# Patient Record
Sex: Female | Born: 1965 | Race: Asian | Hispanic: No | Marital: Married | Smoking: Never smoker
Health system: Southern US, Community
[De-identification: ages and names within clinical notes are randomized; demographics above are authoritative.]

## PROBLEM LIST (undated history)

## (undated) DIAGNOSIS — E079 Disorder of thyroid, unspecified: Secondary | ICD-10-CM

## (undated) DIAGNOSIS — E119 Type 2 diabetes mellitus without complications: Secondary | ICD-10-CM

## (undated) DIAGNOSIS — K760 Fatty (change of) liver, not elsewhere classified: Secondary | ICD-10-CM

## (undated) DIAGNOSIS — I10 Essential (primary) hypertension: Secondary | ICD-10-CM

## (undated) HISTORY — PX: UTERINE FIBROID SURGERY: SHX826

## (undated) HISTORY — DX: Fatty (change of) liver, not elsewhere classified: K76.0

## (undated) HISTORY — PX: CERVICAL SPINE SURGERY: SHX589

## (undated) HISTORY — DX: Essential (primary) hypertension: I10

## (undated) HISTORY — DX: Type 2 diabetes mellitus without complications: E11.9

## (undated) HISTORY — DX: Disorder of thyroid, unspecified: E07.9

---

## 1999-01-16 ENCOUNTER — Other Ambulatory Visit: Admission: RE | Admit: 1999-01-16 | Discharge: 1999-01-16 | Payer: Self-pay | Admitting: Obstetrics & Gynecology

## 1999-05-24 ENCOUNTER — Inpatient Hospital Stay (HOSPITAL_COMMUNITY): Admission: AD | Admit: 1999-05-24 | Discharge: 1999-05-24 | Payer: Self-pay | Admitting: Obstetrics & Gynecology

## 1999-05-27 ENCOUNTER — Inpatient Hospital Stay (HOSPITAL_COMMUNITY): Admission: AD | Admit: 1999-05-27 | Discharge: 1999-06-03 | Payer: Self-pay | Admitting: Obstetrics and Gynecology

## 1999-05-28 ENCOUNTER — Encounter: Payer: Self-pay | Admitting: Obstetrics and Gynecology

## 1999-06-19 ENCOUNTER — Encounter: Payer: Self-pay | Admitting: Emergency Medicine

## 1999-06-19 ENCOUNTER — Emergency Department (HOSPITAL_COMMUNITY): Admission: EM | Admit: 1999-06-19 | Discharge: 1999-06-19 | Payer: Self-pay | Admitting: Emergency Medicine

## 1999-12-27 ENCOUNTER — Ambulatory Visit (HOSPITAL_COMMUNITY): Admission: RE | Admit: 1999-12-27 | Discharge: 1999-12-27 | Payer: Self-pay | Admitting: Obstetrics and Gynecology

## 2003-03-13 ENCOUNTER — Other Ambulatory Visit: Admission: RE | Admit: 2003-03-13 | Discharge: 2003-03-13 | Payer: Self-pay | Admitting: Obstetrics & Gynecology

## 2005-01-20 ENCOUNTER — Other Ambulatory Visit: Admission: RE | Admit: 2005-01-20 | Discharge: 2005-01-20 | Payer: Self-pay | Admitting: Obstetrics & Gynecology

## 2005-02-10 ENCOUNTER — Encounter: Admission: RE | Admit: 2005-02-10 | Discharge: 2005-05-11 | Payer: Self-pay | Admitting: Internal Medicine

## 2014-03-20 LAB — HM COLONOSCOPY

## 2014-05-20 LAB — HM PAP SMEAR

## 2014-05-20 LAB — HM MAMMOGRAPHY

## 2014-10-03 LAB — HM COLONOSCOPY

## 2015-03-13 ENCOUNTER — Telehealth: Payer: Self-pay

## 2015-03-13 NOTE — Telephone Encounter (Signed)
Pre visit call completed 

## 2015-03-14 ENCOUNTER — Encounter: Payer: Self-pay | Admitting: Physician Assistant

## 2015-03-14 ENCOUNTER — Ambulatory Visit (INDEPENDENT_AMBULATORY_CARE_PROVIDER_SITE_OTHER): Payer: BLUE CROSS/BLUE SHIELD | Admitting: Physician Assistant

## 2015-03-14 VITALS — BP 150/97 | HR 65 | Temp 97.9°F | Ht 62.5 in | Wt 194.6 lb

## 2015-03-14 DIAGNOSIS — E119 Type 2 diabetes mellitus without complications: Secondary | ICD-10-CM | POA: Diagnosis not present

## 2015-03-14 DIAGNOSIS — R5383 Other fatigue: Secondary | ICD-10-CM | POA: Diagnosis not present

## 2015-03-14 DIAGNOSIS — I10 Essential (primary) hypertension: Secondary | ICD-10-CM

## 2015-03-14 DIAGNOSIS — M2560 Stiffness of unspecified joint, not elsewhere classified: Secondary | ICD-10-CM | POA: Diagnosis not present

## 2015-03-14 DIAGNOSIS — E038 Other specified hypothyroidism: Secondary | ICD-10-CM

## 2015-03-14 LAB — CBC
HCT: 43.6 % (ref 36.0–46.0)
Hemoglobin: 14.8 g/dL (ref 12.0–15.0)
MCHC: 33.9 g/dL (ref 30.0–36.0)
MCV: 84.7 fl (ref 78.0–100.0)
Platelets: 211 10*3/uL (ref 150.0–400.0)
RBC: 5.15 Mil/uL — ABNORMAL HIGH (ref 3.87–5.11)
RDW: 14.9 % (ref 11.5–15.5)
WBC: 9.3 10*3/uL (ref 4.0–10.5)

## 2015-03-14 LAB — LIPID PANEL
Cholesterol: 160 mg/dL (ref 0–200)
HDL: 58.6 mg/dL (ref >39.00)
LDL Cholesterol: 84 mg/dL (ref 0–99)
NonHDL: 101.4
Total CHOL/HDL Ratio: 3
Triglycerides: 86 mg/dL (ref 0.0–149.0)
VLDL: 17.2 mg/dL (ref 0.0–40.0)

## 2015-03-14 LAB — COMPREHENSIVE METABOLIC PANEL
ALT: 30 U/L (ref 0–35)
AST: 25 U/L (ref 0–37)
Albumin: 3.9 g/dL (ref 3.5–5.2)
Alkaline Phosphatase: 56 U/L (ref 39–117)
BUN: 9 mg/dL (ref 6–23)
CO2: 30 mEq/L (ref 19–32)
Calcium: 8.9 mg/dL (ref 8.4–10.5)
Chloride: 102 mEq/L (ref 96–112)
Creatinine, Ser: 0.75 mg/dL (ref 0.40–1.20)
GFR: 86.54 mL/min (ref >60.00)
Glucose, Bld: 102 mg/dL — ABNORMAL HIGH (ref 70–99)
Potassium: 4.4 mEq/L (ref 3.5–5.1)
Sodium: 136 mEq/L (ref 135–145)
Total Bilirubin: 0.7 mg/dL (ref 0.2–1.2)
Total Protein: 7.6 g/dL (ref 6.0–8.3)

## 2015-03-14 LAB — URINALYSIS, ROUTINE W REFLEX MICROSCOPIC
Bilirubin Urine: NEGATIVE
Hgb urine dipstick: NEGATIVE
Ketones, ur: NEGATIVE
Leukocytes, UA: NEGATIVE
Nitrite: NEGATIVE
Specific Gravity, Urine: >=1.03 — AB (ref 1.000–1.030)
Total Protein, Urine: NEGATIVE
Urine Glucose: NEGATIVE
Urobilinogen, UA: 0.2 (ref 0.0–1.0)
pH: 5.5 (ref 5.0–8.0)

## 2015-03-14 LAB — VITAMIN B12: Vitamin B-12: 548 pg/mL (ref 211–911)

## 2015-03-14 LAB — RHEUMATOID FACTOR: Rhuematoid fact SerPl-aCnc: <10 IU/mL (ref <=14)

## 2015-03-14 LAB — VITAMIN D 25 HYDROXY (VIT D DEFICIENCY, FRACTURES): VITD: 20.09 ng/mL — ABNORMAL LOW (ref 30.00–100.00)

## 2015-03-14 LAB — HEMOGLOBIN A1C: Hgb A1c MFr Bld: 5.9 % (ref 4.6–6.5)

## 2015-03-14 LAB — TSH: TSH: 4.92 u[IU]/mL — ABNORMAL HIGH (ref 0.35–4.50)

## 2015-03-14 MED ORDER — METFORMIN HCL 500 MG PO TABS: 500.0000 mg | ORAL_TABLET | Freq: Two times a day (BID) | ORAL | Status: DC

## 2015-03-14 MED ORDER — LISINOPRIL-HYDROCHLOROTHIAZIDE 10-12.5 MG PO TABS: 1.0000 | ORAL_TABLET | Freq: Every day | ORAL | Status: DC

## 2015-03-14 NOTE — Progress Notes (Signed)
Pre visit review using our clinic review tool, if applicable. No additional management support is needed unless otherwise documented below in the visit note. 

## 2015-03-14 NOTE — Patient Instructions (Addendum)
Please go to the lab for blood work.  I will call you with your results. Please increase nighttime Gabapentin dose to 600 mg.  Continue other dosing the same.   We will alter your regimen based on lab results.  Please eat a healthy meal or snack every 4 hours. Stay well hydrated. Exercise when you can.  Diabetes and Exercise Exercising regularly is important. It is not just about losing weight. It has many health benefits, such as:  Improving your overall fitness, flexibility, and endurance.  Increasing your bone density.  Helping with weight control.  Decreasing your body fat.  Increasing your muscle strength.  Reducing stress and tension.  Improving your overall health. People with diabetes who exercise gain additional benefits because exercise:  Reduces appetite.  Improves the body's use of blood sugar (glucose).  Helps lower or control blood glucose.  Decreases blood pressure.  Helps control blood lipids (such as cholesterol and triglycerides).  Improves the body's use of the hormone insulin by:  Increasing the body's insulin sensitivity.  Reducing the body's insulin needs.  Decreases the risk for heart disease because exercising:  Lowers cholesterol and triglycerides levels.  Increases the levels of good cholesterol (such as high-density lipoproteins [HDL]) in the body.  Lowers blood glucose levels. YOUR ACTIVITY PLAN  Choose an activity that you enjoy and set realistic goals. Your health care provider or diabetes educator can help you make an activity plan that works for you. Exercise regularly as directed by your health care provider. This includes:  Performing resistance training twice a week such as push-ups, sit-ups, lifting weights, or using resistance bands.  Performing 150 minutes of cardio exercises each week such as walking, running, or playing sports.  Staying active and spending no more than 90 minutes at one time being inactive. Even short  bursts of exercise are good for you. Three 10-minute sessions spread throughout the day are just as beneficial as a single 30-minute session. Some exercise ideas include:  Taking the dog for a walk.  Taking the stairs instead of the elevator.  Dancing to your favorite song.  Doing an exercise video.  Doing your favorite exercise with a friend. RECOMMENDATIONS FOR EXERCISING WITH TYPE 1 OR TYPE 2 DIABETES   Check your blood glucose before exercising. If blood glucose levels are greater than 240 mg/dL, check for urine ketones. Do not exercise if ketones are present.  Avoid injecting insulin into areas of the body that are going to be exercised. For example, avoid injecting insulin into:  The arms when playing tennis.  The legs when jogging.  Keep a record of:  Food intake before and after you exercise.  Expected peak times of insulin action.  Blood glucose levels before and after you exercise.  The type and amount of exercise you have done.  Review your records with your health care provider. Your health care provider will help you to develop guidelines for adjusting food intake and insulin amounts before and after exercising.  If you take insulin or oral hypoglycemic agents, watch for signs and symptoms of hypoglycemia. They include:  Dizziness.  Shaking.  Sweating.  Chills.  Confusion.  Drink plenty of water while you exercise to prevent dehydration or heat stroke. Body water is lost during exercise and must be replaced.  Talk to your health care provider before starting an exercise program to make sure it is safe for you. Remember, almost any type of activity is better than none. Document Released: 12/27/2003 Document Revised:  02/20/2014 Document Reviewed: 03/15/2013 ExitCare Patient Information 2015 [place] [place]. This information is not intended to replace advice given to you by your health care provider. Make sure you discuss any questions you have with your  health care provider.

## 2015-03-15 MED ORDER — METFORMIN HCL 500 MG PO TABS: 500.0000 mg | ORAL_TABLET | Freq: Every day | ORAL | Status: DC

## 2015-03-15 MED ORDER — LEVOTHYROXINE SODIUM 125 MCG PO TABS: 125.0000 ug | ORAL_TABLET | Freq: Every day | ORAL | Status: DC

## 2015-03-16 ENCOUNTER — Telehealth: Payer: Self-pay | Admitting: *Deleted

## 2015-03-16 DIAGNOSIS — E039 Hypothyroidism, unspecified: Secondary | ICD-10-CM

## 2015-03-16 MED ORDER — GLUCOSE BLOOD VI STRP: ORAL_STRIP | Status: DC

## 2015-03-16 NOTE — Telephone Encounter (Signed)
Called and spoke with the pt and informed her of note below.  Pt verbalized understanding.  Scheduled pt for lab appt on (Friday 05-04-15 @ 8:15am) for repeat TSH, and follow-up appt on (Mon 06-18-15 @ 7:00am).//AB/CMA

## 2015-03-16 NOTE — Telephone Encounter (Signed)
Per Cody-Would recommend we give the new thyroid medication dose a month or so to see if weight starts coming off since underactive thyroid was contributing to weight issues. Will reassess at follow-up.

## 2015-03-19 DIAGNOSIS — M2560 Stiffness of unspecified joint, not elsewhere classified: Secondary | ICD-10-CM | POA: Insufficient documentation

## 2015-03-19 DIAGNOSIS — E038 Other specified hypothyroidism: Secondary | ICD-10-CM | POA: Insufficient documentation

## 2015-03-19 DIAGNOSIS — E119 Type 2 diabetes mellitus without complications: Secondary | ICD-10-CM | POA: Insufficient documentation

## 2015-03-19 DIAGNOSIS — R5383 Other fatigue: Secondary | ICD-10-CM | POA: Insufficient documentation

## 2015-03-19 DIAGNOSIS — I10 Essential (primary) hypertension: Secondary | ICD-10-CM | POA: Insufficient documentation

## 2015-03-19 DIAGNOSIS — E039 Hypothyroidism, unspecified: Secondary | ICD-10-CM | POA: Insufficient documentation

## 2015-03-19 NOTE — Assessment & Plan Note (Signed)
Will obtain CBC and Vitamin D in addition to assessing thyroid status.

## 2015-03-19 NOTE — Assessment & Plan Note (Signed)
With mild neuropathy.  Will check BMP, A1C, UA, B12 level today. Will continue current Metformin regimen until results are obtained. Will increase QHS dosing of Gabapentin to 600 mg. Follow-up with Ophthalmology as scheduled.  Foot exam deferred until next visit.

## 2015-03-19 NOTE — Assessment & Plan Note (Signed)
Will obtain RF, CBC and CMP today.

## 2015-03-19 NOTE — Progress Notes (Signed)
Patient presents to clinic today to establish care.  Patient c/o difficulty with weight loss despite diet and exercise.  Notes this has been a problem over the past 6+ months.  Also endorses mild constipation and dry skin.  Has a history of hypothyroidism, taking levothyroxine 125 mcg daily.  Endorses it has been 1+ years since TSH was evaluated. Would also like to discuss medication options for weight loss.  Chronic Issues: Hypertension -- Endorses well-controlled with lisinopril-HCTZ daily. Patient denies chest pain, palpitations, lightheadedness, dizziness, vision changes or frequent headaches.  Diabetes Mellitus II with neuropathy -- Endorses sugars well controlled with Metformin 500 mg BID. Has been 1+ years since last A1C check.  Is not currently checking fasting CBGs.  Has appointment scheduled with Ophthalmology.  Denies history of diabetic retinopathy or nephropathy.  Endorses neuropathic pain that has worsened over the past few months.  Is taking Gabapentin 300 mg TID.  Past Medical History  Diagnosis Date  . Thyroid disease   . Hypertension   . Diabetes mellitus without complication     Current Outpatient Prescriptions on File Prior to Visit  Medication Sig Dispense Refill  . Calcium Carbonate-Vit D-Min (CALCIUM 1200 PO) Take by mouth.    . gabapentin (NEURONTIN) 300 MG capsule Take 300 mg by mouth 3 (three) times daily.    . Omega-3 Fatty Acids (FISH OIL) 1000 MG CAPS Take by mouth.     No current facility-administered medications on file prior to visit.    No Known Allergies  Family History  Problem Relation Age of Onset  . Kidney disease Mother     Kidney Failure  . Heart disease Father     MI    History   Social History  . Marital Status: Married    Spouse Name: N/A  . Number of Children: N/A  . Years of Education: N/A   Social History Main Topics  . Smoking status: Never Smoker   . Smokeless tobacco: Not on file  . Alcohol Use: Not on file  . Drug  Use: No  . Sexual Activity: Not on file   Other Topics Concern  . None   Social History Narrative   Review of Systems  Constitutional: Negative for fever, weight loss and malaise/fatigue.  HENT: Negative for hearing loss and tinnitus.   Eyes: Negative for blurred vision, double vision, photophobia and pain.  Respiratory: Negative for cough.   Cardiovascular: Negative for chest pain and palpitations.  Musculoskeletal: Positive for joint pain.  Neurological: Positive for tingling. Negative for dizziness, loss of consciousness and headaches.  Psychiatric/Behavioral: Negative for depression, suicidal ideas, hallucinations and substance abuse. The patient is not nervous/anxious and does not have insomnia.    BP 150/97 mmHg  Pulse 65  Temp(Src) 97.9 F (36.6 C) (Oral)  Ht 5' 2.5" (1.588 m)  Wt 194 lb 9.6 oz (88.27 kg)  BMI 35.00 kg/m2  SpO2 100%  Physical Exam  Constitutional: She is oriented to person, place, and time and well-developed, well-nourished, and in no distress.  HENT:  Head: Normocephalic and atraumatic.  Right Ear: External ear normal.  Left Ear: External ear normal.  Nose: Nose normal.  Mouth/Throat: Oropharynx is clear and moist. No oropharyngeal exudate.  TM within normal limits bilaterally.  Eyes: Conjunctivae are normal. Pupils are equal, round, and reactive to light.  Neck: Neck supple. No thyromegaly present.  Cardiovascular: Normal rate, regular rhythm, normal heart sounds and intact distal pulses.   Pulmonary/Chest: Effort normal and breath sounds normal.  No respiratory distress. She has no wheezes. She has no rales. She exhibits no tenderness.  Lymphadenopathy:    She has no cervical adenopathy.  Neurological: She is alert and oriented to person, place, and time. No cranial nerve deficit.  Skin: Skin is warm and dry. No rash noted.  Psychiatric: Affect normal.  Vitals reviewed.   Recent Results (from the past 2160 hour(s))  CBC     Status: Abnormal     Collection Time: 03/14/15  9:19 AM  Result Value Ref Range   WBC 9.3 4.0 - 10.5 K/uL   RBC 5.15 (H) 3.87 - 5.11 Mil/uL   Platelets 211.0 150.0 - 400.0 K/uL   Hemoglobin 14.8 12.0 - 15.0 g/dL   HCT 43.6 36.0 - 46.0 %   MCV 84.7 78.0 - 100.0 fl   MCHC 33.9 30.0 - 36.0 g/dL   RDW 14.9 11.5 - 15.5 %  Hemoglobin A1c     Status: None   Collection Time: 03/14/15  9:19 AM  Result Value Ref Range   Hgb A1c MFr Bld 5.9 4.6 - 6.5 %    Comment: Glycemic Control Guidelines for People with Diabetes:Non Diabetic:  <6%Goal of Therapy: <7%Additional Action Suggested:  >8%   Urinalysis, Routine w reflex microscopic     Status: Abnormal   Collection Time: 03/14/15  9:19 AM  Result Value Ref Range   Color, Urine YELLOW Yellow;Lt. Yellow   APPearance CLEAR Clear   Specific Gravity, Urine >=1.030 (A) 1.000 - 1.030   pH 5.5 5.0 - 8.0   Total Protein, Urine NEGATIVE Negative   Urine Glucose NEGATIVE Negative   Ketones, ur NEGATIVE Negative   Bilirubin Urine NEGATIVE Negative   Hgb urine dipstick NEGATIVE Negative   Urobilinogen, UA 0.2 0.0 - 1.0   Leukocytes, UA NEGATIVE Negative   Nitrite NEGATIVE Negative   RBC / HPF 0-2/hpf 0-2/hpf   Squamous Epithelial / LPF Rare(0-4/hpf) Rare(0-4/hpf)  Rheumatoid Factor     Status: None   Collection Time: 03/14/15  9:19 AM  Result Value Ref Range   Rhuematoid fact SerPl-aCnc <10 <=14 IU/mL    Comment:                            Interpretive Table                     Low Positive: 15 - 41 IU/mL                     High Positive:  >= 42 IU/mL    In addition to the RF result, and clinical symptoms including joint  involvement, the 2010 ACR Classification Criteria for  scoring/diagnosing Rheumatoid Arthritis include the results of the  following tests:  CRP (91478), ESR (15010), and CCP (APCA) (29562).  www.rheumatology.org/practice/clinical/classification/ra/ra_2010.asp   Comprehensive metabolic panel     Status: Abnormal   Collection Time: 03/14/15   9:19 AM  Result Value Ref Range   Sodium 136 135 - 145 mEq/L   Potassium 4.4 3.5 - 5.1 mEq/L   Chloride 102 96 - 112 mEq/L   CO2 30 19 - 32 mEq/L   Glucose, Bld 102 (H) 70 - 99 mg/dL   BUN 9 6 - 23 mg/dL   Creatinine, Ser 0.75 0.40 - 1.20 mg/dL   Total Bilirubin 0.7 0.2 - 1.2 mg/dL   Alkaline Phosphatase 56 39 - 117 U/L   AST 25 0 - 37 U/L  ALT 30 0 - 35 U/L   Total Protein 7.6 6.0 - 8.3 g/dL   Albumin 3.9 3.5 - 5.2 g/dL   Calcium 8.9 8.4 - 10.5 mg/dL   GFR 86.54 >60.00 mL/min  TSH     Status: Abnormal   Collection Time: 03/14/15  9:19 AM  Result Value Ref Range   TSH 4.92 (H) 0.35 - 4.50 uIU/mL  Lipid panel     Status: None   Collection Time: 03/14/15  9:19 AM  Result Value Ref Range   Cholesterol 160 0 - 200 mg/dL    Comment: ATP III Classification       Desirable:  < 200 mg/dL               Borderline High:  200 - 239 mg/dL          High:  > = 240 mg/dL   Triglycerides 86.0 0.0 - 149.0 mg/dL    Comment: Normal:  <150 mg/dLBorderline High:  150 - 199 mg/dL   HDL 58.60 >39.00 mg/dL   VLDL 17.2 0.0 - 40.0 mg/dL   LDL Cholesterol 84 0 - 99 mg/dL   Total CHOL/HDL Ratio 3     Comment:                Men          Women1/2 Average Risk     3.4          3.3Average Risk          5.0          4.42X Average Risk          9.6          7.13X Average Risk          15.0          11.0                       NonHDL 101.40     Comment: NOTE:  Non-HDL goal should be 30 mg/dL higher than patient's LDL goal (i.e. LDL goal of < 70 mg/dL, would have non-HDL goal of < 100 mg/dL)  Vit D  25 hydroxy (rtn osteoporosis monitoring)     Status: Abnormal   Collection Time: 03/14/15  9:19 AM  Result Value Ref Range   VITD 20.09 (L) 30.00 - 100.00 ng/mL  Vitamin B12     Status: None   Collection Time: 03/14/15  9:19 AM  Result Value Ref Range   Vitamin B-12 548 211 - 911 pg/mL    Assessment/Plan: Diabetes mellitus type II, controlled With mild neuropathy.  Will check BMP, A1C, UA, B12 level today.  Will continue current Metformin regimen until results are obtained. Will increase QHS dosing of Gabapentin to 600 mg. Follow-up with Ophthalmology as scheduled.  Foot exam deferred until next visit.   Joint stiffness Will obtain RF, CBC and CMP today.   Essential hypertension Out of medication.  Medications refilled.  DASH diet discussed and handout given.  Will check CMP and Lipid panel today.   Other specified hypothyroidism Patient with current symptoms to suggest her thyroid hormone level is low at current dose of levothyroxine.  Will obtain TSH to assess.  If dose increase needed, will do so and repeat TSH in 6 weeks.  If normal will discuss options to help metabolism and promote weight loss.  Supportive measures discussed with patient.   Other fatigue Will obtain CBC and Vitamin D in  addition to assessing thyroid status.

## 2015-03-19 NOTE — Assessment & Plan Note (Signed)
Out of medication.  Medications refilled.  DASH diet discussed and handout given.  Will check CMP and Lipid panel today.

## 2015-03-19 NOTE — Assessment & Plan Note (Signed)
Patient with current symptoms to suggest her thyroid hormone level is low at current dose of levothyroxine.  Will obtain TSH to assess.  If dose increase needed, will do so and repeat TSH in 6 weeks.  If normal will discuss options to help metabolism and promote weight loss.  Supportive measures discussed with patient.

## 2015-05-04 ENCOUNTER — Other Ambulatory Visit: Payer: BLUE CROSS/BLUE SHIELD

## 2015-05-07 ENCOUNTER — Other Ambulatory Visit (INDEPENDENT_AMBULATORY_CARE_PROVIDER_SITE_OTHER): Payer: BLUE CROSS/BLUE SHIELD

## 2015-05-07 DIAGNOSIS — E039 Hypothyroidism, unspecified: Secondary | ICD-10-CM | POA: Diagnosis not present

## 2015-05-07 LAB — TSH: TSH: 0.09 u[IU]/mL — ABNORMAL LOW (ref 0.35–4.50)

## 2015-05-09 ENCOUNTER — Telehealth: Payer: Self-pay

## 2015-05-09 MED ORDER — LEVOTHYROXINE SODIUM 112 MCG PO TABS: 112.0000 ug | ORAL_TABLET | Freq: Every day | ORAL | Status: DC

## 2015-05-09 NOTE — Telephone Encounter (Signed)
Can discuss phentermine for weight loss but hypertension needs to be well controlled. She needs repeat assessment and BP check before we can start a medication like this.

## 2015-05-09 NOTE — Telephone Encounter (Signed)
Pt notified and verbalized concerns with weight and would like to know if there is something that she can take to help. Please advise.

## 2015-05-09 NOTE — Telephone Encounter (Signed)
-----   Message from Waldon MerlWilliam C Martin, PA-C sent at 05/07/2015  1:00 PM EDT ----- TSH is too low indicating new dose of thyroid medication is likely too much. This is not typical with such a small increase. Want to change dose to 112 mcg tablet daily as her original dose (100 mcg) was not strong enough. Ok to send in Rx with 30 tablet 2 refills. Want to see her in office in 6 weeks.

## 2015-05-14 NOTE — Telephone Encounter (Signed)
Pt informed of instructions appt booked for Fri to recheck BP.

## 2015-05-18 ENCOUNTER — Ambulatory Visit: Payer: BLUE CROSS/BLUE SHIELD | Admitting: Physician Assistant

## 2015-05-21 ENCOUNTER — Ambulatory Visit (INDEPENDENT_AMBULATORY_CARE_PROVIDER_SITE_OTHER): Payer: BLUE CROSS/BLUE SHIELD | Admitting: Medical

## 2015-05-21 ENCOUNTER — Encounter: Payer: Self-pay | Admitting: Medical

## 2015-05-21 VITALS — BP 148/88 | HR 72 | Temp 98.6°F | Ht 62.5 in | Wt 201.6 lb

## 2015-05-21 DIAGNOSIS — E038 Other specified hypothyroidism: Secondary | ICD-10-CM

## 2015-05-21 DIAGNOSIS — I10 Essential (primary) hypertension: Secondary | ICD-10-CM | POA: Diagnosis not present

## 2015-05-21 DIAGNOSIS — E669 Obesity, unspecified: Secondary | ICD-10-CM

## 2015-05-21 MED ORDER — LORCASERIN HCL 10 MG PO TABS: ORAL_TABLET | ORAL | Status: DC

## 2015-05-21 MED ORDER — LEVOTHYROXINE SODIUM 112 MCG PO TABS: 112.0000 ug | ORAL_TABLET | Freq: Every day | ORAL | Status: DC

## 2015-05-21 NOTE — Assessment & Plan Note (Signed)
Will rx her thyroid med at the 112 mcg dose. Give her the printed rx.

## 2015-05-21 NOTE — Assessment & Plan Note (Addendum)
Continue her bp med at same dose. Want her to keep bp log at home. Write numbers down and bring in bp reading from home in  2 wks.

## 2015-05-21 NOTE — Assessment & Plan Note (Signed)
Diet and exercise. Rx belvique. Should see some weight loss by 2 wks. Follow up in 2 wks.

## 2015-05-21 NOTE — Progress Notes (Signed)
Subjective:    Patient ID: Lindsey Owen, female    DOB: August 14, 1966, 49 y.o.   MRN: [id number]  HPI  Pt in for follow up. Pt wants to loose weight. Pt is diabetic. On metformin. Last a1-c was good.  Pt has hx of low thyroid should be on the 112 mcg dose. But that was not called to her pharmacy. So will give print out rx.   Pt has been trying to loose weight and exercise. She did not have success with any diets in past. Pt bmi is 36.4. Pt tried 1500 calorie diet in the past. Pt does walk.   Pt bp is usually 130-80 range at home when she checks. No cardiac or neurologic signs or symptoms.     Review of Systems  Constitutional: Negative for fever, chills, diaphoresis, activity change and fatigue.  Respiratory: Negative for cough, chest tightness and shortness of breath.   Cardiovascular: Negative for chest pain, palpitations and leg swelling.  Gastrointestinal: Negative for nausea, vomiting and abdominal pain.  Musculoskeletal: Negative for neck pain and neck stiffness.  Neurological: Negative for dizziness, tremors, seizures, syncope, facial asymmetry, speech difficulty, weakness, light-headedness, numbness and headaches.  Psychiatric/Behavioral: Negative for behavioral problems, confusion and agitation. The patient is not nervous/anxious.    Past Medical History  Diagnosis Date  . Thyroid disease   . Hypertension   . Diabetes mellitus without complication     History   Social History  . Marital Status: Married    Spouse Name: N/A  . Number of Children: N/A  . Years of Education: N/A   Occupational History  . Not on file.   Social History Main Topics  . Smoking status: Never Smoker   . Smokeless tobacco: Not on file  . Alcohol Use: Not on file  . Drug Use: No  . Sexual Activity: Not on file   Other Topics Concern  . Not on file   Social History Narrative    Past Surgical History  Procedure Laterality Date  . Cesarean section  2000  . Uterine fibroid  surgery      Family History  Problem Relation Age of Onset  . Kidney disease Mother     Kidney Failure  . Heart disease Father     MI    No Known Allergies  Current Outpatient Prescriptions on File Prior to Visit  Medication Sig Dispense Refill  . Calcium Carbonate-Vit D-Min (CALCIUM 1200 PO) Take by mouth.    . gabapentin (NEURONTIN) 300 MG capsule Take 300 mg by mouth 3 (three) times daily.    Marland Kitchen glucose blood test strip CONTOUR BLOOD GLUCOSE TEST STRIP.  USE ONE STRIP TO CHECK GLUCOSE TWICE DAILY. 100 each 12  . levothyroxine (SYNTHROID, LEVOTHROID) 112 MCG tablet Take 1 tablet (112 mcg total) by mouth daily. 30 tablet 2  . lisinopril-hydrochlorothiazide (PRINZIDE,ZESTORETIC) 10-12.5 MG per tablet Take 1 tablet by mouth daily. 90 tablet 1  . metFORMIN (GLUCOPHAGE) 500 MG tablet Take 1 tablet (500 mg total) by mouth daily with breakfast. 90 tablet 1  . Omega-3 Fatty Acids (FISH OIL) 1000 MG CAPS Take by mouth.     No current facility-administered medications on file prior to visit.    BP 135/89 mmHg  Pulse 72  Temp(Src) 98.6 F (37 C) (Oral)  Ht 5' 2.5" (1.588 m)  Wt 201 lb 9.6 oz (91.445 kg)  BMI 36.26 kg/m2  SpO2 99%       Objective:   Physical Exam  General Mental  Status- Alert. General Appearance- Not in acute distress.   Skin General: Color- Normal Color. Moisture- Normal Moisture.  Neck Carotid Arteries- Normal color. Moisture- Normal Moisture. No carotid bruits. No JVD.  Chest and Lung Exam Auscultation: Breath Sounds:-Normal. CTA.  Cardiovascular Auscultation:Rythm- Regular, Rate and Rhythm. Murmurs & Other Heart Sounds:Auscultation of the heart reveals- No Murmurs.  Abdomen Inspection:-Inspeection Normal. Palpation/Percussion:Note:No mass. Palpation and Percussion of the abdomen reveal- Non Tender, Non Distended + BS, no rebound or guarding.    Neurologic Cranial Nerve exam:- CN III-XII intact(No nystagmus), symmetric smile. Strength:- 5/5  equal and symmetric strength both upper and lower extremities.      Assessment & Plan:

## 2015-05-21 NOTE — Progress Notes (Signed)
Pre visit review using our clinic review tool, if applicable. No additional management support is needed unless otherwise documented below in the visit note. 

## 2015-05-21 NOTE — Patient Instructions (Addendum)
Other specified hypothyroidism Will rx her thyroid med at the 112 mcg dose. Give her the printed rx.   Essential hypertension Continue her bp med at same dose. Want her to keep bp log at home. Write numbers down and bring in 2 wks.  Obesity Diet and exercise. Rx belvique. Should see some weight loss by 2 wks. Follow up in 2 wks.    Follow up in 2 wks or as needed

## 2015-05-23 ENCOUNTER — Telehealth: Payer: Self-pay | Admitting: Physician Assistant

## 2015-05-23 NOTE — Telephone Encounter (Signed)
Pt was no show 05/18/15 2:15pm, pt rescheduled and came in 05/21/15 with Ramon Dredge, charge for no show?

## 2015-05-23 NOTE — Telephone Encounter (Signed)
No charge. 

## 2015-05-29 ENCOUNTER — Telehealth: Payer: Self-pay | Admitting: Physician Assistant

## 2015-05-29 NOTE — Telephone Encounter (Signed)
Relation to pt: self  Call back number: [phone] Pharmacy: [facility] [address] [place] - [address] [phone] (Phone) [phone] (Fax)        v  Reason for call:  As per pharmacy medication in need of prior-auth Lorcaserin HCl 10 MG TABS

## 2015-06-18 ENCOUNTER — Encounter: Payer: Self-pay | Admitting: Physician Assistant

## 2015-06-18 ENCOUNTER — Ambulatory Visit: Payer: BLUE CROSS/BLUE SHIELD | Admitting: Physician Assistant

## 2015-06-18 ENCOUNTER — Ambulatory Visit (INDEPENDENT_AMBULATORY_CARE_PROVIDER_SITE_OTHER): Payer: BLUE CROSS/BLUE SHIELD | Admitting: Physician Assistant

## 2015-06-18 VITALS — BP 150/96 | HR 72 | Temp 98.4°F | Resp 16 | Ht 62.5 in | Wt 198.2 lb

## 2015-06-18 DIAGNOSIS — J209 Acute bronchitis, unspecified: Secondary | ICD-10-CM

## 2015-06-18 DIAGNOSIS — H66002 Acute suppurative otitis media without spontaneous rupture of ear drum, left ear: Secondary | ICD-10-CM

## 2015-06-18 DIAGNOSIS — H66009 Acute suppurative otitis media without spontaneous rupture of ear drum, unspecified ear: Secondary | ICD-10-CM | POA: Insufficient documentation

## 2015-06-18 MED ORDER — FLUTICASONE PROPIONATE 50 MCG/ACT NA SUSP: 2.0000 | Freq: Every day | NASAL | Status: DC

## 2015-06-18 MED ORDER — AMOXICILLIN-POT CLAVULANATE 875-125 MG PO TABS: 1.0000 | ORAL_TABLET | Freq: Two times a day (BID) | ORAL | Status: DC

## 2015-06-18 NOTE — Progress Notes (Signed)
Patient presents to clinic today c/o 4 days fo rapidly worsening chest congestion with dry cough, head congestion without sinus pain and significant L ear pain and pressure radiating into jaw. Denies fever, chills, SOB or chest pain. Denies recent travel.   Past Medical History  Diagnosis Date  . Thyroid disease   . Hypertension   . Diabetes mellitus without complication     Current Outpatient Prescriptions on File Prior to Visit  Medication Sig Dispense Refill  . Calcium Carbonate-Vit D-Min (CALCIUM 1200 PO) Take by mouth.    . gabapentin (NEURONTIN) 300 MG capsule Take 300 mg by mouth 3 (three) times daily.    Marland Kitchen glucose blood test strip CONTOUR BLOOD GLUCOSE TEST STRIP.  USE ONE STRIP TO CHECK GLUCOSE TWICE DAILY. 100 each 12  . levothyroxine (SYNTHROID, LEVOTHROID) 112 MCG tablet Take 1 tablet (112 mcg total) by mouth daily. 30 tablet 2  . lisinopril-hydrochlorothiazide (PRINZIDE,ZESTORETIC) 10-12.5 MG per tablet Take 1 tablet by mouth daily. 90 tablet 1  . Lorcaserin HCl 10 MG TABS 1 tab po q day 30 tablet 0  . metFORMIN (GLUCOPHAGE) 500 MG tablet Take 1 tablet (500 mg total) by mouth daily with breakfast. 90 tablet 1  . Omega-3 Fatty Acids (FISH OIL) 1000 MG CAPS Take by mouth.     No current facility-administered medications on file prior to visit.    No Known Allergies  Family History  Problem Relation Age of Onset  . Kidney disease Mother     Kidney Failure  . Heart disease Father     MI    Social History   Social History  . Marital Status: Married    Spouse Name: N/A  . Number of Children: N/A  . Years of Education: N/A   Social History Main Topics  . Smoking status: Never Smoker   . Smokeless tobacco: None  . Alcohol Use: None  . Drug Use: No  . Sexual Activity: Not Asked   Other Topics Concern  . None   Social History Narrative   Review of Systems - See HPI.  All other ROS are negative.  BP 150/96 mmHg  Pulse 72  Temp(Src) 98.4 F (36.9 C)  (Oral)  Resp 16  Ht 5' 2.5" (1.588 m)  Wt 198 lb 4 oz (89.926 kg)  BMI 35.66 kg/m2  SpO2 95%  Physical Exam  Constitutional: She is oriented to person, place, and time and well-developed, well-nourished, and in no distress.  HENT:  Head: Normocephalic and atraumatic.  Right Ear: External ear normal.  Left Ear: External ear normal. Tympanic membrane is erythematous and bulging. A middle ear effusion is present.  Nose: Nose normal.  Mouth/Throat: Oropharynx is clear and moist. No oropharyngeal exudate.  Eyes: Conjunctivae are normal.  Neck: Neck supple.  Cardiovascular: Normal rate, regular rhythm, normal heart sounds and intact distal pulses.   Pulmonary/Chest: Effort normal and breath sounds normal. No respiratory distress. She has no wheezes. She has no rales. She exhibits no tenderness.  Neurological: She is alert and oriented to person, place, and time.  Skin: Skin is warm and dry. No rash noted.  Vitals reviewed.   Recent Results (from the past 2160 hour(s))  TSH     Status: Abnormal   Collection Time: 05/07/15  8:23 AM  Result Value Ref Range   TSH 0.09 (L) 0.35 - 4.50 uIU/mL    Assessment/Plan: Acute bronchitis Likely viral but with bacterial AOM. Rx Augmentin for AOM. Increase fluids. Mucinex-DM. Humidifier in  bedroom and rest.  Acute purulent otitis media Rx Augmentin. Rx Flonase to take daily as directed. Supportive measures reviewed. Follow-up PRN if symptoms are not improving.

## 2015-06-18 NOTE — Assessment & Plan Note (Signed)
Rx Augmentin. Rx Flonase to take daily as directed. Supportive measures reviewed. Follow-up PRN if symptoms are not improving.

## 2015-06-18 NOTE — Patient Instructions (Signed)
Please take the antibiotic as directed. Stay well hydrated and get plenty of rest. Use Mucinex - DM twice daily for congestion and cough. Use Flonase daily as directed. Place a humidifier in the bedroom. Call or return to clinic if symptoms are not resolving.

## 2015-06-18 NOTE — Progress Notes (Signed)
Pre visit review using our clinic review tool, if applicable. No additional management support is needed unless otherwise documented below in the visit note/SLS  

## 2015-06-18 NOTE — Assessment & Plan Note (Signed)
Likely viral but with bacterial AOM. Rx Augmentin for AOM. Increase fluids. Mucinex-DM. Humidifier in bedroom and rest.

## 2015-07-27 ENCOUNTER — Telehealth: Payer: Self-pay | Admitting: Physician Assistant

## 2015-07-27 DIAGNOSIS — E119 Type 2 diabetes mellitus without complications: Secondary | ICD-10-CM

## 2015-07-27 DIAGNOSIS — E038 Other specified hypothyroidism: Secondary | ICD-10-CM

## 2015-07-27 DIAGNOSIS — I10 Essential (primary) hypertension: Secondary | ICD-10-CM

## 2015-07-27 MED ORDER — LISINOPRIL-HYDROCHLOROTHIAZIDE 10-12.5 MG PO TABS: 1.0000 | ORAL_TABLET | Freq: Every day | ORAL | Status: DC

## 2015-07-27 MED ORDER — METFORMIN HCL 500 MG PO TABS: 500.0000 mg | ORAL_TABLET | Freq: Every day | ORAL | Status: DC

## 2015-07-27 MED ORDER — LEVOTHYROXINE SODIUM 112 MCG PO TABS: 112.0000 ug | ORAL_TABLET | Freq: Every day | ORAL | Status: DC

## 2015-07-27 NOTE — Telephone Encounter (Signed)
Please send to [facility] on [address]

## 2015-07-27 NOTE — Telephone Encounter (Signed)
Refills sent in. As far as thyroid medication goes -- she is overdue for repeat TSH to make sure she doesn't need a different dose of medication. Needs to return to lab for TSH to assess.

## 2015-07-27 NOTE — Telephone Encounter (Signed)
Pt called for meds to be refilled early. She said she just had them filled recently and she went out of town and left them at hotel. She called hotel and they told her they didn't find them. She states she needs lisinopril, metformin, and synthroid (she said the one she is taking doesn't work good and asked if there is a different thyroid med she can take).   Best # [phone] She is at hospital with her husband

## 2015-07-31 ENCOUNTER — Other Ambulatory Visit: Payer: BLUE CROSS/BLUE SHIELD

## 2015-07-31 NOTE — Telephone Encounter (Signed)
Patient informed, understood & agreed; lab appt scheduled 10.12.16 at 8:15a; new lab order placed/SLS

## 2015-08-01 ENCOUNTER — Other Ambulatory Visit (INDEPENDENT_AMBULATORY_CARE_PROVIDER_SITE_OTHER): Payer: BLUE CROSS/BLUE SHIELD

## 2015-08-01 DIAGNOSIS — E038 Other specified hypothyroidism: Secondary | ICD-10-CM | POA: Diagnosis not present

## 2015-08-01 LAB — TSH: TSH: 3.48 u[IU]/mL (ref 0.35–4.50)

## 2015-08-24 ENCOUNTER — Ambulatory Visit (INDEPENDENT_AMBULATORY_CARE_PROVIDER_SITE_OTHER)
Admission: RE | Admit: 2015-08-24 | Discharge: 2015-08-24 | Disposition: A | Discharge status: Home / Self Care | Payer: BLUE CROSS/BLUE SHIELD | Source: Ambulatory Visit | Attending: Internal Medicine | Admitting: Internal Medicine

## 2015-08-24 ENCOUNTER — Ambulatory Visit (INDEPENDENT_AMBULATORY_CARE_PROVIDER_SITE_OTHER): Payer: BLUE CROSS/BLUE SHIELD | Admitting: Internal Medicine

## 2015-08-24 ENCOUNTER — Encounter: Payer: Self-pay | Admitting: Internal Medicine

## 2015-08-24 VITALS — BP 150/100 | HR 77 | Wt 203.0 lb

## 2015-08-24 DIAGNOSIS — M25562 Pain in left knee: Secondary | ICD-10-CM | POA: Diagnosis not present

## 2015-08-24 DIAGNOSIS — R0789 Other chest pain: Secondary | ICD-10-CM | POA: Diagnosis not present

## 2015-08-24 DIAGNOSIS — W1809XA Striking against other object with subsequent fall, initial encounter: Secondary | ICD-10-CM

## 2015-08-24 IMAGING — DX DG KNEE COMPLETE 4+V*R*
4 series · 4 of 4 positions shown · non-contrast
Comparison: None.

CLINICAL DATA: Pain and swelling in the right knee after recent
fall.

EXAM:
RIGHT KNEE - COMPLETE 4+ VIEW

[knee ap]
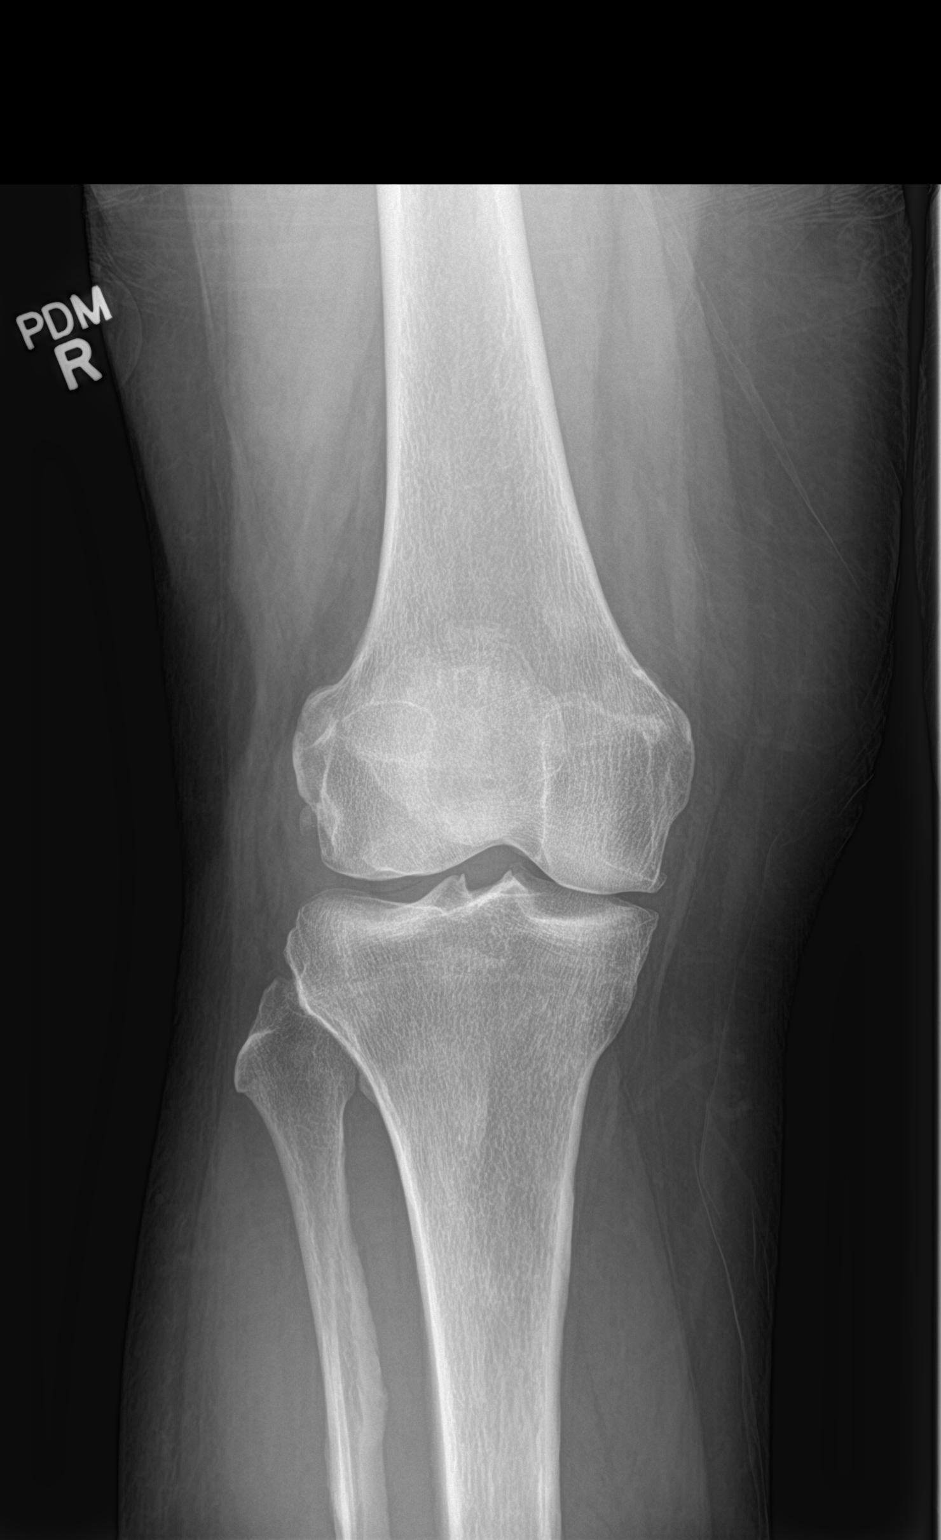

[knee tunnel]
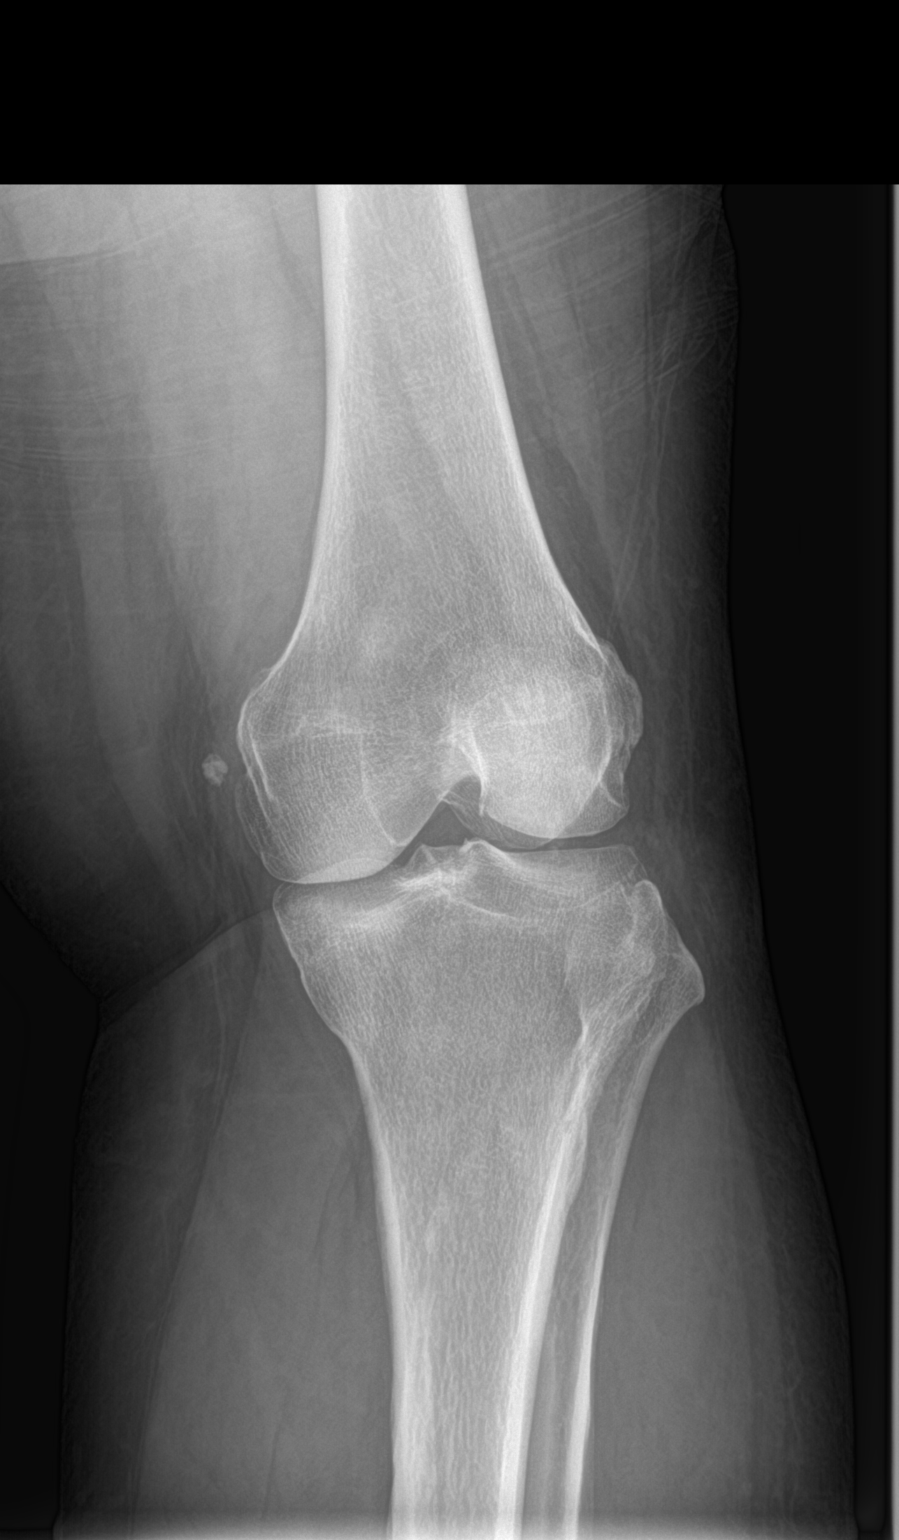

[knee lat]
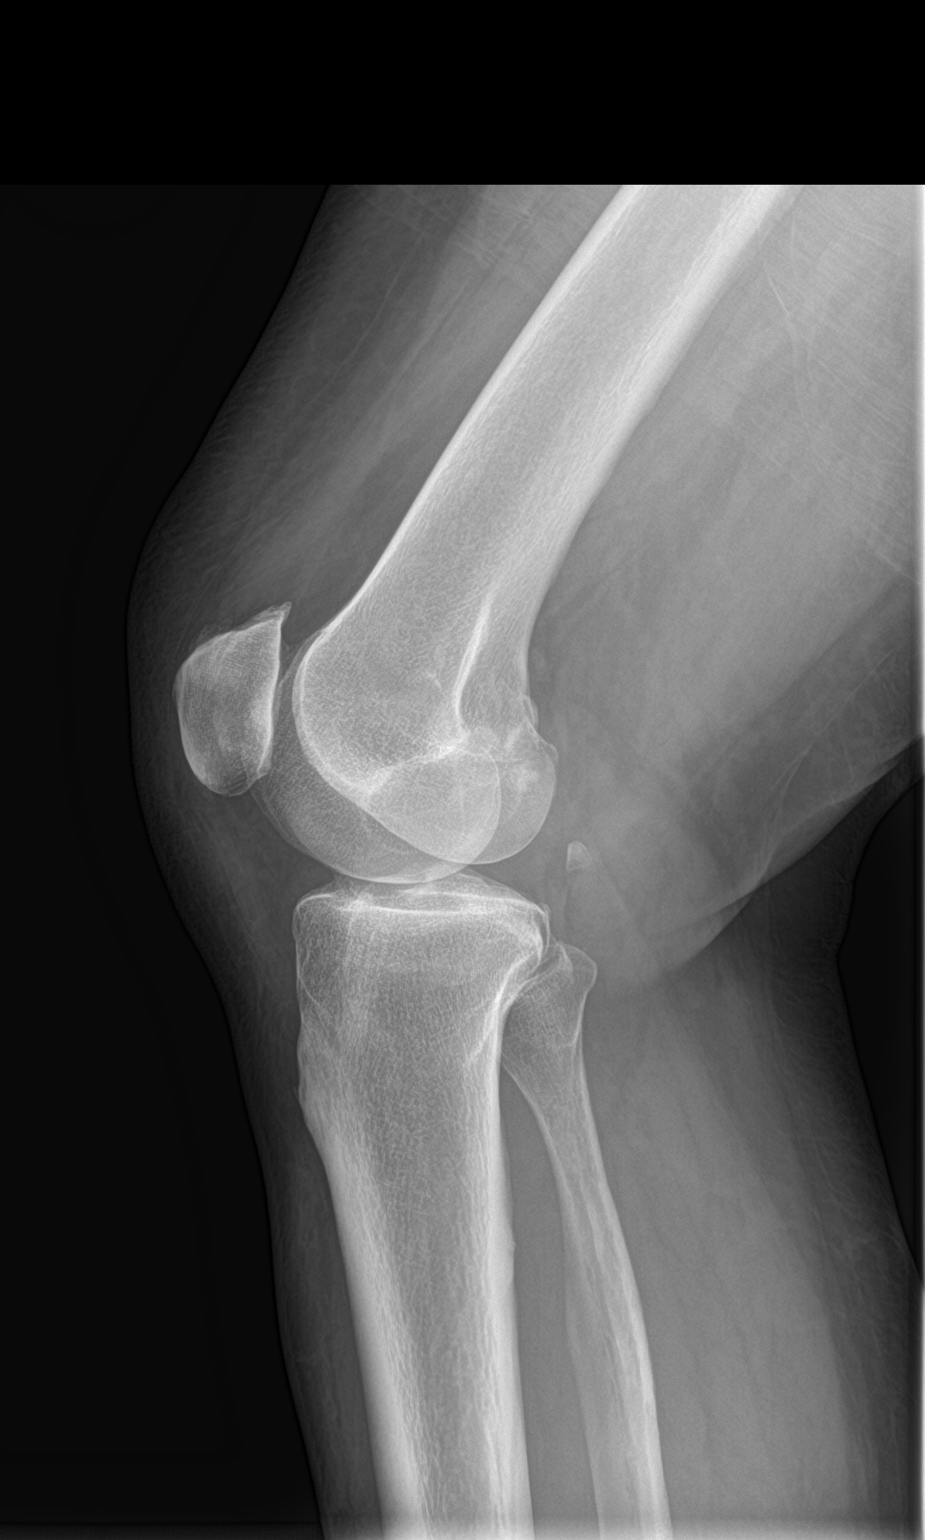

[sunrise]
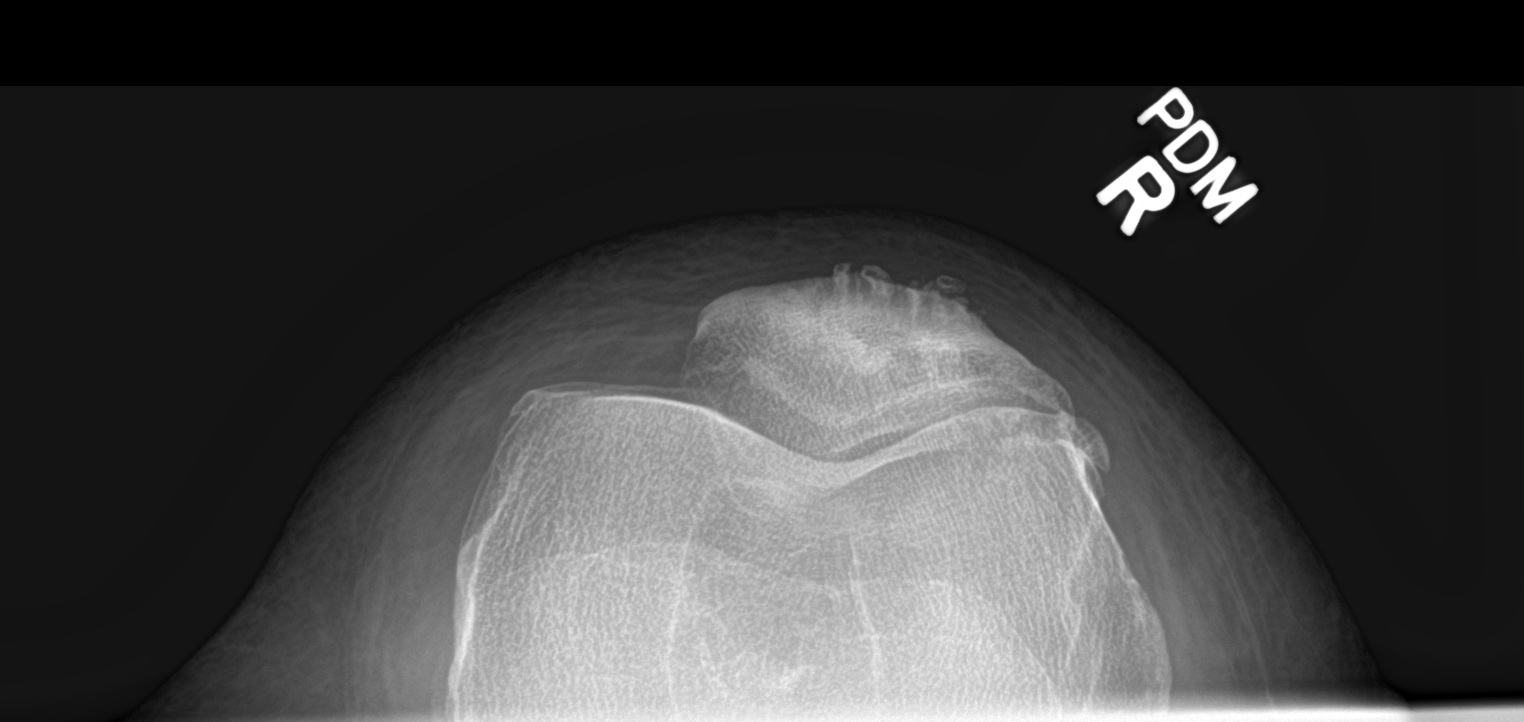

[4 of 4 positions shown; findings below may reference images not displayed]

FINDINGS: There is prepatellar right knee soft tissue swelling. No fracture,
joint effusion, dislocation or suspicious focal osseous lesion. Mild
medial compartment and moderate medial patellofemoral compartment
osteoarthritis. Preserved lateral compartment.
IMPRESSION: 1. No fracture, joint effusion or malalignment in the right knee.
2. Mild medial compartment and mild to moderate medial
patellofemoral compartment osteoarthritis in the right knee joint.

## 2015-08-24 IMAGING — DX DG CHEST 2V
2 series · 2 of 2 positions shown · non-contrast
Comparison: None.

CLINICAL DATA: Right chest wall pain after recent fall.

EXAM:
CHEST  2 VIEW

[chest pa]
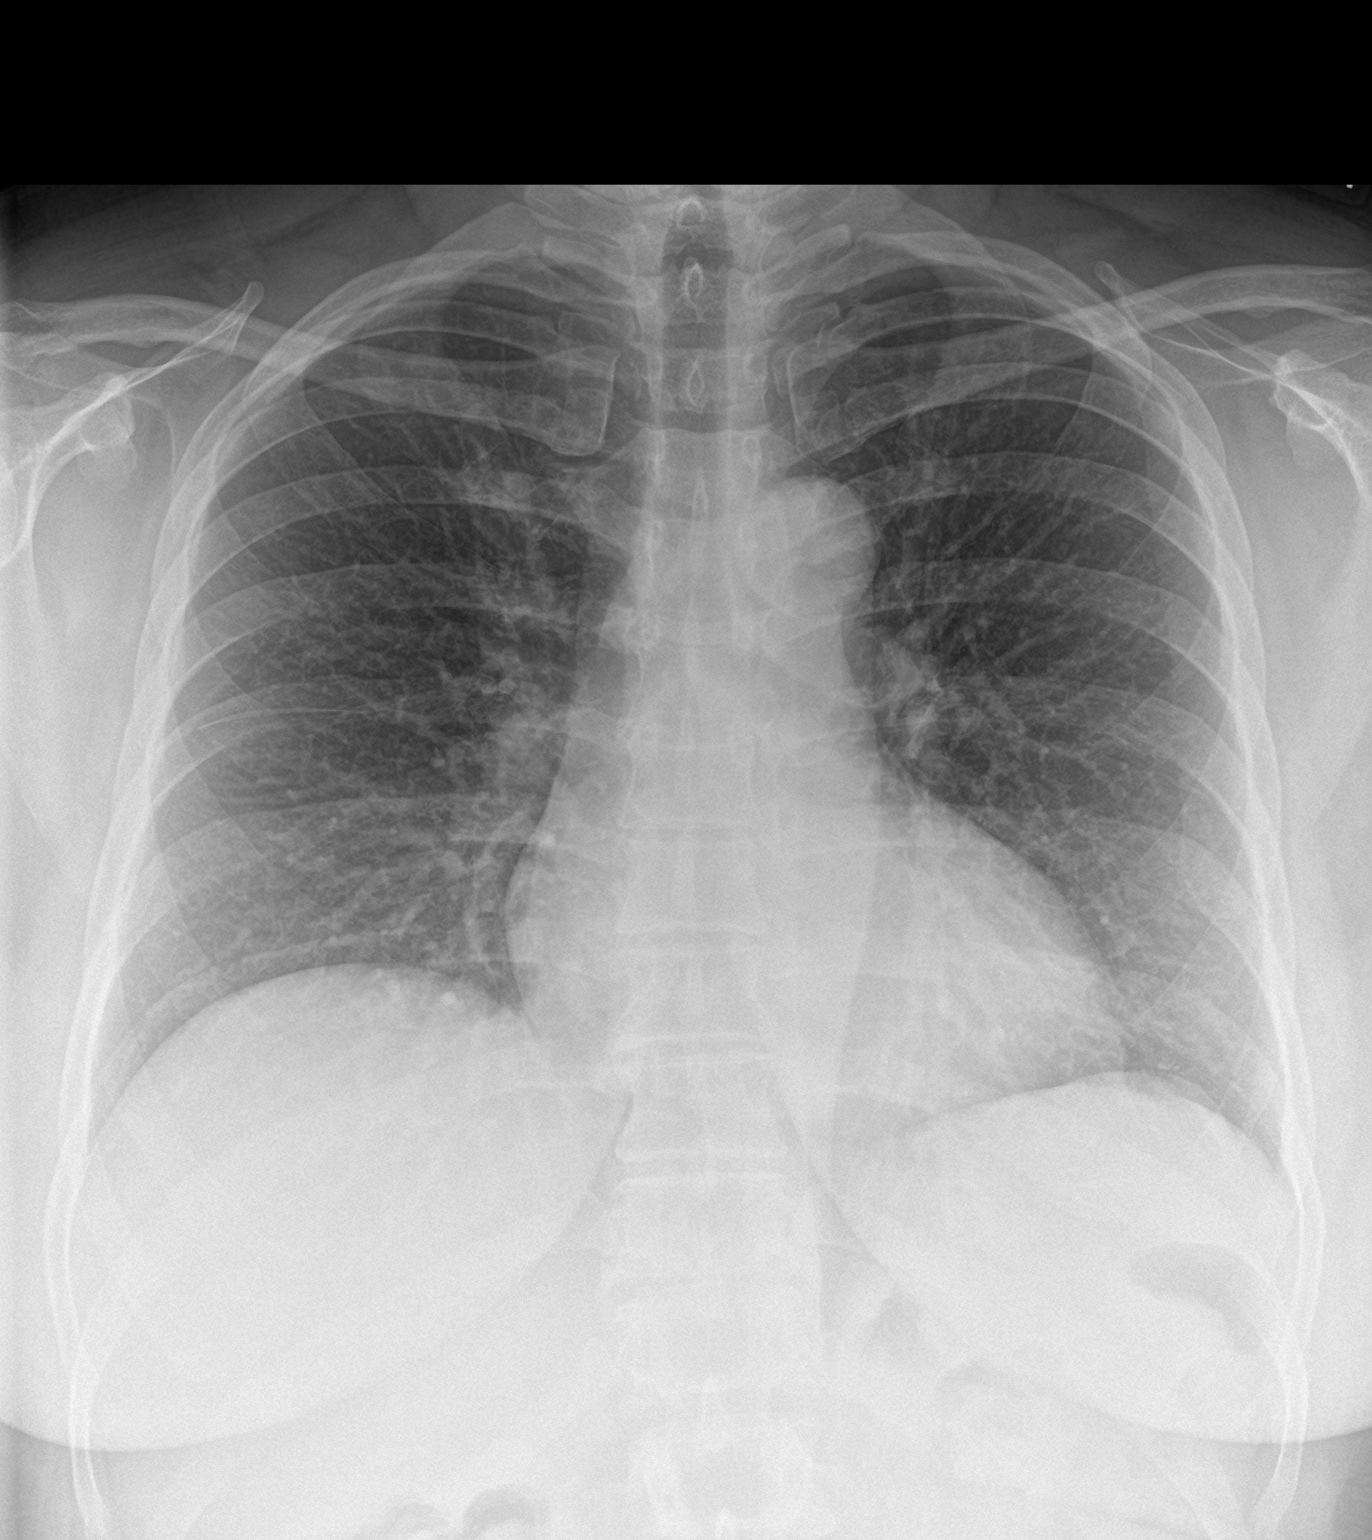

[chest lat]
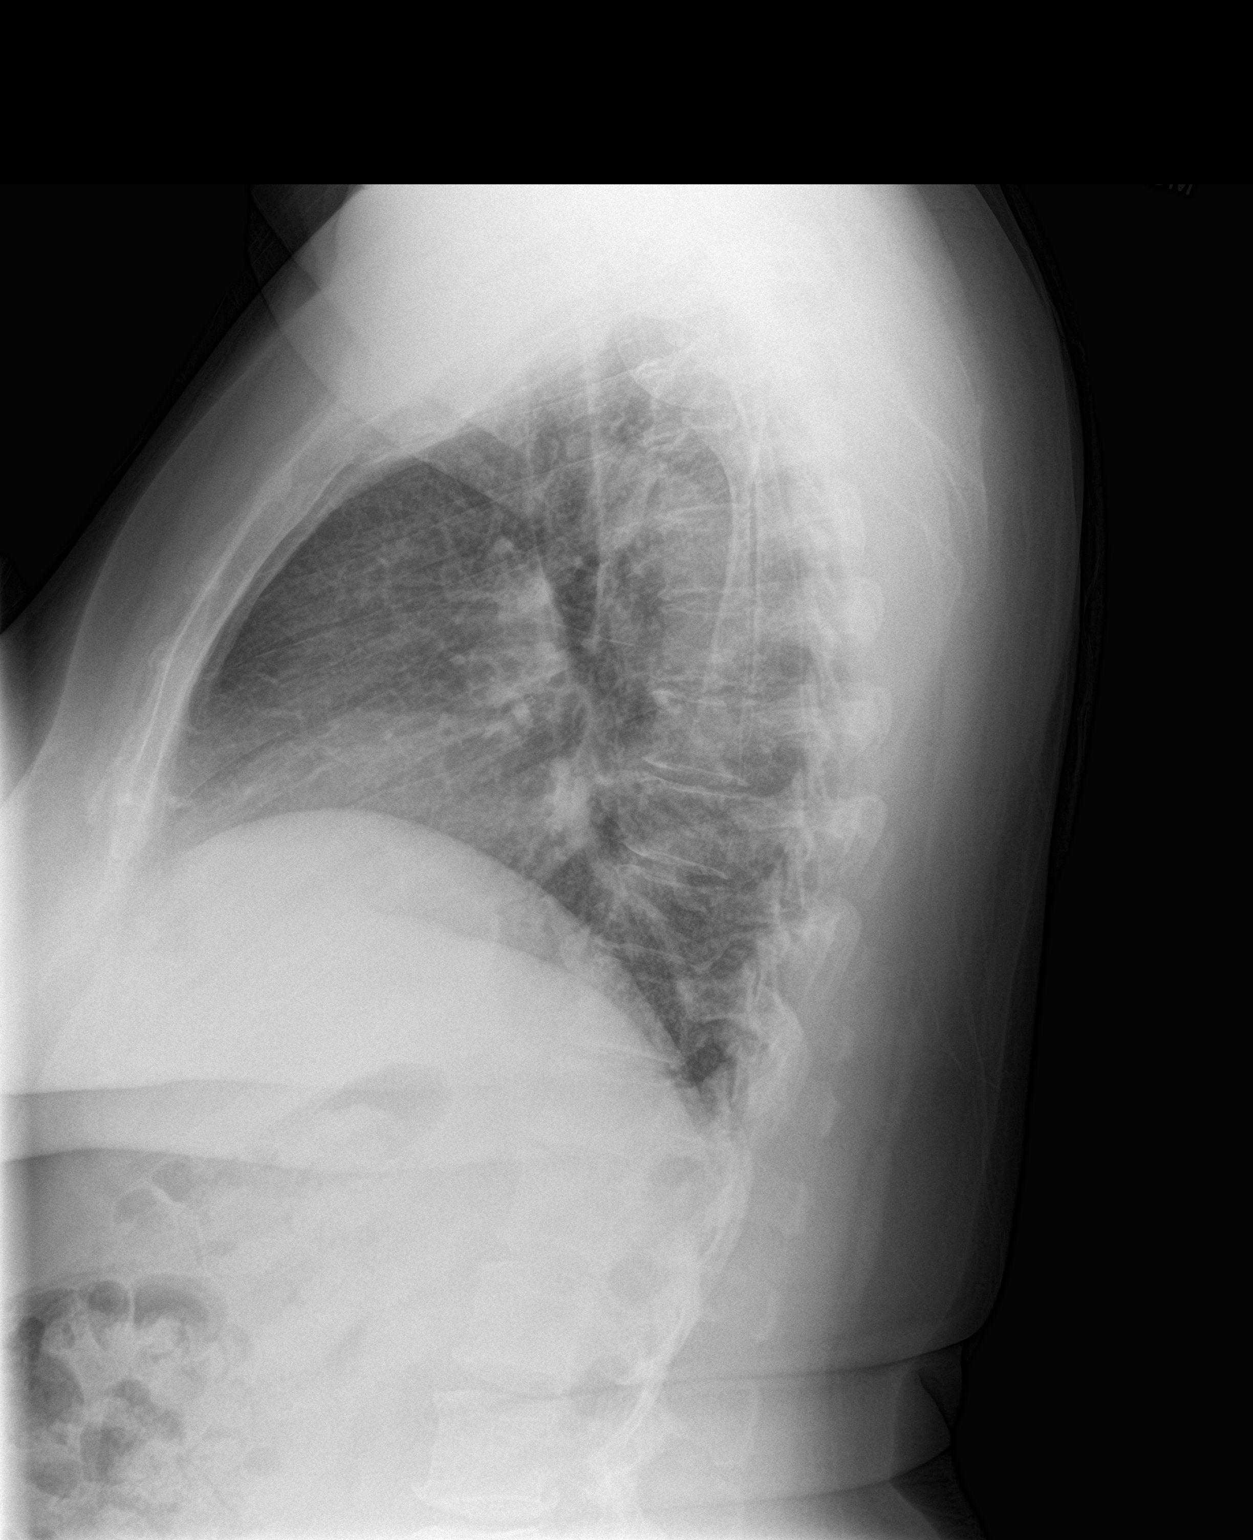

[2 of 2 positions shown; findings below may reference images not displayed]

FINDINGS: Top-normal heart size. Normal mediastinal contour. No pneumothorax.
No pleural effusion. Clear lungs, with no focal lung consolidation
and no pulmonary edema. Minimal degenerative changes in the thoracic
spine. No displaced fracture in the visualized chest.
IMPRESSION: No active cardiopulmonary disease.

## 2015-08-24 MED ORDER — TRAMADOL HCL 50 MG PO TABS: 50.0000 mg | ORAL_TABLET | Freq: Three times a day (TID) | ORAL | Status: DC | PRN

## 2015-08-24 NOTE — Patient Instructions (Signed)
  Your next office appointment will be determined based upon review of your pending  xrays  Those written interpretation of the lab results and instructions will be transmitted to you by mail for your records.  Critical results will be called.   Followup as needed for any active or acute issue. Please report any significant change in your symptoms.  Use an anti-inflammatory cream such as Aspercreme or Zostrix cream twice a day to the knee as needed. In lieu of this warm moist compresses or  hot water bottle can be used. Do not apply ice .

## 2015-08-24 NOTE — Progress Notes (Signed)
Pre visit review using our clinic review tool, if applicable. No additional management support is needed unless otherwise documented below in the visit note. 
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
## 2015-08-27 ENCOUNTER — Ambulatory Visit: Payer: BLUE CROSS/BLUE SHIELD | Admitting: Physician Assistant

## 2015-09-25 ENCOUNTER — Telehealth: Payer: Self-pay | Admitting: Physician Assistant

## 2015-09-25 NOTE — Telephone Encounter (Signed)
Call to schedule patient for Flu Shot. Patient declined because she has never taken one because of fear.

## 2015-11-23 ENCOUNTER — Encounter: Payer: Self-pay | Admitting: Physician Assistant

## 2015-11-23 ENCOUNTER — Ambulatory Visit (INDEPENDENT_AMBULATORY_CARE_PROVIDER_SITE_OTHER): Payer: BLUE CROSS/BLUE SHIELD | Admitting: Physician Assistant

## 2015-11-23 VITALS — BP 150/100 | HR 82 | Temp 98.2°F | Ht 62.5 in | Wt 203.2 lb

## 2015-11-23 DIAGNOSIS — E669 Obesity, unspecified: Secondary | ICD-10-CM

## 2015-11-23 DIAGNOSIS — E038 Other specified hypothyroidism: Secondary | ICD-10-CM | POA: Diagnosis not present

## 2015-11-23 DIAGNOSIS — M2550 Pain in unspecified joint: Secondary | ICD-10-CM | POA: Diagnosis not present

## 2015-11-23 LAB — CBC
HCT: 43.4 % (ref 36.0–46.0)
Hemoglobin: 14.2 g/dL (ref 12.0–15.0)
MCH: 28.1 pg (ref 26.0–34.0)
MCHC: 32.7 g/dL (ref 30.0–36.0)
MCV: 85.9 fL (ref 78.0–100.0)
MPV: 10 fL (ref 8.6–12.4)
Platelets: 238 10*3/uL (ref 150–400)
RBC: 5.05 MIL/uL (ref 3.87–5.11)
RDW: 14.2 % (ref 11.5–15.5)
WBC: 6.4 10*3/uL (ref 4.0–10.5)

## 2015-11-23 LAB — COMPREHENSIVE METABOLIC PANEL
ALT: 51 U/L — ABNORMAL HIGH (ref 6–29)
AST: 54 U/L — ABNORMAL HIGH (ref 10–35)
Albumin: 4.3 g/dL (ref 3.6–5.1)
Alkaline Phosphatase: 53 U/L (ref 33–130)
BUN: 11 mg/dL (ref 7–25)
CO2: 31 mmol/L (ref 20–31)
Calcium: 9.9 mg/dL (ref 8.6–10.4)
Chloride: 100 mmol/L (ref 98–110)
Creat: 0.78 mg/dL (ref 0.50–1.05)
Glucose, Bld: 101 mg/dL — ABNORMAL HIGH (ref 65–99)
Potassium: 4.3 mmol/L (ref 3.5–5.3)
Sodium: 138 mmol/L (ref 135–146)
Total Bilirubin: 0.8 mg/dL (ref 0.2–1.2)
Total Protein: 7.9 g/dL (ref 6.1–8.1)

## 2015-11-23 LAB — TSH: TSH: 4.569 u[IU]/mL — ABNORMAL HIGH (ref 0.350–4.500)

## 2015-11-23 MED ORDER — NAPROXEN SODIUM 550 MG PO TABS: 550.0000 mg | ORAL_TABLET | Freq: Two times a day (BID) | ORAL | Status: DC

## 2015-11-23 NOTE — Patient Instructions (Signed)
Please go to the lab for blood work. I will call with your results. Please continue chronic medications as directed. Start the Anaprox DS as directed with food.   I will call once I have found a weight medication covered by your insurance. We will schedule follow-up based on results.  DASH Eating Plan DASH stands for "Dietary Approaches to Stop Hypertension." The DASH eating plan is a healthy eating plan that has been shown to reduce high blood pressure (hypertension). Additional health benefits may include reducing the risk of type 2 diabetes mellitus, heart disease, and stroke. The DASH eating plan may also help with weight loss. WHAT DO I NEED TO KNOW ABOUT THE DASH EATING PLAN? For the DASH eating plan, you will follow these general guidelines:  Choose foods with a percent daily value for sodium of less than 5% (as listed on the food label).  Use salt-free seasonings or herbs instead of table salt or sea salt.  Check with your health care provider or pharmacist before using salt substitutes.  Eat lower-sodium products, often labeled as "lower sodium" or "no salt added."  Eat fresh foods.  Eat more vegetables, fruits, and low-fat dairy products.  Choose whole grains. Look for the word "whole" as the first word in the ingredient list.  Choose fish and skinless chicken or [place] more often than red meat. Limit fish, poultry, and meat to 6 oz (170 g) each day.  Limit sweets, desserts, sugars, and sugary drinks.  Choose heart-healthy fats.  Limit cheese to 1 oz (28 g) per day.  Eat more home-cooked food and less restaurant, buffet, and fast food.  Limit fried foods.  Cook foods using methods other than frying.  Limit canned vegetables. If you do use them, rinse them well to decrease the sodium.  When eating at a restaurant, ask that your food be prepared with less salt, or no salt if possible. WHAT FOODS CAN I EAT? Seek help from a dietitian for individual calorie  needs. Grains Whole grain or whole wheat bread. Brown rice. Whole grain or whole wheat pasta. Quinoa, bulgur, and whole grain cereals. Low-sodium cereals. Corn or whole wheat flour tortillas. Whole grain cornbread. Whole grain crackers. Low-sodium crackers. Vegetables Fresh or frozen vegetables (raw, steamed, roasted, or grilled). Low-sodium or reduced-sodium tomato and vegetable juices. Low-sodium or reduced-sodium tomato sauce and paste. Low-sodium or reduced-sodium canned vegetables.  Fruits All fresh, canned (in natural juice), or frozen fruits. Meat and Other Protein Products Ground beef (85% or leaner), grass-fed beef, or beef trimmed of fat. Skinless chicken or [place]. Ground chicken or [place]. Pork trimmed of fat. All fish and seafood. Eggs. Dried beans, peas, or lentils. Unsalted nuts and seeds. Unsalted canned beans. Dairy Low-fat dairy products, such as skim or 1% milk, 2% or reduced-fat cheeses, low-fat ricotta or cottage cheese, or plain low-fat yogurt. Low-sodium or reduced-sodium cheeses. Fats and Oils Tub margarines without trans fats. Light or reduced-fat mayonnaise and salad dressings (reduced sodium). Avocado. Safflower, olive, or canola oils. Natural peanut or almond butter. Other Unsalted popcorn and pretzels. The items listed above may not be a complete list of recommended foods or beverages. Contact your dietitian for more options. WHAT FOODS ARE NOT RECOMMENDED? Grains White bread. White pasta. White rice. Refined cornbread. Bagels and croissants. Crackers that contain trans fat. Vegetables Creamed or fried vegetables. Vegetables in a cheese sauce. Regular canned vegetables. Regular canned tomato sauce and paste. Regular tomato and vegetable juices. Fruits Dried fruits. Canned fruit in light or  heavy syrup. Fruit juice. Meat and Other Protein Products Fatty cuts of meat. Ribs, chicken wings, bacon, sausage, bologna, salami, chitterlings, fatback, hot dogs, bratwurst,  and packaged luncheon meats. Salted nuts and seeds. Canned beans with salt. Dairy Whole or 2% milk, cream, half-and-half, and cream cheese. Whole-fat or sweetened yogurt. Full-fat cheeses or blue cheese. Nondairy creamers and whipped toppings. Processed cheese, cheese spreads, or cheese curds. Condiments Onion and garlic salt, seasoned salt, table salt, and sea salt. Canned and packaged gravies. Worcestershire sauce. Tartar sauce. Barbecue sauce. Teriyaki sauce. Soy sauce, including reduced sodium. Steak sauce. Fish sauce. Oyster sauce. Cocktail sauce. Horseradish. Ketchup and mustard. Meat flavorings and tenderizers. Bouillon cubes. Hot sauce. Tabasco sauce. Marinades. Taco seasonings. Relishes. Fats and Oils Butter, stick margarine, lard, shortening, ghee, and bacon fat. Coconut, palm kernel, or palm oils. Regular salad dressings. Other Pickles and olives. Salted popcorn and pretzels. The items listed above may not be a complete list of foods and beverages to avoid. Contact your dietitian for more information. WHERE CAN I FIND MORE INFORMATION? National Heart, Lung, and Blood Institute: travelstabloid.com   This information is not intended to replace advice given to you by your health care provider. Make sure you discuss any questions you have with your health care provider.   Document Released: 09/25/2011 Document Revised: 10/27/2014 Document Reviewed: 08/10/2013 Elsevier Interactive Patient Education [employer].

## 2015-11-23 NOTE — Progress Notes (Signed)
Patient presents to clinic today c/o continued pain in knees and hands bilaterally. Denies swelling or redness overlying the joints. Endorses family history of rheumatoid arthritis. Previous workup for rheumatoid arthritis negative. Patient states she is   standing for long periods of time each day.   Patient also endorses consistent weight gain despite staying active throughout the day and watching her appetite. Has a history of hypothyroidism. Endorses taking her levothyroxine as directed. Body mass index is 36.55 kg/(m^2).   Past Medical History  Diagnosis Date  . Thyroid disease   . Hypertension   . Diabetes mellitus without complication [facility])     Current Outpatient Prescriptions on File Prior to Visit  Medication Sig Dispense Refill  . Calcium Carbonate-Vit D-Min (CALCIUM 1200 PO) Take by mouth.    . fluticasone (FLONASE) 50 MCG/ACT nasal spray Place 2 sprays into both nostrils daily. 16 g 6  . gabapentin (NEURONTIN) 300 MG capsule Take 300 mg by mouth 3 (three) times daily.    Marland Kitchen glucose blood test strip CONTOUR BLOOD GLUCOSE TEST STRIP.  USE ONE STRIP TO CHECK GLUCOSE TWICE DAILY. 100 each 12  . lisinopril-hydrochlorothiazide (PRINZIDE,ZESTORETIC) 10-12.5 MG tablet Take 1 tablet by mouth daily. 90 tablet 1  . metFORMIN (GLUCOPHAGE) 500 MG tablet Take 1 tablet (500 mg total) by mouth daily with breakfast. 90 tablet 1  . Omega-3 Fatty Acids (FISH OIL) 1000 MG CAPS Take by mouth.     No current facility-administered medications on file prior to visit.    No Known Allergies  Family History  Problem Relation Age of Onset  . Kidney disease Mother     Kidney Failure  . Heart disease Father     MI    Social History   Social History  . Marital Status: Married    Spouse Name: N/A  . Number of Children: N/A  . Years of Education: N/A   Social History Main Topics  . Smoking status: Never Smoker   . Smokeless tobacco: None  . Alcohol Use: None  . Drug Use: No  . Sexual  Activity: Not Asked   Other Topics Concern  . None   Social History Narrative   Review of Systems - See HPI.  All other ROS are negative.  BP 150/100 mmHg  Pulse 82  Temp(Src) 98.2 F (36.8 C) (Oral)  Ht 5' 2.5" (1.588 m)  Wt 203 lb 3.2 oz (92.171 kg)  BMI 36.55 kg/m2  SpO2 96%  Physical Exam  Constitutional: She is oriented to person, place, and time and well-developed, well-nourished, and in no distress.  HENT:  Head: Normocephalic and atraumatic.  Neck: Neck supple. No thyromegaly present.  Cardiovascular: Normal rate, regular rhythm, normal heart sounds and intact distal pulses.   Pulmonary/Chest: Effort normal and breath sounds normal. No respiratory distress. She has no wheezes. She has no rales. She exhibits no tenderness.  Musculoskeletal:       Right knee: No tenderness found.       Left knee: No tenderness found.  Pain in bilateral hands and wrists without abnormal exam findings.  Neurological: She is alert and oriented to person, place, and time.  Skin: Skin is warm and dry. No rash noted.  Psychiatric: Affect normal.  Vitals reviewed.   Recent Results (from the past 2160 hour(s))  CBC     Status: None   Collection Time: 11/23/15  4:59 PM  Result Value Ref Range   WBC 6.4 4.0 - 10.5 K/uL   RBC  5.05 3.87 - 5.11 MIL/uL   Hemoglobin 14.2 12.0 - 15.0 g/dL   HCT 43.4 36.0 - 46.0 %   MCV 85.9 78.0 - 100.0 fL   MCH 28.1 26.0 - 34.0 pg   MCHC 32.7 30.0 - 36.0 g/dL   RDW 14.2 11.5 - 15.5 %   Platelets 238 150 - 400 K/uL   MPV 10.0 8.6 - 12.4 fL  Comp Met (CMET)     Status: Abnormal   Collection Time: 11/23/15  4:59 PM  Result Value Ref Range   Sodium 138 135 - 146 mmol/L   Potassium 4.3 3.5 - 5.3 mmol/L   Chloride 100 98 - 110 mmol/L   CO2 31 20 - 31 mmol/L   Glucose, Bld 101 (H) 65 - 99 mg/dL   BUN 11 7 - 25 mg/dL   Creat 0.78 0.50 - 1.05 mg/dL   Total Bilirubin 0.8 0.2 - 1.2 mg/dL   Alkaline Phosphatase 53 33 - 130 U/L   AST 54 (H) 10 - 35 U/L   ALT  51 (H) 6 - 29 U/L   Total Protein 7.9 6.1 - 8.1 g/dL   Albumin 4.3 3.6 - 5.1 g/dL   Calcium 9.9 8.6 - 10.4 mg/dL  Sed Rate (ESR)     Status: None   Collection Time: 11/23/15  4:59 PM  Result Value Ref Range   Sed Rate 18 0 - 30 mm/hr  TSH     Status: Abnormal   Collection Time: 11/23/15  4:59 PM  Result Value Ref Range   TSH 4.569 (H) 0.350 - 9.597 uIU/mL  Cyclic citrul peptide antibody, IgG     Status: None   Collection Time: 11/23/15  4:59 PM  Result Value Ref Range   Cyclic Citrullin Peptide Ab <16 Units    Comment:   Reference Range Negative               < 20 Weak Positive            20 - 39 Moderate Positive        40 - 59 Strong Positive        > 59     Assessment/Plan: Joint pain Will obtain esr and anti CCP antibodies today. Will begin Celebrex as directed. Follow-up 3-4 weeks.  Obesity Body mass index is 36.55 kg/(m^2). Diet and exercise regimen reviewed. Will obtain TSH level today. Will consider addition of Belviq.  Other specified hypothyroidism Will repeat TSH levels today. Discussed appropriate administration of levothyroxine with respect to use of other medications and with meals.

## 2015-11-23 NOTE — Progress Notes (Signed)
Pre visit review using our clinic review tool, if applicable. No additional management support is needed unless otherwise documented below in the visit note. 

## 2015-11-24 LAB — SEDIMENTATION RATE: Sed Rate: 18 mm/hr (ref 0–30)

## 2015-11-26 ENCOUNTER — Other Ambulatory Visit: Payer: Self-pay | Admitting: Physician Assistant

## 2015-11-26 ENCOUNTER — Telehealth: Payer: Self-pay | Admitting: Physician Assistant

## 2015-11-26 DIAGNOSIS — M2550 Pain in unspecified joint: Secondary | ICD-10-CM | POA: Insufficient documentation

## 2015-11-26 LAB — CYCLIC CITRUL PEPTIDE ANTIBODY, IGG: Cyclic Citrullin Peptide Ab: <16 Units

## 2015-11-26 MED ORDER — LORCASERIN HCL 10 MG PO TABS: 1.0000 | ORAL_TABLET | Freq: Two times a day (BID) | ORAL | Status: DC

## 2015-11-26 MED ORDER — LEVOTHYROXINE SODIUM 125 MCG PO TABS: 125.0000 ug | ORAL_TABLET | Freq: Every day | ORAL | Status: DC

## 2015-11-26 NOTE — Assessment & Plan Note (Signed)
Will obtain esr and anti CCP antibodies today. Will begin Celebrex as directed. Follow-up 3-4 weeks.

## 2015-11-26 NOTE — Assessment & Plan Note (Signed)
Will repeat TSH levels today. Discussed appropriate administration of levothyroxine with respect to use of other medications and with meals.

## 2015-11-26 NOTE — Telephone Encounter (Signed)
Medication is at the [facility] in [place]. Per our conversation earlier. That is where medication was faxed to. We will refax just to confirm receipt.

## 2015-11-26 NOTE — Telephone Encounter (Signed)
Relation to ZO:XWRU Call back number:[phone] Pharmacy: [facility] - [place], [place] - [address] [phone] (Phone) [phone] (Fax)         Reason for call:  Patient states pharmacy never received Lorcaserin HCl (BELVIQ) 10 MG TABS patient checked with pharmacy a few minutes ago.

## 2015-11-26 NOTE — Assessment & Plan Note (Signed)
Body mass index is 36.55 kg/(m^2). Diet and exercise regimen reviewed. Will obtain TSH level today. Will consider addition of Belviq.

## 2016-02-04 ENCOUNTER — Ambulatory Visit (INDEPENDENT_AMBULATORY_CARE_PROVIDER_SITE_OTHER): Payer: BLUE CROSS/BLUE SHIELD | Admitting: Physician Assistant

## 2016-02-04 ENCOUNTER — Encounter: Payer: Self-pay | Admitting: Physician Assistant

## 2016-02-04 ENCOUNTER — Ambulatory Visit (HOSPITAL_BASED_OUTPATIENT_CLINIC_OR_DEPARTMENT_OTHER)
Admission: RE | Admit: 2016-02-04 | Discharge: 2016-02-04 | Disposition: A | Discharge status: Home / Self Care | Payer: BLUE CROSS/BLUE SHIELD | Source: Ambulatory Visit | Attending: Physician Assistant | Admitting: Physician Assistant

## 2016-02-04 VITALS — BP 122/88 | HR 66 | Temp 98.0°F | Ht 62.0 in | Wt 201.2 lb

## 2016-02-04 DIAGNOSIS — G8929 Other chronic pain: Secondary | ICD-10-CM

## 2016-02-04 DIAGNOSIS — M47896 Other spondylosis, lumbar region: Secondary | ICD-10-CM | POA: Diagnosis not present

## 2016-02-04 DIAGNOSIS — M542 Cervicalgia: Secondary | ICD-10-CM | POA: Insufficient documentation

## 2016-02-04 DIAGNOSIS — M545 Low back pain, unspecified: Secondary | ICD-10-CM

## 2016-02-04 DIAGNOSIS — M47812 Spondylosis without myelopathy or radiculopathy, cervical region: Secondary | ICD-10-CM | POA: Diagnosis not present

## 2016-02-04 DIAGNOSIS — G6289 Other specified polyneuropathies: Secondary | ICD-10-CM | POA: Diagnosis not present

## 2016-02-04 DIAGNOSIS — M47892 Other spondylosis, cervical region: Secondary | ICD-10-CM | POA: Diagnosis not present

## 2016-02-04 DIAGNOSIS — M4802 Spinal stenosis, cervical region: Secondary | ICD-10-CM | POA: Diagnosis not present

## 2016-02-04 DIAGNOSIS — M47816 Spondylosis without myelopathy or radiculopathy, lumbar region: Secondary | ICD-10-CM | POA: Diagnosis not present

## 2016-02-04 DIAGNOSIS — M4186 Other forms of scoliosis, lumbar region: Secondary | ICD-10-CM | POA: Insufficient documentation

## 2016-02-04 DIAGNOSIS — E1142 Type 2 diabetes mellitus with diabetic polyneuropathy: Secondary | ICD-10-CM | POA: Diagnosis not present

## 2016-02-04 DIAGNOSIS — E039 Hypothyroidism, unspecified: Secondary | ICD-10-CM | POA: Diagnosis not present

## 2016-02-04 LAB — CBC
HCT: 42.6 % (ref 36.0–46.0)
Hemoglobin: 14.3 g/dL (ref 12.0–15.0)
MCHC: 33.5 g/dL (ref 30.0–36.0)
MCV: 84.9 fl (ref 78.0–100.0)
Platelets: 232 10*3/uL (ref 150.0–400.0)
RBC: 5.02 Mil/uL (ref 3.87–5.11)
RDW: 13.9 % (ref 11.5–15.5)
WBC: 7.7 10*3/uL (ref 4.0–10.5)

## 2016-02-04 LAB — FERRITIN: Ferritin: 27.1 ng/mL (ref 10.0–291.0)

## 2016-02-04 LAB — VITAMIN B12: Vitamin B-12: 410 pg/mL (ref 211–911)

## 2016-02-04 LAB — TSH: TSH: 0.64 u[IU]/mL (ref 0.35–4.50)

## 2016-02-04 LAB — HEMOGLOBIN A1C: Hgb A1c MFr Bld: 6.7 % — ABNORMAL HIGH (ref 4.6–6.5)

## 2016-02-04 IMAGING — CR DG CERVICAL SPINE COMPLETE 4+V
5 series · 5 of 5 positions shown · non-contrast
Comparison: None.

CLINICAL DATA: C/o fell in [RR] and has had chronic bilat lower
back and also bilat neck pain since then. Hx herniated disc in lower
back. Hx c-section, myomectomy

EXAM:
CERVICAL SPINE - COMPLETE 4+ VIEW

[w c-spine lat]
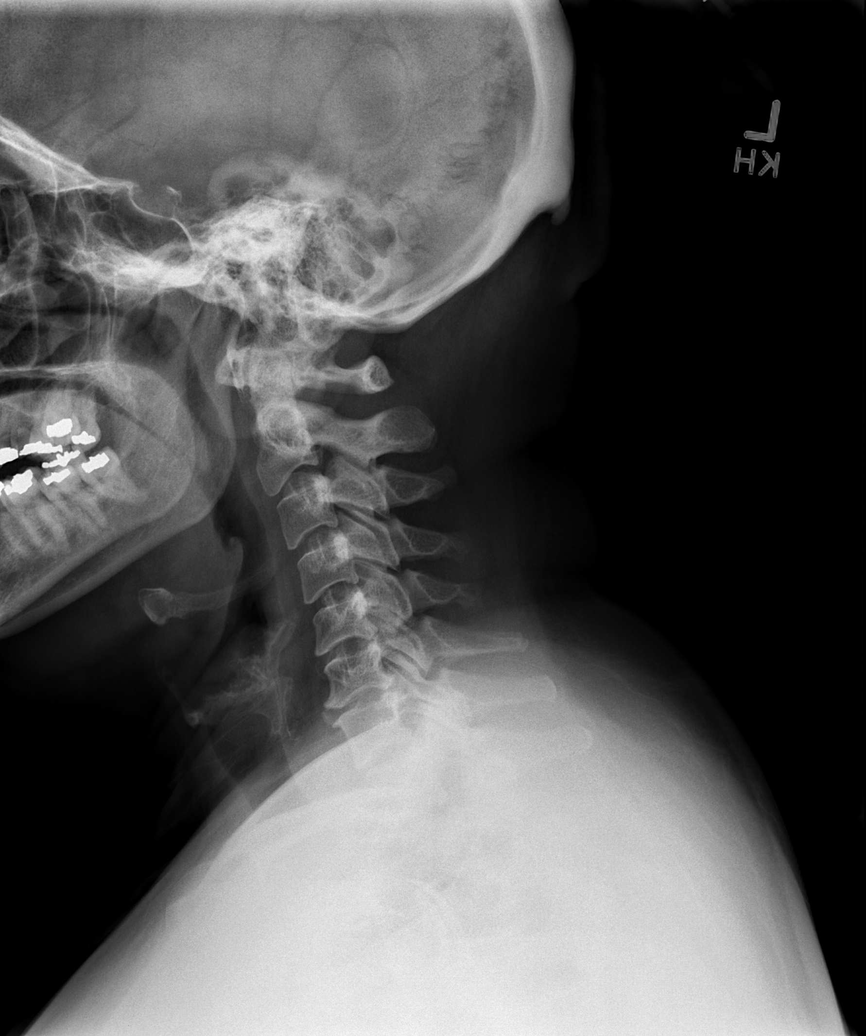

[w c-spine oblique (1 of 2)]
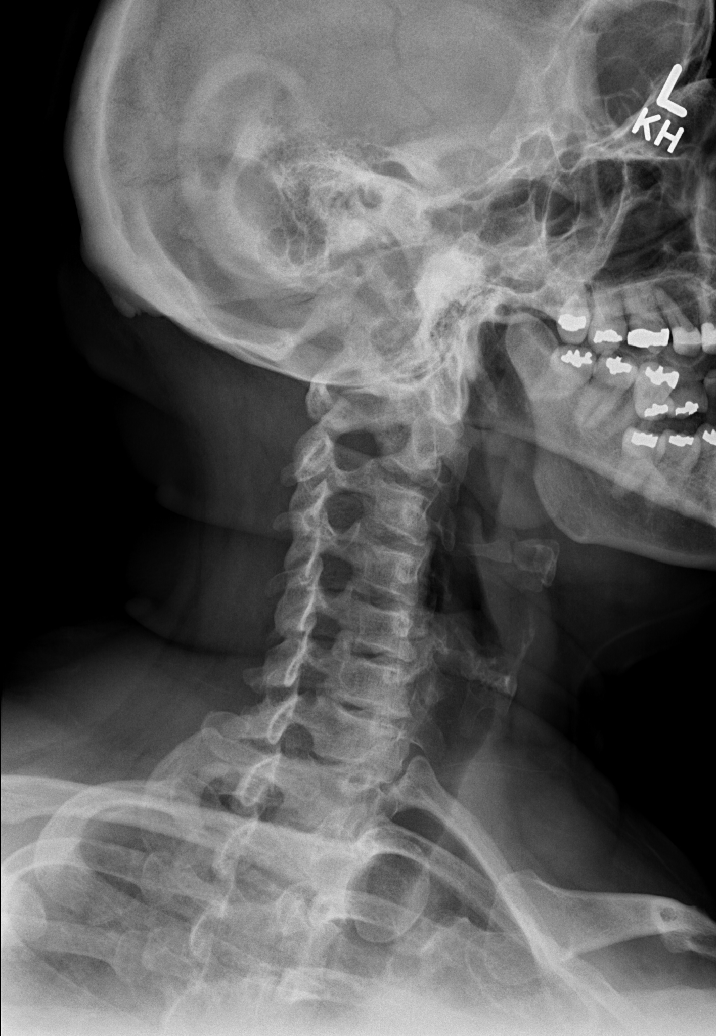

[w c-spine oblique (2 of 2)]
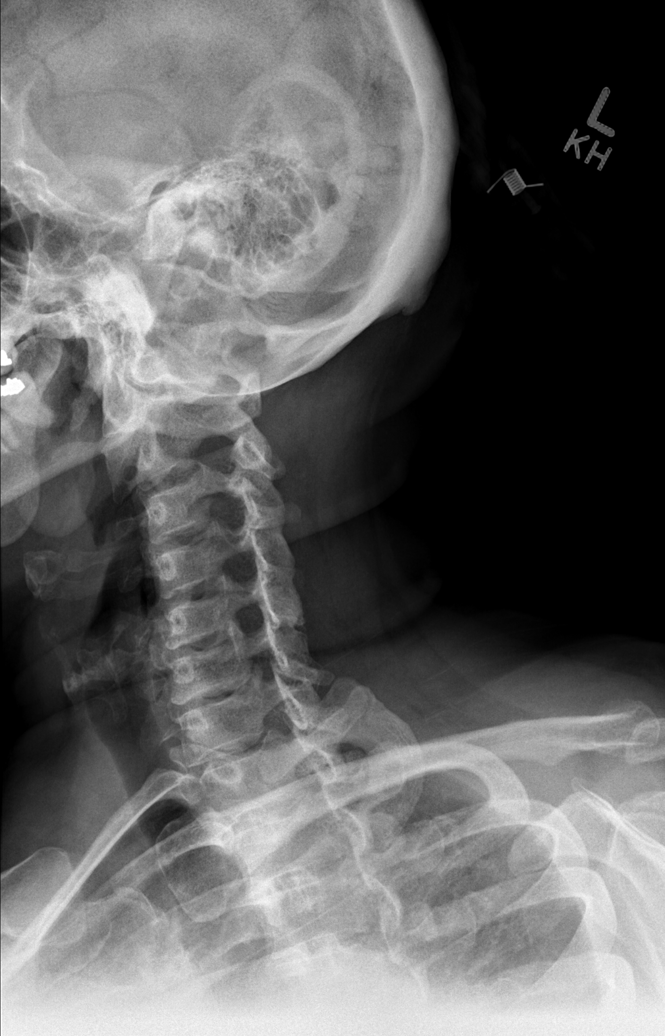

[w c-spine a.p.]
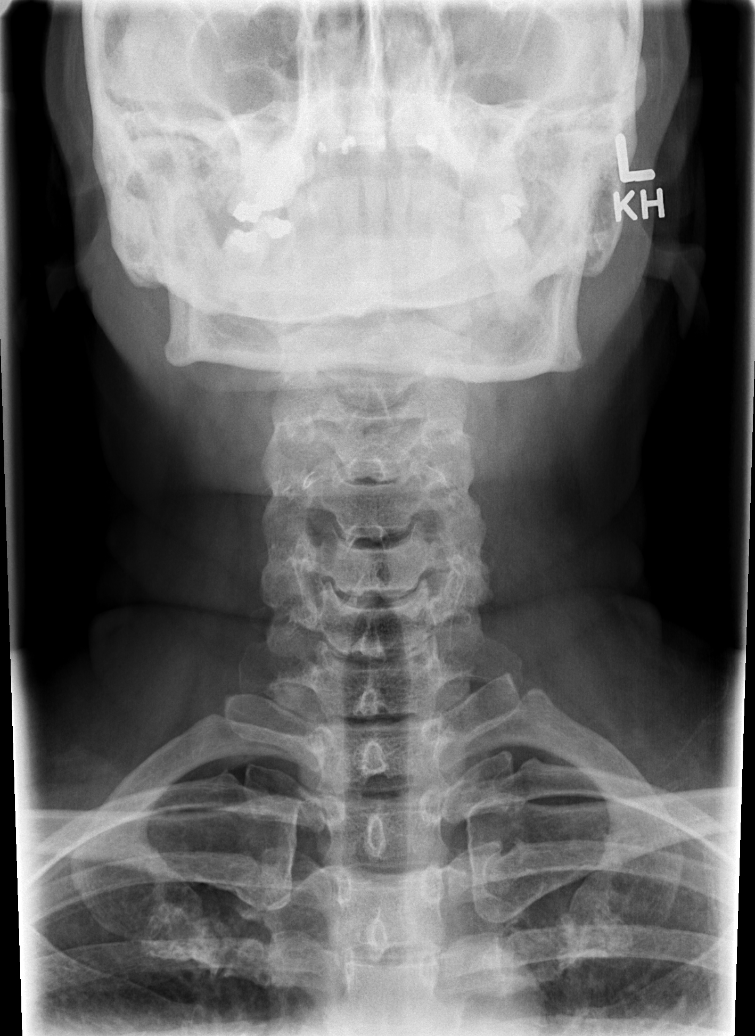

[w c-spine odontoid]
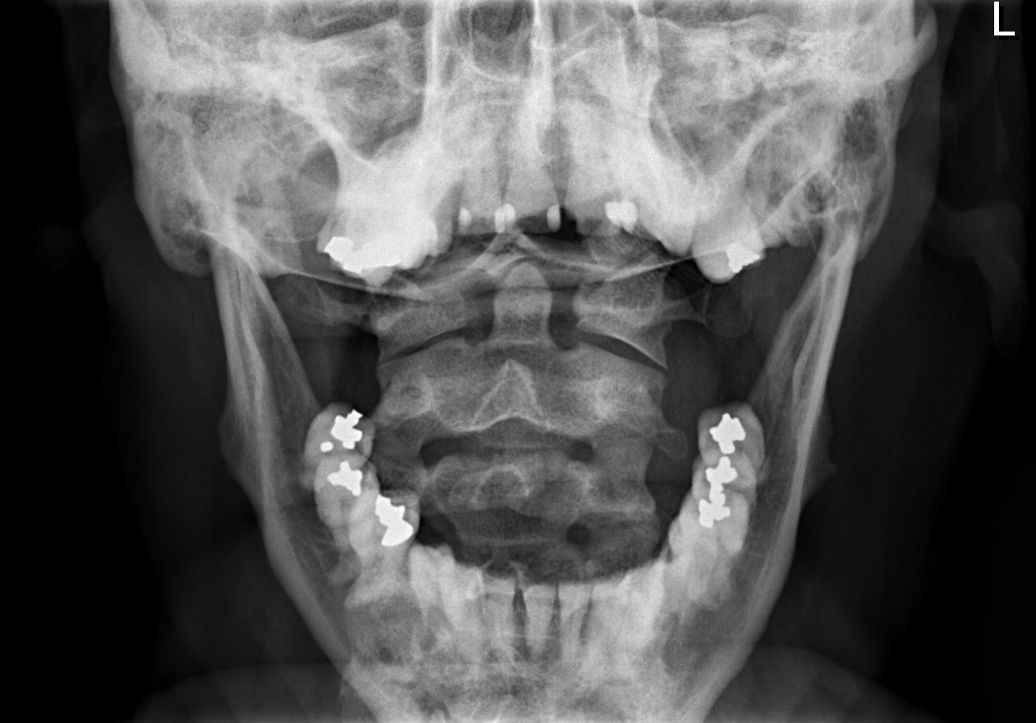

[5 of 5 positions shown; findings below may reference images not displayed]

FINDINGS: No evidence of a recent or remote fracture.  No spondylolisthesis.

Mild loss of disc height and small endplate osteophytes at C5-C6 and
C6-C7, most evident at C6-C7. Remaining cervical disc spaces are
well preserved.

Moderate neural foraminal narrowing from uncovertebral spurring
noted on the left at C6-C7. Remaining neural foramina are well
preserved.

Normal soft tissues.
IMPRESSION: 1. No evidence of a recent or remote fracture.
2. No spondylolisthesis.
3. Disc degenerative changes in the lower cervical spine most
evident at C6-C7 with moderate left neural foraminal narrowing.

## 2016-02-04 IMAGING — CR DG LUMBAR SPINE COMPLETE 4+V
5 series · 5 of 5 positions shown · non-contrast
Comparison: [DATE]

CLINICAL DATA: Chronic low back pain. Fall in [G3] with pain since.

EXAM:
LUMBAR SPINE - COMPLETE 4+ VIEW

[t l-spine a.p.]
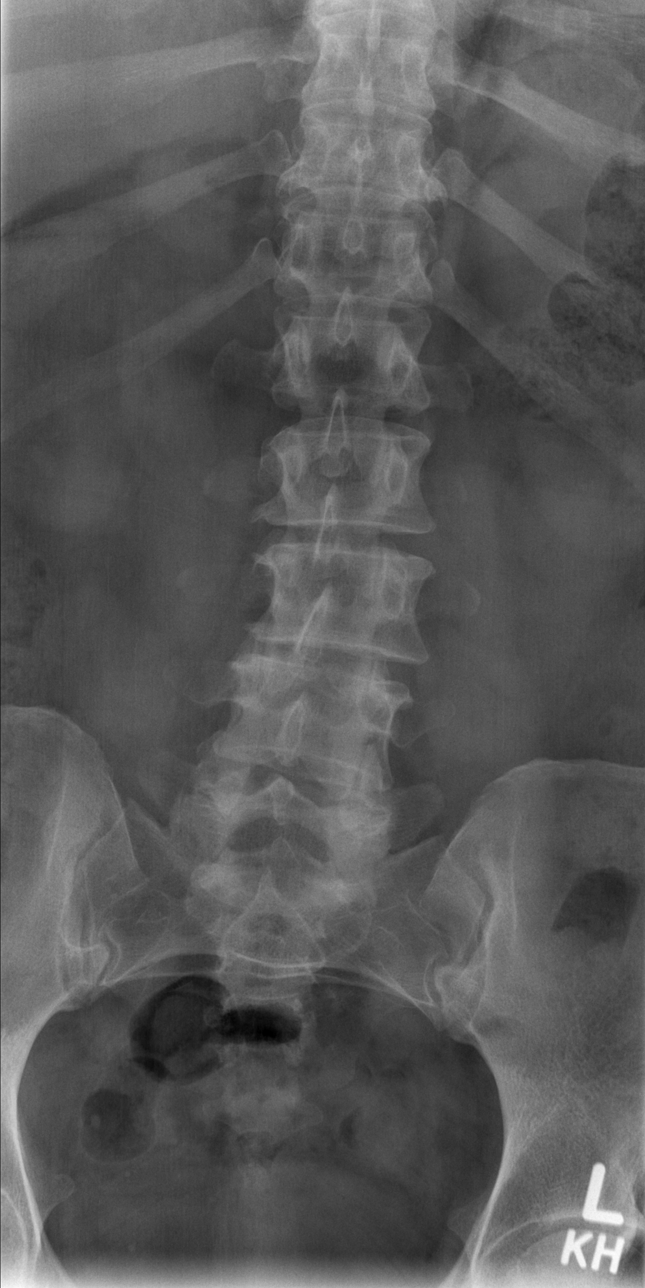

[t l-spine oblique exposure (1 of 2)]
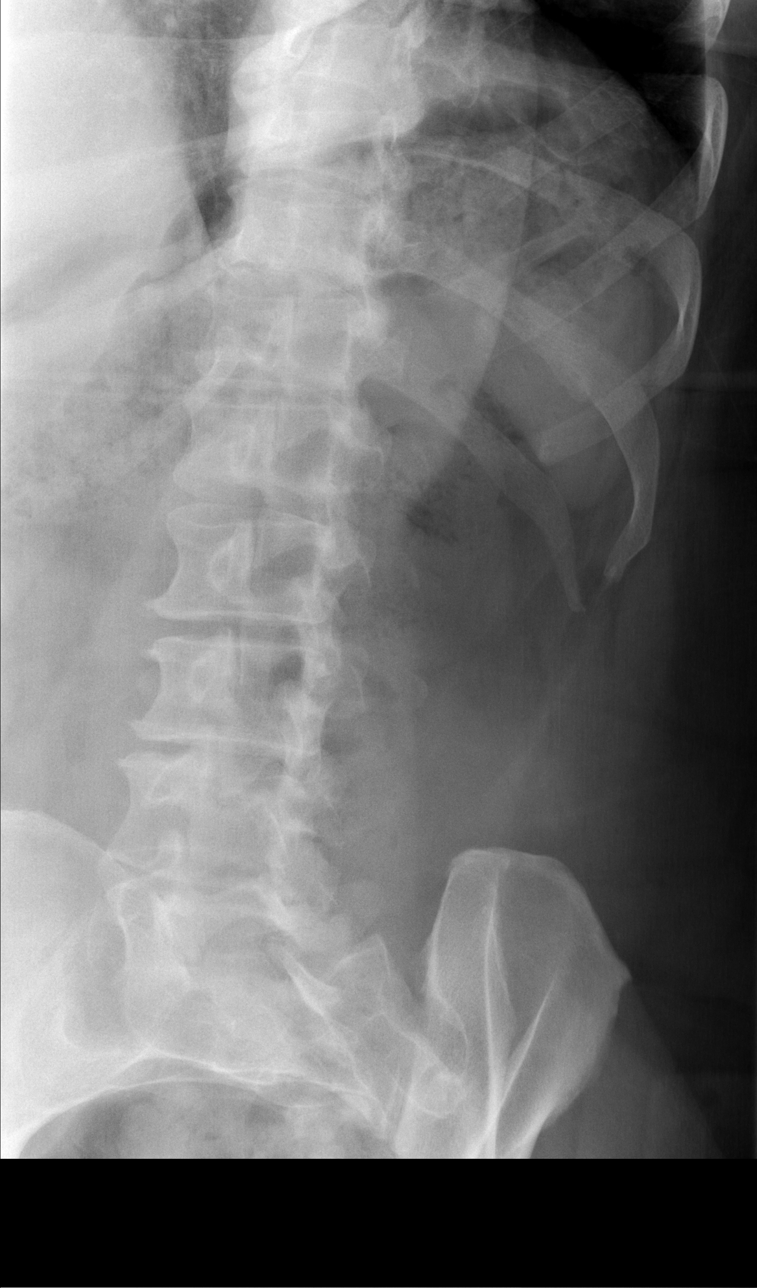

[t l-spine oblique exposure (2 of 2)]
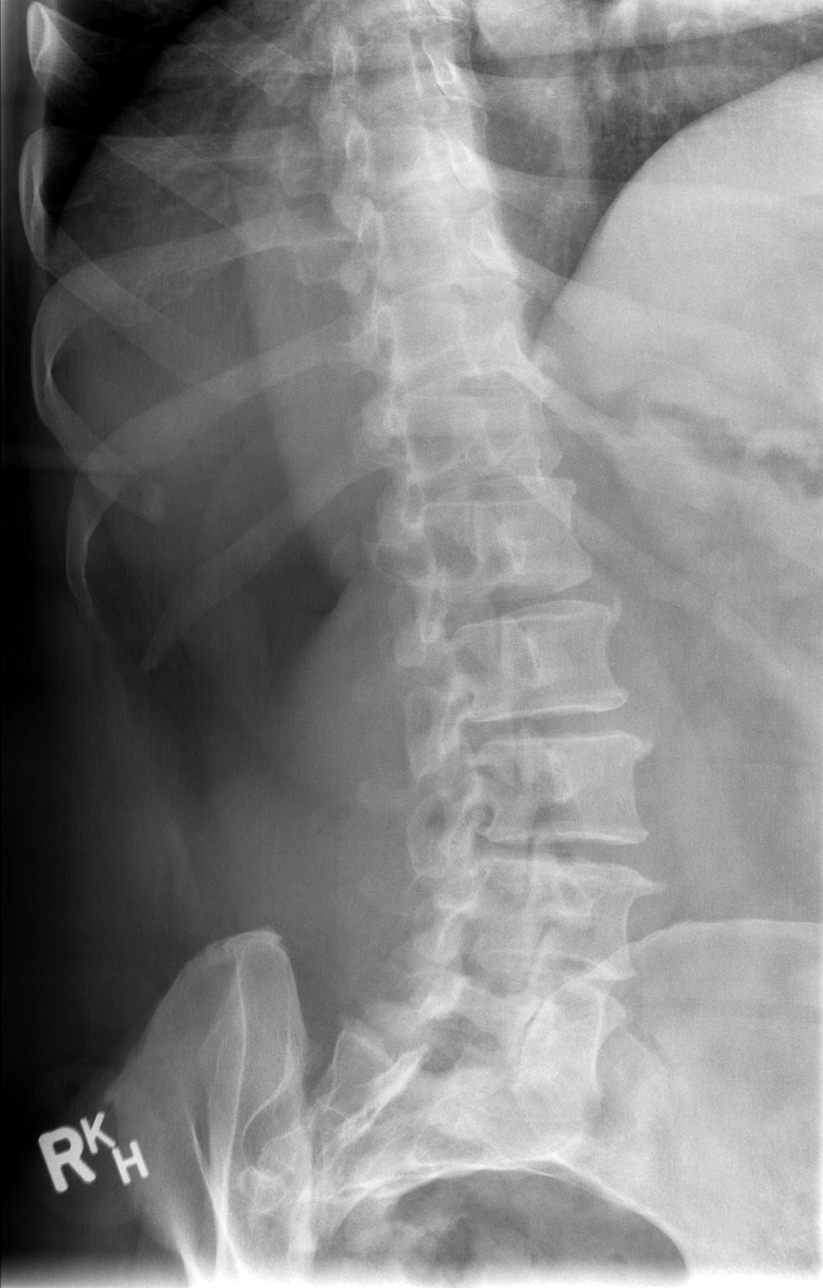

[t l-spine lat]
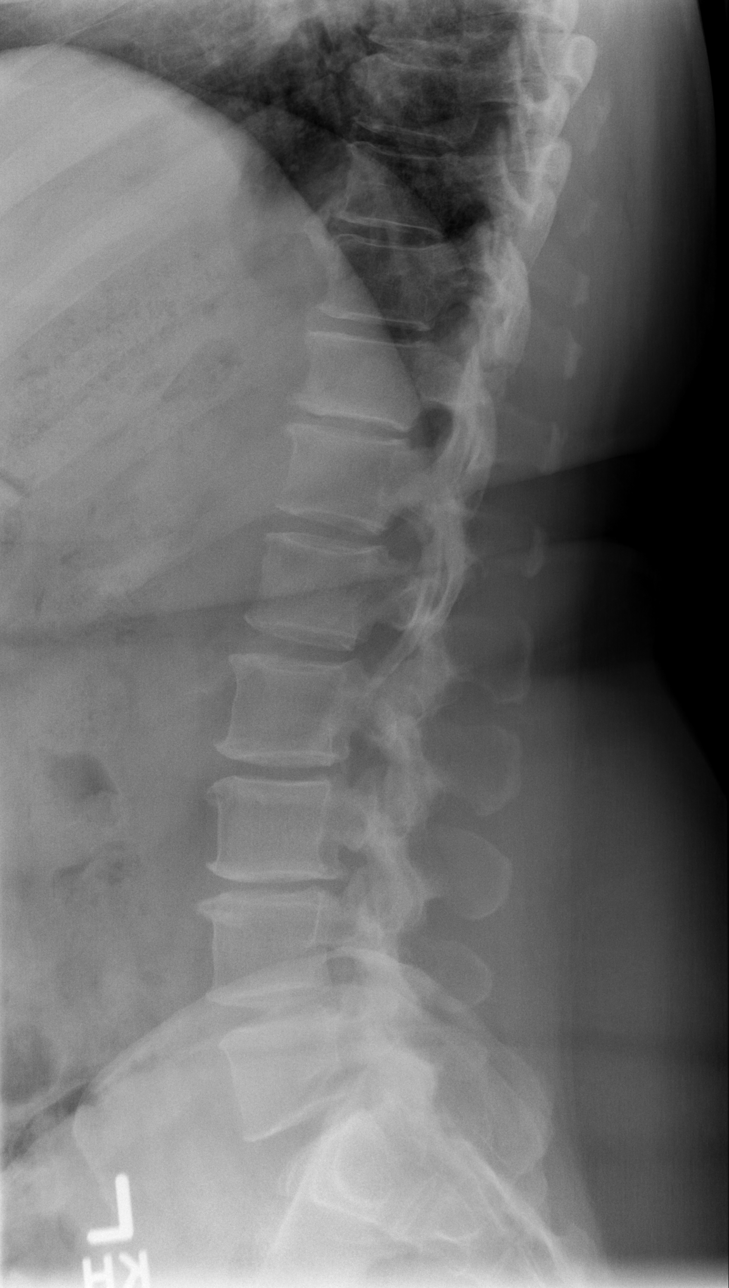

[t l-spine l5-s1 spot]
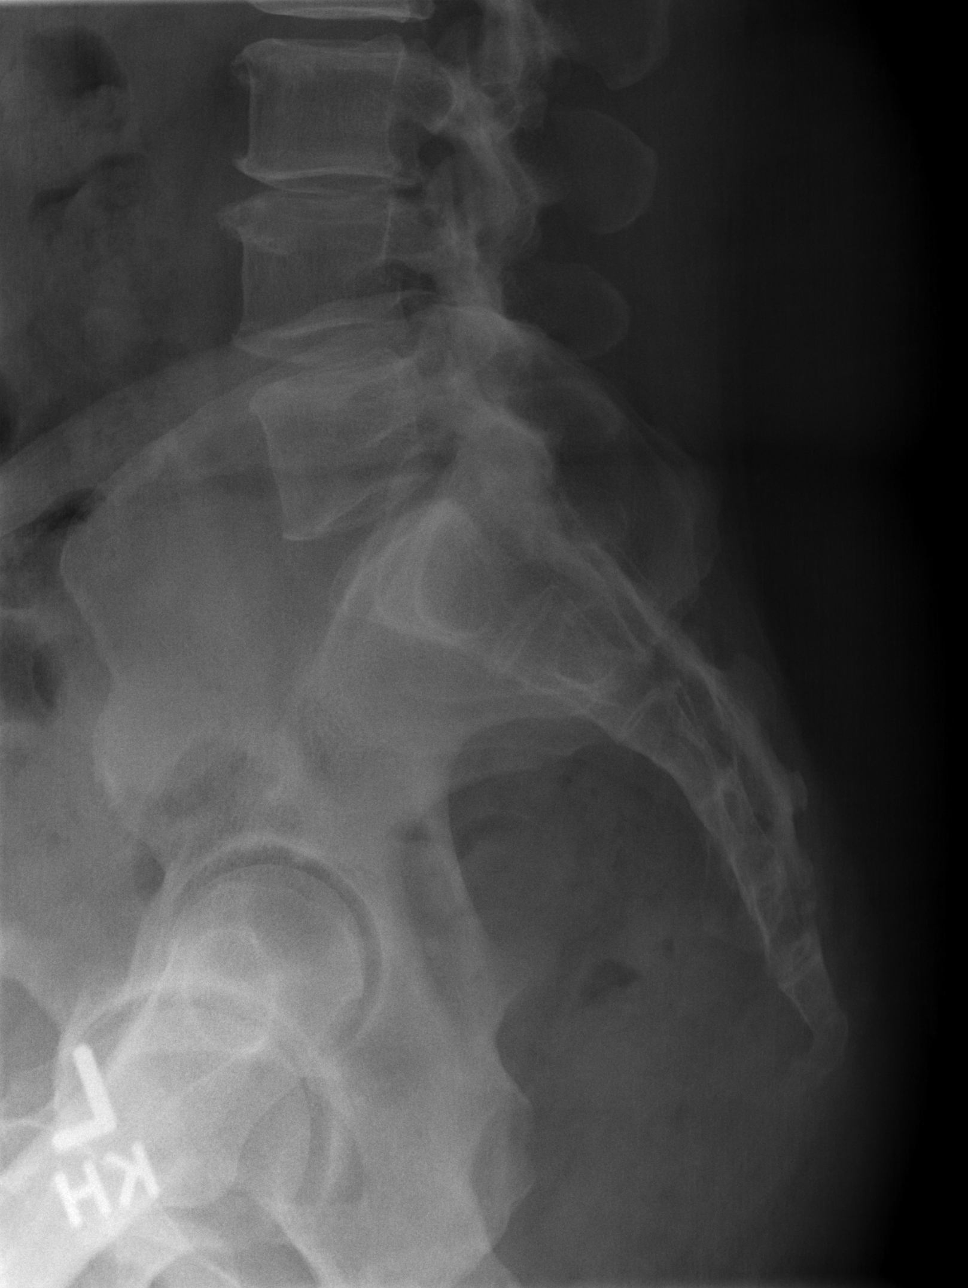

[5 of 5 positions shown; findings below may reference images not displayed]

FINDINGS: Mild levoscoliosis in the mid lumbar spine. Mild degenerative disc
disease at L2-3 and L3-4 with disc space narrowing and spurring. No
fracture. No subluxation. SI joints are symmetric and unremarkable.
IMPRESSION: Mild levoscoliosis and mild degenerative changes. No acute findings.

## 2016-02-04 MED ORDER — HYDROCODONE-ACETAMINOPHEN 10-325 MG PO TABS: 1.0000 | ORAL_TABLET | Freq: Three times a day (TID) | ORAL | Status: DC | PRN

## 2016-02-04 MED ORDER — PREGABALIN 50 MG PO CAPS: 50.0000 mg | ORAL_CAPSULE | Freq: Two times a day (BID) | ORAL | Status: DC

## 2016-02-04 NOTE — Patient Instructions (Signed)
Please go to the lab for blood work. Then go downstairs for imaging. I will call with results and we will alter your regimen accordingly.  Please start the Lyrica as directed. Use the Hydrocodone as directed for severe pain.   Follow-up with me in 2 weeks. Return sooner if needed.

## 2016-02-04 NOTE — Progress Notes (Signed)
Patient presents to clinic today c/o continued weight gain despite taking thyroid medication as directed. Has been hydrating and working on exercise without results. Body mass index is 36.8 kg/(m^2). Was previously on Belviq without any change in weight. Would like to discuss other options.   Patient also endorses deterioration in her chronic back pain. Patient with history of lumbar disc disease (MRI 2015). Has not had imaging since that time. Denies radiation of pain. Endorses some mild bilateral weakness of lower extremities. Denies saddle anesthesia or change to bowel/bladder habits. Is taking Gabapentin as directed.  Patient also endorsing continued chronic fatigue. Previous workups have been unremarkable except for her hypothyroidism.   Past Medical History  Diagnosis Date  . Thyroid disease   . Hypertension   . Diabetes mellitus without complication [facility])     Current Outpatient Prescriptions on File Prior to Visit  Medication Sig Dispense Refill  . Calcium Carbonate-Vit D-Min (CALCIUM 1200 PO) Take by mouth.    . ferrous sulfate 325 (65 FE) MG tablet Take 1 tablet by mouth daily.    . fluticasone (FLONASE) 50 MCG/ACT nasal spray Place 2 sprays into both nostrils daily. 16 g 6  . gabapentin (NEURONTIN) 300 MG capsule Take 300 mg by mouth 3 (three) times daily.    Marland Kitchen glucose blood test strip CONTOUR BLOOD GLUCOSE TEST STRIP.  USE ONE STRIP TO CHECK GLUCOSE TWICE DAILY. 100 each 12  . levothyroxine (SYNTHROID, LEVOTHROID) 125 MCG tablet Take 1 tablet (125 mcg total) by mouth daily before breakfast. 30 tablet 3  . lisinopril-hydrochlorothiazide (PRINZIDE,ZESTORETIC) 10-12.5 MG tablet Take 1 tablet by mouth daily. 90 tablet 1  . Lorcaserin HCl (BELVIQ) 10 MG TABS Take 1 tablet by mouth 2 (two) times daily. 60 tablet 1  . metFORMIN (GLUCOPHAGE) 500 MG tablet Take 1 tablet (500 mg total) by mouth daily with breakfast. 90 tablet 1  . naproxen sodium (ANAPROX) 550 MG tablet Take 1 tablet  (550 mg total) by mouth 2 (two) times daily with a meal. 60 tablet 1  . Omega-3 Fatty Acids (FISH OIL) 1000 MG CAPS Take by mouth.     No current facility-administered medications on file prior to visit.    No Known Allergies  Family History  Problem Relation Age of Onset  . Kidney disease Mother     Kidney Failure  . Heart disease Father     MI    Social History   Social History  . Marital Status: Married    Spouse Name: N/A  . Number of Children: N/A  . Years of Education: N/A   Social History Main Topics  . Smoking status: Never Smoker   . Smokeless tobacco: None  . Alcohol Use: None  . Drug Use: No  . Sexual Activity: Not Asked   Other Topics Concern  . None   Social History Narrative    Review of Systems - See HPI.  All other ROS are negative.  BP 122/88 mmHg  Pulse 66  Temp(Src) 98 F (36.7 C) (Oral)  Ht '5\' 2"'  (1.575 m)  Wt 201 lb 4 oz (91.286 kg)  BMI 36.80 kg/m2  SpO2 98%  Physical Exam  Constitutional: She is oriented to person, place, and time and well-developed, well-nourished, and in no distress.  HENT:  Head: Normocephalic and atraumatic.  Eyes: Conjunctivae are normal.  Neck: Neck supple. No thyromegaly present.  Cardiovascular: Normal rate, regular rhythm, normal heart sounds and intact distal pulses.   Pulmonary/Chest: Effort normal and breath  sounds normal. No respiratory distress. She has no wheezes. She has no rales. She exhibits no tenderness.  Neurological: She is alert and oriented to person, place, and time.  Skin: Skin is warm and dry. No rash noted.  Psychiatric: Affect normal.  Vitals reviewed.   Recent Results (from the past 2160 hour(s))  CBC     Status: None   Collection Time: 11/23/15  4:59 PM  Result Value Ref Range   WBC 6.4 4.0 - 10.5 K/uL   RBC 5.05 3.87 - 5.11 MIL/uL   Hemoglobin 14.2 12.0 - 15.0 g/dL   HCT 43.4 36.0 - 46.0 %   MCV 85.9 78.0 - 100.0 fL   MCH 28.1 26.0 - 34.0 pg   MCHC 32.7 30.0 - 36.0 g/dL    RDW 14.2 11.5 - 15.5 %   Platelets 238 150 - 400 K/uL   MPV 10.0 8.6 - 12.4 fL  Comp Met (CMET)     Status: Abnormal   Collection Time: 11/23/15  4:59 PM  Result Value Ref Range   Sodium 138 135 - 146 mmol/L   Potassium 4.3 3.5 - 5.3 mmol/L   Chloride 100 98 - 110 mmol/L   CO2 31 20 - 31 mmol/L   Glucose, Bld 101 (H) 65 - 99 mg/dL   BUN 11 7 - 25 mg/dL   Creat 0.78 0.50 - 1.05 mg/dL   Total Bilirubin 0.8 0.2 - 1.2 mg/dL   Alkaline Phosphatase 53 33 - 130 U/L   AST 54 (H) 10 - 35 U/L   ALT 51 (H) 6 - 29 U/L   Total Protein 7.9 6.1 - 8.1 g/dL   Albumin 4.3 3.6 - 5.1 g/dL   Calcium 9.9 8.6 - 10.4 mg/dL  Sed Rate (ESR)     Status: None   Collection Time: 11/23/15  4:59 PM  Result Value Ref Range   Sed Rate 18 0 - 30 mm/hr  TSH     Status: Abnormal   Collection Time: 11/23/15  4:59 PM  Result Value Ref Range   TSH 4.569 (H) 0.350 - 7.619 uIU/mL  Cyclic citrul peptide antibody, IgG     Status: None   Collection Time: 11/23/15  4:59 PM  Result Value Ref Range   Cyclic Citrullin Peptide Ab <16 Units    Comment:   Reference Range Negative               < 20 Weak Positive            20 - 39 Moderate Positive        40 - 59 Strong Positive        > 59     Assessment/Plan: 1. Cervical pain (neck) Will change Gabapentin for Lyrica. Rx Norco given to help with pain. Use sparingly. Will obtain repeat imaging today. Will refer to Ortho/Neurosurgery pending results. - pregabalin (LYRICA) 50 MG capsule; Take 1 capsule (50 mg total) by mouth 2 (two) times daily.  Dispense: 60 capsule; Refill: 0 - DG Cervical Spine Complete; Future  2. Chronic low back pain Deteriorated. Repeat imaging today. Will change Gabapentin to Lyrica. Norco. Supportive measures reviewed. - pregabalin (LYRICA) 50 MG capsule; Take 1 capsule (50 mg total) by mouth 2 (two) times daily.  Dispense: 60 capsule; Refill: 0 - DG Lumbar Spine Complete; Future  3. Other polyneuropathy ([place]) Diabetes is a contributing  factor. Will also check iron level and B12. Lyrica should help with symptoms. - CBC - Ferritin - B12 -  pregabalin (LYRICA) 50 MG capsule; Take 1 capsule (50 mg total) by mouth 2 (two) times daily.  Dispense: 60 capsule; Refill: 0  4. Hypothyroidism, unspecified hypothyroidism type Will repeat labs today.  - TSH  5. Controlled type 2 diabetes mellitus with diabetic polyneuropathy, without long-term current use of insulin (HCC) Will repeat A1C today due to current symptoms. - Hemoglobin A1c

## 2016-02-04 NOTE — Progress Notes (Signed)
Pre visit review using our clinic review tool, if applicable. No additional management support is needed unless otherwise documented below in the visit note. 

## 2016-02-05 ENCOUNTER — Other Ambulatory Visit: Payer: Self-pay | Admitting: Physician Assistant

## 2016-02-05 DIAGNOSIS — E119 Type 2 diabetes mellitus without complications: Secondary | ICD-10-CM

## 2016-02-05 MED ORDER — METFORMIN HCL 500 MG PO TABS: 500.0000 mg | ORAL_TABLET | Freq: Two times a day (BID) | ORAL | Status: DC

## 2016-02-08 ENCOUNTER — Ambulatory Visit: Payer: BLUE CROSS/BLUE SHIELD | Admitting: Physician Assistant

## 2016-02-08 ENCOUNTER — Other Ambulatory Visit: Payer: Self-pay | Admitting: Physician Assistant

## 2016-02-08 ENCOUNTER — Telehealth: Payer: Self-pay | Admitting: Physician Assistant

## 2016-02-08 DIAGNOSIS — M5090 Cervical disc disorder, unspecified, unspecified cervical region: Secondary | ICD-10-CM

## 2016-02-08 MED ORDER — NALTREXONE-BUPROPION HCL ER 8-90 MG PO TB12: 1.0000 | ORAL_TABLET | Freq: Two times a day (BID) | ORAL | Status: DC

## 2016-02-08 NOTE — Telephone Encounter (Signed)
I have placed prescription for Contrave.  Please initiate PA for this -- she has tried OTC Alli, Rx Belviq. Not a good candidate for phentermine.

## 2016-02-08 NOTE — Telephone Encounter (Signed)
Done. Please get this initiated. Once she is approved we need to discuss medications -- She cannot take the Contrave the the Hydrocodone given at last visit. However she should be finished with the script by the time we start Contrave.

## 2016-02-08 NOTE — Telephone Encounter (Signed)
Can you send the Contrave Rx so the PA will initiate and we can get the Insurance information from the pharmacy; last insurance scanned in Epic was 03/15/2015//SLS 04/21 Thanks.

## 2016-02-11 NOTE — Telephone Encounter (Addendum)
Awaiting PA with updated Insurance information from pharmacy to begin process; last insurance scanned in Epic was 03/15/2015 [?]//SLS 04/24 Spoke with patient on Fri, 02/15/16; verified that [employer] Insurance is still the same for 2017, will attempt new PA, as it would not accept online in Cover My Meds/SLS 04/28

## 2016-02-11 NOTE — Telephone Encounter (Signed)
Patient checking on the status of message below °

## 2016-02-15 ENCOUNTER — Telehealth: Payer: Self-pay | Admitting: Physician Assistant

## 2016-02-15 NOTE — Telephone Encounter (Signed)
Caller name: Self  Can be reached: [phone]   Reason for call: Patient request call back about referral to NeuroSurgeon. States she has not received a call and referral was sent 4/21. Plse adv

## 2016-02-15 NOTE — Telephone Encounter (Signed)
Pt aware it may take 2 weeks before she will hear [facility], requesting call back from nurse regarding diet Meds

## 2016-02-18 NOTE — Telephone Encounter (Signed)
Pt called and requested I pull insurance card from husbands account. Pulled new insurance card and scanning into pt acct. Please f/u on PA request.

## 2016-02-19 NOTE — Telephone Encounter (Signed)
Patient was listed in Insurance system [[employer]] only as Lindsey Owen, corrected on Cover My Meds and Initiated Pa request/SLS 05/02

## 2016-02-22 NOTE — Telephone Encounter (Signed)
Received fax with Approval for Contrave; forwarded to pharmacy/SLS 05/05

## 2016-04-29 DIAGNOSIS — M5030 Other cervical disc degeneration, unspecified cervical region: Secondary | ICD-10-CM | POA: Diagnosis not present

## 2016-04-29 DIAGNOSIS — M47816 Spondylosis without myelopathy or radiculopathy, lumbar region: Secondary | ICD-10-CM | POA: Diagnosis not present

## 2016-04-29 DIAGNOSIS — M4722 Other spondylosis with radiculopathy, cervical region: Secondary | ICD-10-CM | POA: Diagnosis not present

## 2016-04-29 DIAGNOSIS — M542 Cervicalgia: Secondary | ICD-10-CM | POA: Diagnosis not present

## 2016-05-05 DIAGNOSIS — M9902 Segmental and somatic dysfunction of thoracic region: Secondary | ICD-10-CM | POA: Diagnosis not present

## 2016-05-05 DIAGNOSIS — M5137 Other intervertebral disc degeneration, lumbosacral region: Secondary | ICD-10-CM | POA: Diagnosis not present

## 2016-05-05 DIAGNOSIS — M5432 Sciatica, left side: Secondary | ICD-10-CM | POA: Diagnosis not present

## 2016-05-05 DIAGNOSIS — M5431 Sciatica, right side: Secondary | ICD-10-CM | POA: Diagnosis not present

## 2016-05-08 DIAGNOSIS — M5137 Other intervertebral disc degeneration, lumbosacral region: Secondary | ICD-10-CM | POA: Diagnosis not present

## 2016-05-08 DIAGNOSIS — M9903 Segmental and somatic dysfunction of lumbar region: Secondary | ICD-10-CM | POA: Diagnosis not present

## 2016-05-08 DIAGNOSIS — M5432 Sciatica, left side: Secondary | ICD-10-CM | POA: Diagnosis not present

## 2016-05-08 DIAGNOSIS — M5431 Sciatica, right side: Secondary | ICD-10-CM | POA: Diagnosis not present

## 2016-05-08 DIAGNOSIS — M9902 Segmental and somatic dysfunction of thoracic region: Secondary | ICD-10-CM | POA: Diagnosis not present

## 2016-05-09 ENCOUNTER — Ambulatory Visit: Payer: BLUE CROSS/BLUE SHIELD

## 2016-05-09 DIAGNOSIS — M5431 Sciatica, right side: Secondary | ICD-10-CM | POA: Diagnosis not present

## 2016-05-09 DIAGNOSIS — M9903 Segmental and somatic dysfunction of lumbar region: Secondary | ICD-10-CM | POA: Diagnosis not present

## 2016-05-09 DIAGNOSIS — M9902 Segmental and somatic dysfunction of thoracic region: Secondary | ICD-10-CM | POA: Diagnosis not present

## 2016-05-12 DIAGNOSIS — M9902 Segmental and somatic dysfunction of thoracic region: Secondary | ICD-10-CM | POA: Diagnosis not present

## 2016-05-12 DIAGNOSIS — M5431 Sciatica, right side: Secondary | ICD-10-CM | POA: Diagnosis not present

## 2016-05-16 DIAGNOSIS — M9903 Segmental and somatic dysfunction of lumbar region: Secondary | ICD-10-CM | POA: Diagnosis not present

## 2016-05-16 DIAGNOSIS — M9902 Segmental and somatic dysfunction of thoracic region: Secondary | ICD-10-CM | POA: Diagnosis not present

## 2016-05-16 DIAGNOSIS — M5431 Sciatica, right side: Secondary | ICD-10-CM | POA: Diagnosis not present

## 2016-06-05 DIAGNOSIS — M5432 Sciatica, left side: Secondary | ICD-10-CM | POA: Diagnosis not present

## 2016-06-05 DIAGNOSIS — M5431 Sciatica, right side: Secondary | ICD-10-CM | POA: Diagnosis not present

## 2016-06-05 DIAGNOSIS — M9902 Segmental and somatic dysfunction of thoracic region: Secondary | ICD-10-CM | POA: Diagnosis not present

## 2016-06-06 DIAGNOSIS — M9903 Segmental and somatic dysfunction of lumbar region: Secondary | ICD-10-CM | POA: Diagnosis not present

## 2016-06-06 DIAGNOSIS — M9902 Segmental and somatic dysfunction of thoracic region: Secondary | ICD-10-CM | POA: Diagnosis not present

## 2016-06-06 DIAGNOSIS — M5432 Sciatica, left side: Secondary | ICD-10-CM | POA: Diagnosis not present

## 2016-06-06 DIAGNOSIS — M5431 Sciatica, right side: Secondary | ICD-10-CM | POA: Diagnosis not present

## 2016-06-09 DIAGNOSIS — M5431 Sciatica, right side: Secondary | ICD-10-CM | POA: Diagnosis not present

## 2016-06-09 DIAGNOSIS — M9902 Segmental and somatic dysfunction of thoracic region: Secondary | ICD-10-CM | POA: Diagnosis not present

## 2016-06-13 DIAGNOSIS — M9903 Segmental and somatic dysfunction of lumbar region: Secondary | ICD-10-CM | POA: Diagnosis not present

## 2016-06-13 DIAGNOSIS — M5137 Other intervertebral disc degeneration, lumbosacral region: Secondary | ICD-10-CM | POA: Diagnosis not present

## 2016-06-13 DIAGNOSIS — M9902 Segmental and somatic dysfunction of thoracic region: Secondary | ICD-10-CM | POA: Diagnosis not present

## 2016-06-13 DIAGNOSIS — M5431 Sciatica, right side: Secondary | ICD-10-CM | POA: Diagnosis not present

## 2016-06-13 DIAGNOSIS — M5432 Sciatica, left side: Secondary | ICD-10-CM | POA: Diagnosis not present

## 2016-06-19 DIAGNOSIS — M9902 Segmental and somatic dysfunction of thoracic region: Secondary | ICD-10-CM | POA: Diagnosis not present

## 2016-06-19 DIAGNOSIS — M5431 Sciatica, right side: Secondary | ICD-10-CM | POA: Diagnosis not present

## 2016-06-20 DIAGNOSIS — M5431 Sciatica, right side: Secondary | ICD-10-CM | POA: Diagnosis not present

## 2016-06-20 DIAGNOSIS — M9902 Segmental and somatic dysfunction of thoracic region: Secondary | ICD-10-CM | POA: Diagnosis not present

## 2016-06-20 DIAGNOSIS — M5137 Other intervertebral disc degeneration, lumbosacral region: Secondary | ICD-10-CM | POA: Diagnosis not present

## 2016-06-20 LAB — HM DIABETES EYE EXAM

## 2016-06-27 DIAGNOSIS — M5431 Sciatica, right side: Secondary | ICD-10-CM | POA: Diagnosis not present

## 2016-06-27 DIAGNOSIS — M9903 Segmental and somatic dysfunction of lumbar region: Secondary | ICD-10-CM | POA: Diagnosis not present

## 2016-06-27 DIAGNOSIS — M5432 Sciatica, left side: Secondary | ICD-10-CM | POA: Diagnosis not present

## 2016-06-27 DIAGNOSIS — M9902 Segmental and somatic dysfunction of thoracic region: Secondary | ICD-10-CM | POA: Diagnosis not present

## 2016-06-27 DIAGNOSIS — M5137 Other intervertebral disc degeneration, lumbosacral region: Secondary | ICD-10-CM | POA: Diagnosis not present

## 2016-06-30 DIAGNOSIS — M9903 Segmental and somatic dysfunction of lumbar region: Secondary | ICD-10-CM | POA: Diagnosis not present

## 2016-06-30 DIAGNOSIS — M5137 Other intervertebral disc degeneration, lumbosacral region: Secondary | ICD-10-CM | POA: Diagnosis not present

## 2016-06-30 DIAGNOSIS — M9902 Segmental and somatic dysfunction of thoracic region: Secondary | ICD-10-CM | POA: Diagnosis not present

## 2016-06-30 DIAGNOSIS — M5431 Sciatica, right side: Secondary | ICD-10-CM | POA: Diagnosis not present

## 2016-07-07 DIAGNOSIS — M9902 Segmental and somatic dysfunction of thoracic region: Secondary | ICD-10-CM | POA: Diagnosis not present

## 2016-07-07 DIAGNOSIS — M9903 Segmental and somatic dysfunction of lumbar region: Secondary | ICD-10-CM | POA: Diagnosis not present

## 2016-07-07 DIAGNOSIS — M5431 Sciatica, right side: Secondary | ICD-10-CM | POA: Diagnosis not present

## 2016-07-10 DIAGNOSIS — M9902 Segmental and somatic dysfunction of thoracic region: Secondary | ICD-10-CM | POA: Diagnosis not present

## 2016-07-10 DIAGNOSIS — M5431 Sciatica, right side: Secondary | ICD-10-CM | POA: Diagnosis not present

## 2016-07-11 DIAGNOSIS — M5137 Other intervertebral disc degeneration, lumbosacral region: Secondary | ICD-10-CM | POA: Diagnosis not present

## 2016-07-11 DIAGNOSIS — M5431 Sciatica, right side: Secondary | ICD-10-CM | POA: Diagnosis not present

## 2016-07-11 DIAGNOSIS — M9902 Segmental and somatic dysfunction of thoracic region: Secondary | ICD-10-CM | POA: Diagnosis not present

## 2016-07-14 ENCOUNTER — Ambulatory Visit (INDEPENDENT_AMBULATORY_CARE_PROVIDER_SITE_OTHER): Payer: BLUE CROSS/BLUE SHIELD | Admitting: Physician Assistant

## 2016-07-14 DIAGNOSIS — E119 Type 2 diabetes mellitus without complications: Secondary | ICD-10-CM

## 2016-07-14 DIAGNOSIS — I10 Essential (primary) hypertension: Secondary | ICD-10-CM | POA: Diagnosis not present

## 2016-07-14 DIAGNOSIS — M9902 Segmental and somatic dysfunction of thoracic region: Secondary | ICD-10-CM | POA: Diagnosis not present

## 2016-07-14 DIAGNOSIS — M5431 Sciatica, right side: Secondary | ICD-10-CM | POA: Diagnosis not present

## 2016-07-14 LAB — COMPREHENSIVE METABOLIC PANEL
ALT: 48 U/L — ABNORMAL HIGH (ref 0–35)
AST: 49 U/L — ABNORMAL HIGH (ref 0–37)
Albumin: 3.9 g/dL (ref 3.5–5.2)
Alkaline Phosphatase: 59 U/L (ref 39–117)
BUN: 7 mg/dL (ref 6–23)
CO2: 31 mEq/L (ref 19–32)
Calcium: 8.8 mg/dL (ref 8.4–10.5)
Chloride: 101 mEq/L (ref 96–112)
Creatinine, Ser: 0.71 mg/dL (ref 0.40–1.20)
GFR: 91.71 mL/min (ref >60.00)
Glucose, Bld: 97 mg/dL (ref 70–99)
Potassium: 4 mEq/L (ref 3.5–5.1)
Sodium: 137 mEq/L (ref 135–145)
Total Bilirubin: 0.7 mg/dL (ref 0.2–1.2)
Total Protein: 7.6 g/dL (ref 6.0–8.3)

## 2016-07-14 LAB — CBC
HCT: 42.6 % (ref 36.0–46.0)
Hemoglobin: 14.4 g/dL (ref 12.0–15.0)
MCHC: 33.8 g/dL (ref 30.0–36.0)
MCV: 86.3 fl (ref 78.0–100.0)
Platelets: 247 10*3/uL (ref 150.0–400.0)
RBC: 4.94 Mil/uL (ref 3.87–5.11)
RDW: 13.6 % (ref 11.5–15.5)
WBC: 7.2 10*3/uL (ref 4.0–10.5)

## 2016-07-14 LAB — VITAMIN B12: Vitamin B-12: 460 pg/mL (ref 211–911)

## 2016-07-14 LAB — TSH: TSH: 1.96 u[IU]/mL (ref 0.35–4.50)

## 2016-07-14 MED ORDER — LEVOTHYROXINE SODIUM 125 MCG PO TABS: 125.0000 ug | ORAL_TABLET | Freq: Every day | ORAL | 3 refills | Status: DC

## 2016-07-14 MED ORDER — METFORMIN HCL 500 MG PO TABS: 500.0000 mg | ORAL_TABLET | Freq: Two times a day (BID) | ORAL | 2 refills | Status: DC

## 2016-07-14 MED ORDER — LISINOPRIL-HYDROCHLOROTHIAZIDE 10-12.5 MG PO TABS: 1.0000 | ORAL_TABLET | Freq: Every day | ORAL | 1 refills | Status: DC

## 2016-07-14 MED ORDER — PREGABALIN 75 MG PO CAPS: 75.0000 mg | ORAL_CAPSULE | Freq: Two times a day (BID) | ORAL | 5 refills | Status: DC

## 2016-07-14 NOTE — Progress Notes (Signed)
Pre visit review using our clinic review tool, if applicable. No additional management support is needed unless otherwise documented below in the visit note. 

## 2016-07-14 NOTE — Patient Instructions (Signed)
Please go to the lab for blood work. I will call with your results.  Please continue medications as directed, starting the new dose of Lyrica. (I have provided an Rx).   Also start an over-the-counter Melatonin supplement to help with sleep. Let me know how this works.  We will try to get notes from the neurosurgeon so we can determine next steps.  You will be contacted for assessment by Ophthalmology.

## 2016-07-14 NOTE — Progress Notes (Signed)
Patient presents to clinic today for follow-up and medication management.  Hypertension -- Patient is currently on regimen of lisinopril-HCTZ. Is taking as directed. Patient denies chest pain, palpitations, lightheadedness, dizziness, vision changes or frequent headaches. Has previously been well controlled on this regimen. Noting some increased pain today.  BP Readings from Last 3 Encounters:  07/14/16 (!) 146/92  02/04/16 122/88  11/23/15 (!) 150/100   Hypothyroidism -- Is on levothyroxine 125 mcg daily. Is taking as directed. Is due for repeat TSH level. Denies constipation, dryness of skin. Does note some decline in mood secondary to multiple stressors. Denies depressed mood overall. Is noting insomnia 2/2 pain and fatigue 2/2 insomnia. Would like to discuss assessment and options for treatment.  Diabetes Mellitus II -- Patient currently on Metformin 500 mg twice daily. Endorses taking medications as directed. Is staying active at work and trying to work on diet to help with weight loss. Body mass index is 37.06 kg/m.   Past Medical History:  Diagnosis Date  . Diabetes mellitus without complication ([place])   . Hypertension   . Thyroid disease     Current Outpatient Prescriptions on File Prior to Visit  Medication Sig Dispense Refill  . Calcium Carbonate-Vit D-Min (CALCIUM 1200 PO) Take by mouth.    . ferrous sulfate 325 (65 FE) MG tablet Take 1 tablet by mouth daily.    Marland Kitchen gabapentin (NEURONTIN) 300 MG capsule Take 300 mg by mouth 3 (three) times daily.    Marland Kitchen glucose blood test strip CONTOUR BLOOD GLUCOSE TEST STRIP.  USE ONE STRIP TO CHECK GLUCOSE TWICE DAILY. 100 each 12  . levothyroxine (SYNTHROID, LEVOTHROID) 125 MCG tablet Take 1 tablet (125 mcg total) by mouth daily before breakfast. 30 tablet 3  . lisinopril-hydrochlorothiazide (PRINZIDE,ZESTORETIC) 10-12.5 MG tablet Take 1 tablet by mouth daily. 90 tablet 1  . Lorcaserin HCl (BELVIQ) 10 MG TABS Take 1 tablet by mouth 2 (two)  times daily. 60 tablet 1  . metFORMIN (GLUCOPHAGE) 500 MG tablet Take 1 tablet (500 mg total) by mouth 2 (two) times daily with a meal. 60 tablet 2  . Naltrexone-Bupropion HCl ER 8-90 MG TB12 Take 1 tablet by mouth 2 (two) times daily. 60 tablet 1  . naproxen sodium (ANAPROX) 550 MG tablet Take 1 tablet (550 mg total) by mouth 2 (two) times daily with a meal. 60 tablet 1  . Omega-3 Fatty Acids (FISH OIL) 1000 MG CAPS Take by mouth.    . pregabalin (LYRICA) 50 MG capsule Take 1 capsule (50 mg total) by mouth 2 (two) times daily. 60 capsule 0  . fluticasone (FLONASE) 50 MCG/ACT nasal spray Place 2 sprays into both nostrils daily. (Patient not taking: Reported on 07/14/2016) 16 g 6   No current facility-administered medications on file prior to visit.     No Known Allergies  Family History  Problem Relation Age of Onset  . Kidney disease Mother     Kidney Failure  . Heart disease Father     MI    Social History   Social History  . Marital status: Married    Spouse name: N/A  . Number of children: N/A  . Years of education: N/A   Social History Main Topics  . Smoking status: Never Smoker  . Smokeless tobacco: Not on file  . Alcohol use Not on file  . Drug use: No  . Sexual activity: Not on file   Other Topics Concern  . Not on file   Social History  Narrative  . No narrative on file    Review of Systems - See HPI.  All other ROS are negative.  BP (!) 152/100 (BP Location: Left Arm, Patient Position: Sitting, Cuff Size: Normal)   Pulse 65   Temp 98.3 F (36.8 C) (Oral)   Wt 202 lb 9.6 oz (91.9 kg)   SpO2 98%   BMI 37.06 kg/m   Physical Exam  Constitutional: She is oriented to person, place, and time and well-developed, well-nourished, and in no distress.  HENT:  Head: Normocephalic and atraumatic.  Eyes: Conjunctivae are normal. Pupils are equal, round, and reactive to light.  Neck: No thyromegaly present.  Cardiovascular: Normal rate, regular rhythm, normal heart  sounds and intact distal pulses.   Pulmonary/Chest: Effort normal and breath sounds normal. No respiratory distress. She has no wheezes. She has no rales. She exhibits no tenderness.  Neurological: She is alert and oriented to person, place, and time.  Skin: Skin is warm and dry. No rash noted.  Psychiatric: Affect normal.  Vitals reviewed.  Assessment/Plan: 1. Essential hypertension BP previously well controlled with current regimen. Is having increased pain in neck and with neuropathy. Lyrica has been increased. Will work on pain control. FU scheduled. - lisinopril-hydrochlorothiazide (PRINZIDE,ZESTORETIC) 10-12.5 MG tablet; Take 1 tablet by mouth daily.  Dispense: 90 tablet; Refill: 1 - CBC - B12  2. Controlled type 2 diabetes mellitus without complication, without long-term current use of insulin (HCC) Last A1C at 6.7. Will continue current medications regimen. Lyrica increased to help with nerve pain. Will obtain lab panel today. Referral placed for diabetic eye examination.  Lab Results  Component Value Date   HGBA1C 6.7 (H) 02/04/2016   - metFORMIN (GLUCOPHAGE) 500 MG tablet; Take 1 tablet (500 mg total) by mouth 2 (two) times daily with a meal.  Dispense: 60 tablet; Refill: 2 - TSH - Comp Met (CMET)   Leeanne Rio, PA-C

## 2016-07-18 ENCOUNTER — Other Ambulatory Visit: Payer: Self-pay | Admitting: Physician Assistant

## 2016-07-18 DIAGNOSIS — R7989 Other specified abnormal findings of blood chemistry: Secondary | ICD-10-CM

## 2016-07-18 DIAGNOSIS — R945 Abnormal results of liver function studies: Secondary | ICD-10-CM

## 2016-07-18 DIAGNOSIS — E119 Type 2 diabetes mellitus without complications: Secondary | ICD-10-CM

## 2016-07-19 ENCOUNTER — Encounter: Payer: Self-pay | Admitting: Physician Assistant

## 2016-07-21 ENCOUNTER — Ambulatory Visit (HOSPITAL_BASED_OUTPATIENT_CLINIC_OR_DEPARTMENT_OTHER)
Admission: RE | Admit: 2016-07-21 | Discharge: 2016-07-21 | Disposition: A | Discharge status: Home / Self Care | Payer: BLUE CROSS/BLUE SHIELD | Source: Ambulatory Visit | Attending: Physician Assistant | Admitting: Physician Assistant

## 2016-07-21 DIAGNOSIS — R7989 Other specified abnormal findings of blood chemistry: Secondary | ICD-10-CM

## 2016-07-21 DIAGNOSIS — M9902 Segmental and somatic dysfunction of thoracic region: Secondary | ICD-10-CM | POA: Diagnosis not present

## 2016-07-21 DIAGNOSIS — M5431 Sciatica, right side: Secondary | ICD-10-CM | POA: Diagnosis not present

## 2016-07-21 DIAGNOSIS — M5137 Other intervertebral disc degeneration, lumbosacral region: Secondary | ICD-10-CM | POA: Diagnosis not present

## 2016-07-21 DIAGNOSIS — M9903 Segmental and somatic dysfunction of lumbar region: Secondary | ICD-10-CM | POA: Diagnosis not present

## 2016-07-21 DIAGNOSIS — R945 Abnormal results of liver function studies: Secondary | ICD-10-CM

## 2016-07-21 DIAGNOSIS — M5432 Sciatica, left side: Secondary | ICD-10-CM | POA: Diagnosis not present

## 2016-07-23 ENCOUNTER — Telehealth: Payer: Self-pay | Admitting: *Deleted

## 2016-07-23 ENCOUNTER — Other Ambulatory Visit: Payer: Self-pay | Admitting: Physician Assistant

## 2016-07-23 DIAGNOSIS — R748 Abnormal levels of other serum enzymes: Secondary | ICD-10-CM

## 2016-07-23 DIAGNOSIS — R7989 Other specified abnormal findings of blood chemistry: Secondary | ICD-10-CM

## 2016-07-23 DIAGNOSIS — R945 Abnormal results of liver function studies: Secondary | ICD-10-CM

## 2016-07-23 NOTE — Telephone Encounter (Signed)
-----   Message from Waldon MerlWilliam C Martin, PA-C sent at 07/23/2016  3:09 PM EDT ----- Liver US negative for lesion or atypical liver findings.  Would like her to increase exercise and continue diet low in saturated fats and cholesterol. Would like to repeat LFT in 2-3 weeks.

## 2016-07-23 NOTE — Telephone Encounter (Signed)
Patient informed, understood & agreed; future lab order placed/SLS 10/04

## 2016-07-28 DIAGNOSIS — M5432 Sciatica, left side: Secondary | ICD-10-CM | POA: Diagnosis not present

## 2016-07-28 DIAGNOSIS — M5137 Other intervertebral disc degeneration, lumbosacral region: Secondary | ICD-10-CM | POA: Diagnosis not present

## 2016-07-28 DIAGNOSIS — M9903 Segmental and somatic dysfunction of lumbar region: Secondary | ICD-10-CM | POA: Diagnosis not present

## 2016-07-28 DIAGNOSIS — M9902 Segmental and somatic dysfunction of thoracic region: Secondary | ICD-10-CM | POA: Diagnosis not present

## 2016-07-28 DIAGNOSIS — M5431 Sciatica, right side: Secondary | ICD-10-CM | POA: Diagnosis not present

## 2016-08-04 DIAGNOSIS — H2513 Age-related nuclear cataract, bilateral: Secondary | ICD-10-CM | POA: Diagnosis not present

## 2016-08-04 DIAGNOSIS — E119 Type 2 diabetes mellitus without complications: Secondary | ICD-10-CM | POA: Diagnosis not present

## 2016-08-04 DIAGNOSIS — H04123 Dry eye syndrome of bilateral lacrimal glands: Secondary | ICD-10-CM | POA: Diagnosis not present

## 2016-08-25 DIAGNOSIS — M9903 Segmental and somatic dysfunction of lumbar region: Secondary | ICD-10-CM | POA: Diagnosis not present

## 2016-08-25 DIAGNOSIS — M5432 Sciatica, left side: Secondary | ICD-10-CM | POA: Diagnosis not present

## 2016-08-25 DIAGNOSIS — M5137 Other intervertebral disc degeneration, lumbosacral region: Secondary | ICD-10-CM | POA: Diagnosis not present

## 2016-08-25 DIAGNOSIS — M9902 Segmental and somatic dysfunction of thoracic region: Secondary | ICD-10-CM | POA: Diagnosis not present

## 2016-08-25 DIAGNOSIS — M5431 Sciatica, right side: Secondary | ICD-10-CM | POA: Diagnosis not present

## 2016-10-23 ENCOUNTER — Telehealth: Payer: Self-pay | Admitting: Medical

## 2016-10-23 NOTE — Telephone Encounter (Signed)
Opened to review communication section.

## 2016-10-23 NOTE — Telephone Encounter (Signed)
Work excuse written for mom of pt today.

## 2017-02-06 ENCOUNTER — Telehealth: Payer: Self-pay | Admitting: Physician Assistant

## 2017-02-06 IMAGING — US US ABDOMEN LIMITED
1 series · 14 of 25 positions shown · non-contrast
Comparison: None.

CLINICAL DATA: Elevated LFTs.  Diabetes, hypertension

EXAM:
US ABDOMEN LIMITED - RIGHT UPPER QUADRANT

[Series 1: us abdomen limited · 0.21mm/px · 14 of 36 slices shown]
[im 1/36]
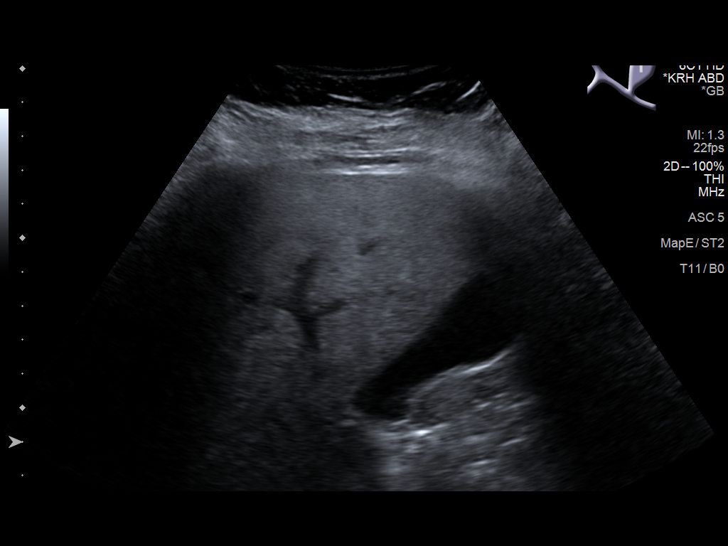
[im 3/36]
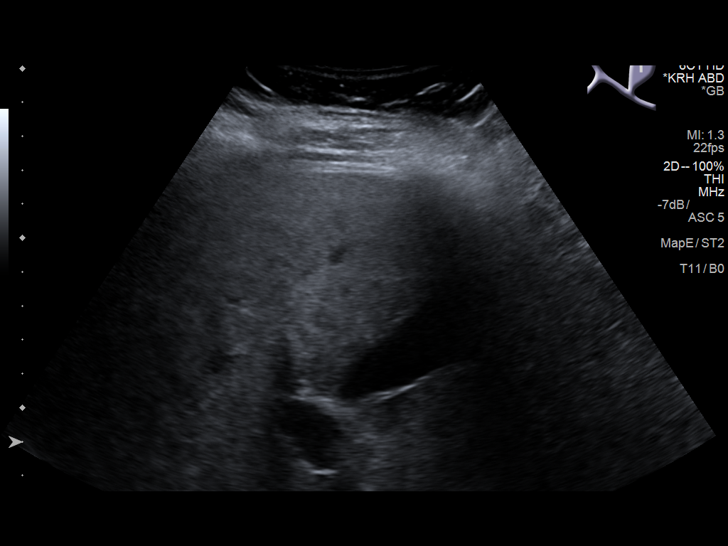
[im 6/36]
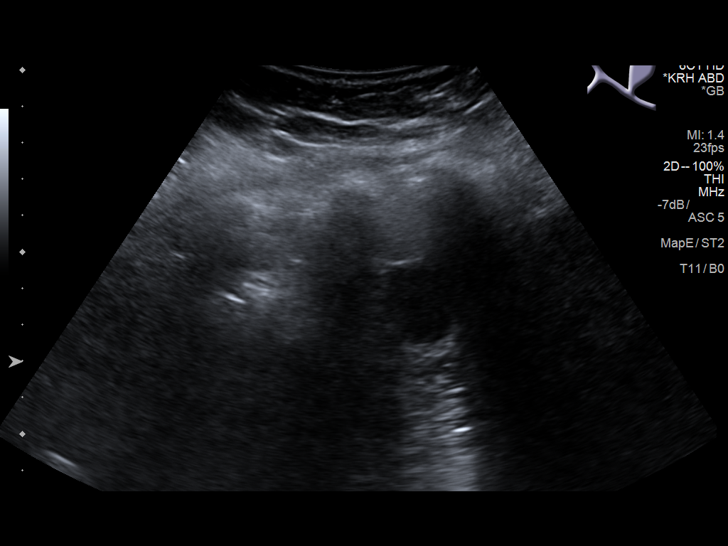
[im 9/36]
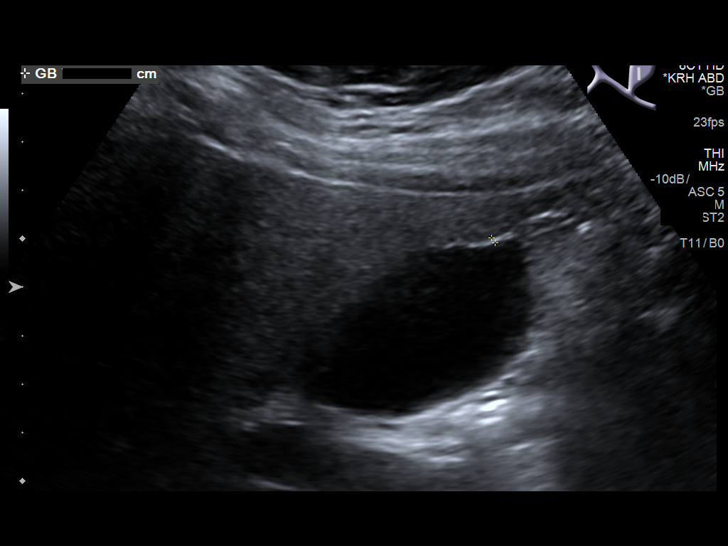
[im 12/36]
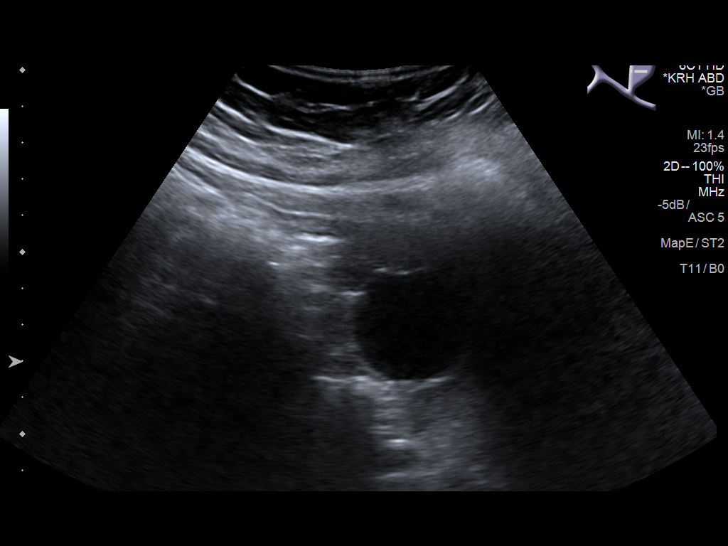
[im 14/36]
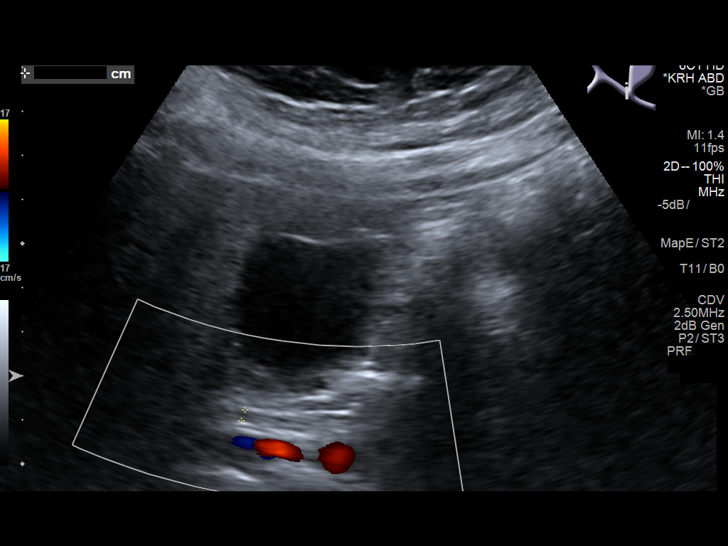
[im 17/36]
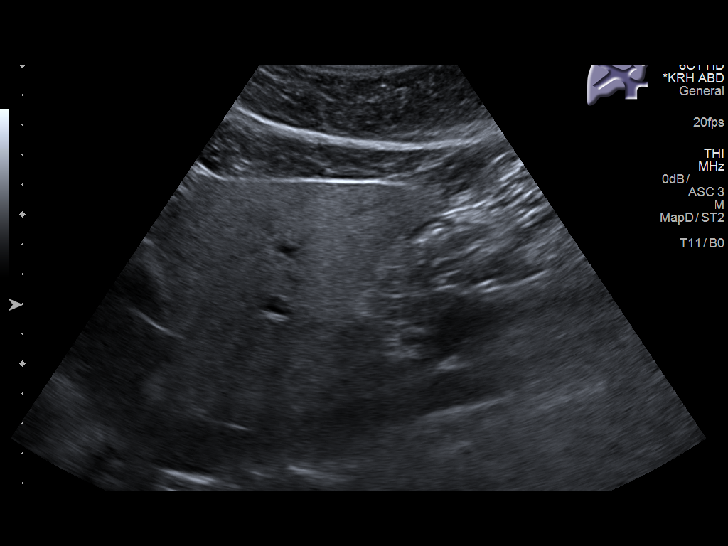
[im 19/36]
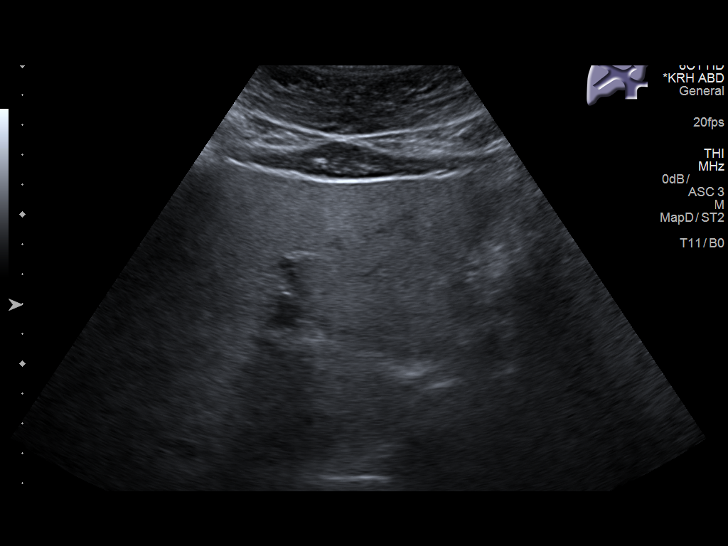
[im 22/36]
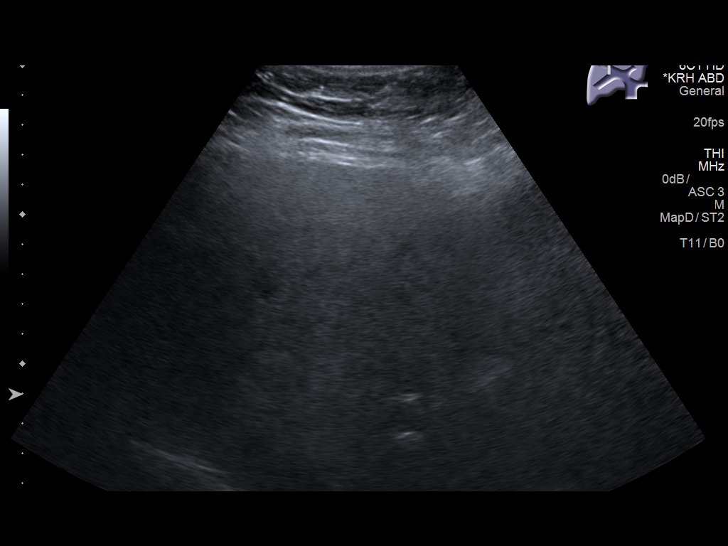
[im 24/36]
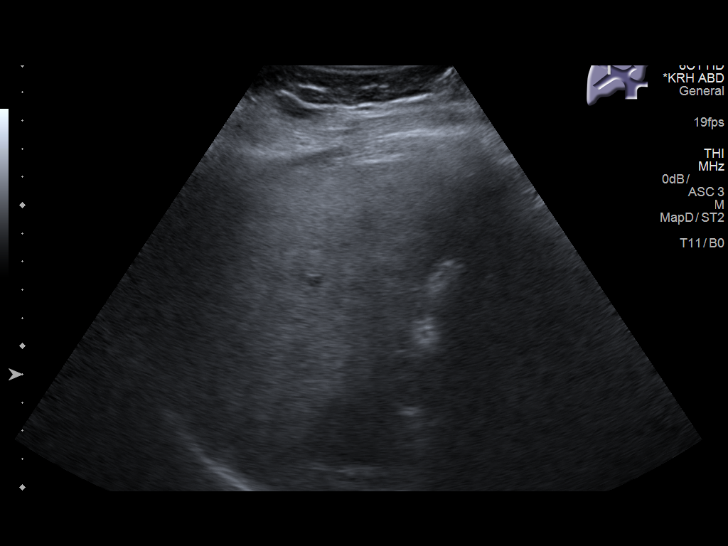
[im 27/36]
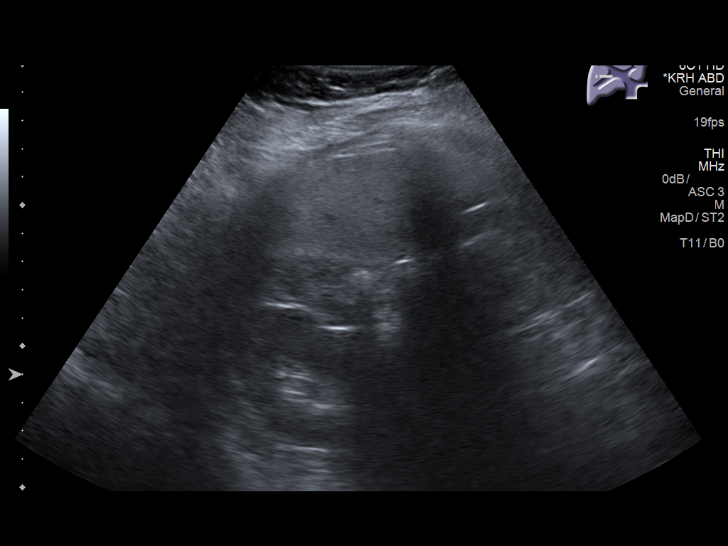
[im 30/36]
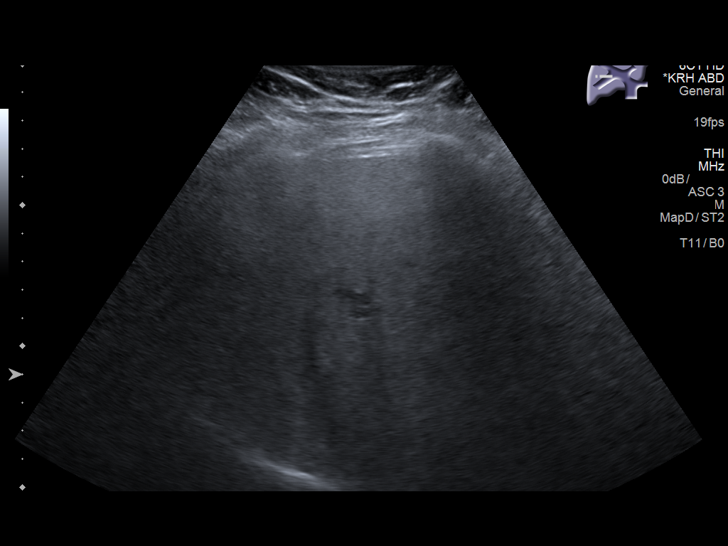
[im 33/36]
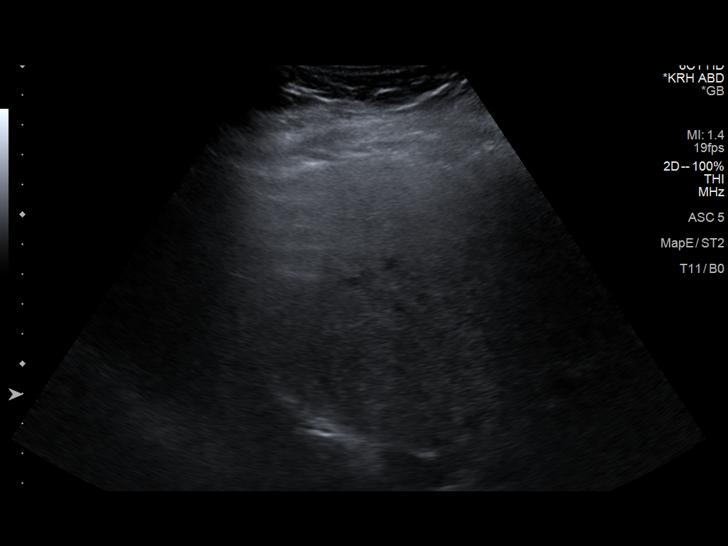
[im 36/36]
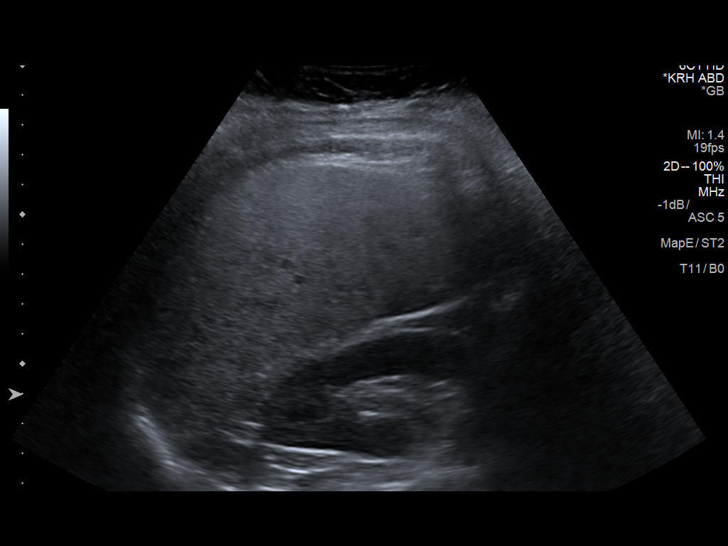

[14 of 25 positions shown; findings below may reference images not displayed]

FINDINGS: Gallbladder:

No gallstones or wall thickening visualized. No sonographic Murphy
sign noted by sonographer.

Common bile duct:

Diameter: 3 mm, unremarkable in its visualized segments

Liver:

No focal lesion or intrahepatic biliary ductal dilatation.
Parenchyma is mildly echogenic.
IMPRESSION: 1. No mass, stones, or acute findings.

## 2017-02-06 NOTE — Telephone Encounter (Signed)
Pt says that she would like to switch providers due to her pcp being at a different location.   Is switch okay?   Pt would like to have CPE if possible.    CB: [phone]

## 2017-02-06 NOTE — Telephone Encounter (Signed)
Ok with me 

## 2017-02-06 NOTE — Telephone Encounter (Signed)
OK w me.  

## 2017-02-09 ENCOUNTER — Other Ambulatory Visit: Payer: Self-pay | Admitting: Physician Assistant

## 2017-02-09 ENCOUNTER — Encounter: Payer: Self-pay | Admitting: Family Medicine

## 2017-02-09 ENCOUNTER — Ambulatory Visit (INDEPENDENT_AMBULATORY_CARE_PROVIDER_SITE_OTHER): Payer: BLUE CROSS/BLUE SHIELD | Admitting: Family Medicine

## 2017-02-09 VITALS — BP 126/70 | HR 67 | Temp 97.8°F | Ht 62.0 in | Wt 187.4 lb

## 2017-02-09 DIAGNOSIS — Z23 Encounter for immunization: Secondary | ICD-10-CM

## 2017-02-09 DIAGNOSIS — I10 Essential (primary) hypertension: Secondary | ICD-10-CM

## 2017-02-09 DIAGNOSIS — E038 Other specified hypothyroidism: Secondary | ICD-10-CM | POA: Diagnosis not present

## 2017-02-09 DIAGNOSIS — E119 Type 2 diabetes mellitus without complications: Secondary | ICD-10-CM

## 2017-02-09 DIAGNOSIS — Z9109 Other allergy status, other than to drugs and biological substances: Secondary | ICD-10-CM

## 2017-02-09 LAB — LIPID PANEL
Cholesterol: 179 mg/dL (ref 0–200)
HDL: 53.2 mg/dL (ref >39.00)
LDL Cholesterol: 102 mg/dL — ABNORMAL HIGH (ref 0–99)
NonHDL: 125.63
Total CHOL/HDL Ratio: 3
Triglycerides: 117 mg/dL (ref 0.0–149.0)
VLDL: 23.4 mg/dL (ref 0.0–40.0)

## 2017-02-09 LAB — CBC
HCT: 43.7 % (ref 36.0–46.0)
Hemoglobin: 14.8 g/dL (ref 12.0–15.0)
MCHC: 33.9 g/dL (ref 30.0–36.0)
MCV: 87 fl (ref 78.0–100.0)
Platelets: 215 10*3/uL (ref 150.0–400.0)
RBC: 5.02 Mil/uL (ref 3.87–5.11)
RDW: 13.7 % (ref 11.5–15.5)
WBC: 7 10*3/uL (ref 4.0–10.5)

## 2017-02-09 LAB — COMPREHENSIVE METABOLIC PANEL
ALT: 32 U/L (ref 0–35)
AST: 34 U/L (ref 0–37)
Albumin: 4 g/dL (ref 3.5–5.2)
Alkaline Phosphatase: 52 U/L (ref 39–117)
BUN: 15 mg/dL (ref 6–23)
CO2: 32 mEq/L (ref 19–32)
Calcium: 9.8 mg/dL (ref 8.4–10.5)
Chloride: 98 mEq/L (ref 96–112)
Creatinine, Ser: 0.87 mg/dL (ref 0.40–1.20)
GFR: 72.37 mL/min (ref >60.00)
Glucose, Bld: 122 mg/dL — ABNORMAL HIGH (ref 70–99)
Potassium: 4 mEq/L (ref 3.5–5.1)
Sodium: 136 mEq/L (ref 135–145)
Total Bilirubin: 0.9 mg/dL (ref 0.2–1.2)
Total Protein: 7.7 g/dL (ref 6.0–8.3)

## 2017-02-09 LAB — MICROALBUMIN / CREATININE URINE RATIO
Creatinine,U: 285.3 mg/dL
Microalb Creat Ratio: 0.6 mg/g (ref 0.0–30.0)
Microalb, Ur: 1.6 mg/dL (ref 0.0–1.9)

## 2017-02-09 LAB — TSH: TSH: 4.36 u[IU]/mL (ref 0.35–4.50)

## 2017-02-09 LAB — HEMOGLOBIN A1C: Hgb A1c MFr Bld: 6.7 % — ABNORMAL HIGH (ref 4.6–6.5)

## 2017-02-09 MED ORDER — PREGABALIN 75 MG PO CAPS: 75.0000 mg | ORAL_CAPSULE | Freq: Two times a day (BID) | ORAL | 5 refills | Status: DC

## 2017-02-09 MED ORDER — LEVOTHYROXINE SODIUM 125 MCG PO TABS: 125.0000 ug | ORAL_TABLET | Freq: Every day | ORAL | 3 refills | Status: DC

## 2017-02-09 MED ORDER — ATORVASTATIN CALCIUM 40 MG PO TABS: 40.0000 mg | ORAL_TABLET | Freq: Every day | ORAL | 3 refills | Status: DC

## 2017-02-09 MED ORDER — FLUTICASONE PROPIONATE 50 MCG/ACT NA SUSP: 2.0000 | Freq: Every day | NASAL | 6 refills | Status: DC

## 2017-02-09 MED ORDER — METFORMIN HCL 500 MG PO TABS: 500.0000 mg | ORAL_TABLET | Freq: Two times a day (BID) | ORAL | 1 refills | Status: DC

## 2017-02-09 MED ORDER — LISINOPRIL-HYDROCHLOROTHIAZIDE 10-12.5 MG PO TABS: 1.0000 | ORAL_TABLET | Freq: Every day | ORAL | 1 refills | Status: DC

## 2017-02-09 NOTE — Progress Notes (Signed)
Pre visit review using our clinic review tool, if applicable. No additional management support is needed unless otherwise documented below in the visit note. 

## 2017-02-09 NOTE — Patient Instructions (Addendum)
Only check sugars if you feel "off".  Around 3 times per week, check your blood pressure 4 times per day. Twice in the morning and twice in the evening. The readings should be at least one minute apart. Write down these values and bring them to your next nurse visit/appointment. Bring your BP monitor to your visit as well.   When you check your BP, make sure you have been doing something calm/relaxing 5 minutes prior to checking. Both feet should be flat on the floor and you should be sitting. Use your left arm and make sure it is in a relaxed position (on a table), and that the cuff is at the approximate level/height of your heart.

## 2017-02-09 NOTE — Progress Notes (Signed)
Subjective:   Chief Complaint  Patient presents with  . Transitions Of Care    f/u on BP and DM    Lindsey Owen is a 51 y.o. female here for follow-up of diabetes.   Lindsey Owen's self monitored glucose range is low 100's Patient denies hypoglycemic reactions. She checks her glucose levels twice per week. Patient does not require insulin.   Medications include: Metformin 500 mg daily Patient exercises 0 days per week on average.   She does take an aspirin daily. Statin? No ACEi/ARB? Yes  Hypertension Patient presents for hypertension follow up. She does monitor home blood pressures. Blood pressures ranging on average from 140's/90's. She is compliant with medications. Patient has these side effects of medication: none She is adhering to a healthy diet overall. Exercise: active at work  Hypothyroidism Patient presents for follow-up of hypothyroidism.  Reports compliance with medication- 125 mcg Synthroid. Current symptoms include: some heat intolerance Denies: weight gain, constipation, swelling, losing hair, palpitations and sweating She believes her dose should be unchanged   Past Medical History:  Diagnosis Date  . Diabetes mellitus without complication (HCC)   . Hypertension   . Thyroid disease     Past Surgical History:  Procedure Laterality Date  . CESAREAN SECTION  2000  . UTERINE FIBROID SURGERY      Social History   Social History  . Marital status: Married   Social History Main Topics  . Smoking status: Never Smoker  . Smokeless tobacco: Never Used  . Alcohol use No  . Drug use: No   Current Outpatient Prescriptions on File Prior to Visit  Medication Sig Dispense Refill  . Calcium Carbonate-Vit D-Min (CALCIUM 1200 PO) Take by mouth.    . ferrous sulfate 325 (65 FE) MG tablet Take 1 tablet by mouth daily.    . fluticasone (FLONASE) 50 MCG/ACT nasal spray Place 2 sprays into both nostrils daily. 16 g 6  . gabapentin (NEURONTIN) 300 MG capsule Take  300 mg by mouth 3 (three) times daily.    Marland Kitchen glucose blood test strip CONTOUR BLOOD GLUCOSE TEST STRIP.  USE ONE STRIP TO CHECK GLUCOSE TWICE DAILY. 100 each 12  . levothyroxine (SYNTHROID, LEVOTHROID) 125 MCG tablet Take 1 tablet (125 mcg total) by mouth daily before breakfast. 30 tablet 3  . lisinopril-hydrochlorothiazide (PRINZIDE,ZESTORETIC) 10-12.5 MG tablet Take 1 tablet by mouth daily. 90 tablet 1  . metFORMIN (GLUCOPHAGE) 500 MG tablet Take 1 tablet (500 mg total) by mouth 2 (two) times daily with a meal. 60 tablet 2  . Omega-3 Fatty Acids (FISH OIL) 1000 MG CAPS Take by mouth.    . pregabalin (LYRICA) 75 MG capsule Take 1 capsule (75 mg total) by mouth 2 (two) times daily. (Patient not taking: Reported on 02/09/2017) 60 capsule 5   Related testing: Foot exam(monofilament and inspection):done Retinal exam: done Date of retinal exam: 05/2016  Done by:  Dr. Allena Katz Pneumovax: will be done today  Review of Systems: Eye:  No recent significant change in vision Pulmonary:  No SOB Cardiovascular:  No chest pain, no palpitations Skin/Integumentary ROS:  No abnormal skin lesions reported Neurologic:  No numbness, tingling  Objective:  BP 126/70 (BP Location: Left Arm, Patient Position: Sitting, Cuff Size: Normal)   Pulse 67   Temp 97.8 F (36.6 C) (Oral)   Ht  (1.575 m)   Wt 187 lb 6.4 oz (85 kg)   SpO2 98%   BMI 34.28 kg/m  General:  Well developed, well nourished,  in no apparent distress Skin:  Warm, no pallor or diaphoresis Head:  Normocephalic, atraumatic Eyes:  Pupils equal and round, sclera anicteric without injection  Nose:  External nares without trauma, no discharge Throat/Pharynx:  Lips and gingiva without lesion Neck: Neck supple.  No obvious thyromegaly or masses.  No bruits Lungs:  clear to auscultation, breath sounds equal bilaterally, no wheezes, rales, or stridor Cardio:  regular rate and rhythm without murmurs, no bruits, no LE edema Abdomen:  Abdomen soft,  non-tender, BS normal Musculoskeletal:  Symmetrical muscle groups noted without atrophy or deformity Neuro:  Sensation intact to pinprick on feet Psych: Age appropriate judgment and insight  Assessment:   Controlled type 2 diabetes mellitus without complication, without long-term current use of insulin (HCC) - Plan: atorvastatin (LIPITOR) 40 MG tablet, Lipid panel, Hemoglobin A1c, CBC, Microalbumin / creatinine urine ratio, metFORMIN (GLUCOPHAGE) 500 MG tablet, HM Diabetes Foot Exam  Essential hypertension - Plan: Comprehensive metabolic panel, lisinopril-hydrochlorothiazide (PRINZIDE,ZESTORETIC) 10-12.5 MG tablet  Other specified hypothyroidism - Plan: TSH  Need for vaccination against Streptococcus pneumoniae - Plan: Pneumococcal polysaccharide vaccine 23-valent greater than or equal to 2yo subcutaneous/IM  Environmental allergies - Plan: fluticasone (FLONASE) 50 MCG/ACT nasal spray   Plan:   Orders as above. PCV 23.  Appears to be well controlled with all of the above. Home readings for BP not consistent, home reading discussion and written instructions given. F/u in 2 weeks for CPE, will update health maintenance deficiencies. The patient voiced understanding and agreement to the plan.  NichoJilda Rocheountain Top, DO 02/09/17 1:40 PM

## 2017-02-23 ENCOUNTER — Ambulatory Visit (INDEPENDENT_AMBULATORY_CARE_PROVIDER_SITE_OTHER): Payer: BLUE CROSS/BLUE SHIELD | Admitting: Family Medicine

## 2017-02-23 ENCOUNTER — Encounter: Payer: Self-pay | Admitting: Family Medicine

## 2017-02-23 VITALS — BP 116/80 | HR 65 | Temp 98.1°F | Ht 62.0 in | Wt 190.6 lb

## 2017-02-23 DIAGNOSIS — Z1239 Encounter for other screening for malignant neoplasm of breast: Secondary | ICD-10-CM

## 2017-02-23 DIAGNOSIS — Z1159 Encounter for screening for other viral diseases: Secondary | ICD-10-CM | POA: Diagnosis not present

## 2017-02-23 DIAGNOSIS — S46812A Strain of other muscles, fascia and tendons at shoulder and upper arm level, left arm, initial encounter: Secondary | ICD-10-CM | POA: Diagnosis not present

## 2017-02-23 DIAGNOSIS — Z0000 Encounter for general adult medical examination without abnormal findings: Secondary | ICD-10-CM

## 2017-02-23 DIAGNOSIS — Z1231 Encounter for screening mammogram for malignant neoplasm of breast: Secondary | ICD-10-CM | POA: Diagnosis not present

## 2017-02-23 DIAGNOSIS — Z23 Encounter for immunization: Secondary | ICD-10-CM | POA: Diagnosis not present

## 2017-02-23 DIAGNOSIS — E119 Type 2 diabetes mellitus without complications: Secondary | ICD-10-CM

## 2017-02-23 MED ORDER — PREGABALIN 75 MG PO CAPS: 75.0000 mg | ORAL_CAPSULE | Freq: Every day | ORAL | 5 refills | Status: DC

## 2017-02-23 MED ORDER — GLUCOSE BLOOD VI STRP: ORAL_STRIP | 12 refills | Status: DC

## 2017-02-23 MED ORDER — GABAPENTIN 300 MG PO CAPS: 300.0000 mg | ORAL_CAPSULE | Freq: Every day | ORAL | 1 refills | Status: DC

## 2017-02-23 NOTE — Progress Notes (Signed)
Chief Complaint  Patient presents with  . Annual Exam    pt had coffee(cream and sugar)    Well Woman Lindsey Owen is here for a complete physical.   Her last physical was >1 year ago.  Current diet: in general, a "healthy" diet  . Current exercise: house work.  Weight is stable and she denies daytime fatigue. No LMP recorded. Patient is postmenopausal. Seatbelt? Yes  Pt states that she has decreased ROM and pain in L shoulder that is chronic. No injury or change in activity.  Health Maintenance Pap/HPV- Yes - follows with GYN Mammogram- No Tetanus- No  Hep C- No HIV- Yes 1996  Past Medical History:  Diagnosis Date  . Diabetes mellitus without complication (HCC)   . Hypertension   . Thyroid disease     Past Surgical History:  Procedure Laterality Date  . CESAREAN SECTION  2000  . UTERINE FIBROID SURGERY     Medications  Current Outpatient Prescriptions on File Prior to Visit  Medication Sig Dispense Refill  . atorvastatin (LIPITOR) 40 MG tablet Take 1 tablet (40 mg total) by mouth daily. 90 tablet 3  . Calcium Carbonate-Vit D-Min (CALCIUM 1200 PO) Take by mouth.    . ferrous sulfate 325 (65 FE) MG tablet Take 1 tablet by mouth daily.    . fluticasone (FLONASE) 50 MCG/ACT nasal spray Place 2 sprays into both nostrils daily. 16 g 6  . glucose blood test strip CONTOUR BLOOD GLUCOSE TEST STRIP.  USE ONE STRIP TO CHECK GLUCOSE TWICE DAILY. 100 each 12  . levothyroxine (SYNTHROID, LEVOTHROID) 125 MCG tablet Take 1 tablet (125 mcg total) by mouth daily before breakfast. 90 tablet 3  . lisinopril-hydrochlorothiazide (PRINZIDE,ZESTORETIC) 10-12.5 MG tablet Take 1 tablet by mouth daily. 90 tablet 1  . metFORMIN (GLUCOPHAGE) 500 MG tablet Take 1 tablet (500 mg total) by mouth 2 (two) times daily with a meal. 180 tablet 1  . Omega-3 Fatty Acids (FISH OIL) 1000 MG CAPS Take by mouth.    . pregabalin (LYRICA) 75 MG capsule Take 1 capsule (75 mg total) by mouth 2 (two) times daily.  60 capsule 5   Allergies No Known Allergies  Review of Systems: Constitutional:  no unexpected change in weight, no weakness, no unexplained fevers, sweats, or chills Eye:  no recent significant change in vision Ear/Nose/Mouth/Throat:  Ears:  no tinnitus or vertigo and no recent change in hearing, Nose/Mouth/Throat:  no complaints of nasal congestion or discharge, no sore throat and no recent change in voice or hoarseness Cardiovascular:  no exercise intolerance, no chest pain, no palpitations Respiratory:  no chronic cough, sputum, or hemoptysis and no shortness of breath Gastrointestinal:  no abdominal pain, no change in bowel habits, no significant change in appetite, no nausea, vomiting, diarrhea, or constipation and no black or bloody stool GU:  Female: negative for dysuria, frequency, and incontinence, Normal menses; no abnormal bleeding, pelvic pain, or discharge Musculoskeletal/Extremities: + Left shoulder pain; otherwise no pain, redness, or swelling of the joints Integumentary (Skin/Breast):  no abnormal skin lesions reported, no new breast lumps or masses Neurologic:  + Intermittent numbness of hands; otherwise no chronic headaches, no numbness, tingling, or tremor Psychiatric:  no anxiety, no depression Endocrine:  denies fatigue, weight changes, heat/cold intolerance, bowel or skin changes, or cardiovascular system symptoms Hematologic/Lymphatic:  no abnormal bleeding, no HIV risk factors, no night sweats, no swollen nodes, no weight loss Allergic/Immunologic:  no history of food or environmental allergies  Exam BP 116/80 (  BP Location: Left Arm, Patient Position: Sitting, Cuff Size: Normal)   Pulse 65   Temp 98.1 F (36.7 C) (Oral)   Ht 5\' 2"  (1.575 m)   Wt 190 lb 9.6 oz (86.5 kg)   SpO2 98%   BMI 34.86 kg/m  General:  well developed, well nourished, in no apparent distress Skin:  no significant moles, warts, or growths Head:  no masses, lesions, or tenderness Eyes:   pupils equal and round, sclera anicteric without injection Ears:  canals without lesions, TMs shiny without retraction, no obvious effusion, no erythema Nose:  nares patent, septum midline, mucosa normal, and no drainage or sinus tenderness Throat/Pharynx:  lips and gingiva without lesion; tongue and uvula midline; non-inflamed pharynx; no exudates or postnasal drainage Neck: neck supple without adenopathy, thyromegaly, or masses Breasts: not done Thorax:  nontender Lungs:  clear to auscultation, breath sounds equal bilaterally, no respiratory distress Cardio:  regular rate and rhythm without murmurs, heart sounds without clicks or rubs, point of maximal impulse normal; no lifts, heaves, or thrills Abdomen:  abdomen soft, nontender; bowel sounds normal; no masses or organomegaly Genital: not done Musculoskeletal:  +TTP over left trapezius; otherwise symmetrical muscle groups noted without atrophy or deformity Extremities:  no clubbing, cyanosis, or edema, no deformities, no skin discoloration Neuro:  gait normal; deep tendon reflexes normal and symmetric Psych: well oriented with normal range of affect and appropriate judgment/insight  Assessment and Plan  Well adult exam  Strain of left trapezius muscle, initial encounter  Need for hepatitis C screening test - Plan: Hepatitis C antibody  Need for Tdap vaccination - Plan: Tdap vaccine greater than or equal to 7yo IM  Screening for malignant neoplasm of breast - Plan: MM DIGITAL SCREENING BILATERAL   Well 51 y.o. female. Counseled on diet and exercise. Other orders as above. Home stretches and exercises given for trapezius. Heat, Tylenol. Follow up in 6 mo for DM visit. The patient voiced understanding and agreement to the plan.  Lindsey Rocheicholas Paul Dal[place]Wendling, DO 02/23/17 12:01 PM

## 2017-02-23 NOTE — Patient Instructions (Addendum)
Miralax 1-2 times daily for 3 days. If you are not having any benefit, use an enema.  Trapezius Palsy [facility] Ask your health care provider which exercises are safe for you. Do exercises exactly as told by your health care provider and adjust them as directed. It is normal to feel mild stretching, pulling, tightness, or discomfort as you do these exercises, but you should stop right away if you feel sudden pain or your pain gets worse.Do not begin these exercises until told by your health care provider. Stretching and range of motion exercises These exercises warm up your muscles and joints and improve the movement and flexibility of your shoulder. These exercises can also help to relieve pain, numbness, and tingling. If you are unable to do any of the following for any reason, do not further attempt to do it.  Exercise A: Flexion, standing    1. Stand and hold a broomstick, a cane, or a similar object. Place your hands a little more than shoulder-width apart on the object. Your left / right hand should be palm-up, and your other hand should be palm-down. 2. Push the stick to raise your left / right arm out to your side and then over your head. Use your other hand to help move the stick. Stop when you feel a stretch in your shoulder, or when you reach the angle that is recommended by your health care provider. ? Avoid shrugging your shoulder while you raise your arm. Keep your shoulder blade tucked down toward your spine. 3. Hold for 10-15 seconds. 4. Slowly return to the starting position. Repeat 2-3 times. Complete this exercise 1 time a day.  Exercise B: Abduction, supine    1. Lie on your back and hold a broomstick, a cane, or a similar object. Place your hands a little more than shoulder-width apart on the object. Your left / right hand should be palm-up, and your other hand should be palm-down. 2. Push the stick to raise your left / right arm out to your side and then over your head. Use  your other hand to help move the stick. Stop when you feel a stretch in your shoulder, or when you reach the angle that is recommended by your health care provider. ? Avoid shrugging your shoulder while you raise your arm. Keep your shoulder blade tucked down toward your spine. 3. Hold for 10-15 seconds. 4. Slowly return to the starting position. Repeat 2-3 times. Complete this exercise 1 time  a day.  Exercise C: Flexion, active-assisted    1. Lie on your back. You may bend your knees for comfort. 2. Hold a broomstick, a cane, or a similar object. Place your hands about shoulder-width apart on the object. Your palms should face toward your feet. 3. Raise the stick and move your arms over your head and behind your head, toward the floor. Use your healthy arm to help your left / right arm move farther. Stop when you feel a gentle stretch in your shoulder, or when you reach the angle where your health care provider tells you to stop. 4. Hold for 10-15 seconds. 5. Slowly return to the starting position. Repeat 2-3 times. Complete this exercise 1 time  a day.  Exercise D: External rotation and abduction    1. Stand in a door frame with one of your feet slightly in front of the other. This is called a staggered stance. 2. Choose one of the following positions as told by your health care  provider: ? Place your hands and forearms on the door frame above your head. ? Place your hands and forearms on the door frame at the height of your head. ? Place your hands on the door frame at the height of your elbows. 3. Slowly move your weight onto your front foot until you feel a stretch across your chest and in the front of your shoulders. Keep your head and chest upright and keep your abdominal muscles tight. 4. Hold for 10-15 seconds. 5. To release the stretch, shift your weight to your back foot. Repeat 2-3 times. Complete this stretch 1 time  a day.  Strengthening exercises These exercises build  strength and endurance in your shoulder. Endurance is the ability to use your muscles for a long time, even after your muscles get tired. Exercise E: Scapular depression and adduction  1. Sit on a stable chair. Support your arms in front of you with pillows, armrests, or a tabletop. Keep your elbows in line with the sides of your body. 2. Gently move your shoulder blades down toward your middle back. Relax the muscles on the tops of your shoulders and in the back of your neck. 3. Hold for 10-15 seconds. 4. Slowly release the tension and relax your muscles completely before doing this exercise again. 5. After you have practiced this exercise, try doing the exercise without the arm support. Then, try the exercise while standing instead of sitting. Repeat 2-3 times. Complete this exercise 1 time  a day.  Exercise F: Shoulder abduction, isometric    1. Stand or sit about 4-6 inches (10-15 cm) from a wall with your left / right side facing the wall. 2. Bend your left / right elbow and gently press your elbow against the wall. 3. Increase the pressure slowly until you are pressing as hard as you can without shrugging your shoulder. 4. Hold for 10-15 seconds. 5. Slowly release the tension and relax your muscles completely. Repeat 2-3 times. Complete this exercise 1 time  a day.  Exercise G: Shoulder flexion, isometric    1. Stand or sit about 4-6 inches (10-15 cm) away from a wall with your left / right side facing the wall. 2. Keep your left / right elbow straight and gently press the top of your fist against the wall. Increase the pressure slowly until you are pressing as hard as you can without shrugging your shoulder. 3. Hold for 10-15 seconds. 4. Slowly release the tension and relax your muscles completely. Repeat 2-3 times. Complete this exercise 1 time  a day.  Exercise H: Internal rotation    1. Sit in a stable chair without armrests, or stand. Secure an exercise band at your left  / right side, at elbow height. 2. Place a soft object, such as a folded towel or a small pillow, under your left / right upper arm so your elbow is a few inches (about 8 cm) away from your side. 3. Hold the end of the exercise band so the band stretches. 4. Keeping your elbow pressed against the soft object under your arm, move your forearm across your body toward your abdomen. Keep your body steady so the movement is only coming from your shoulder. 5. Hold for 10-15 seconds. 6. Slowly return to the starting position. Repeat 2-3 times. Complete this exercise 1 time  a day.  Exercise I: External rotation    1. Sit in a stable chair without armrests, or stand. 2. Secure an exercise band at  your left / right side, at elbow height. 3. Place a soft object, such as a folded towel or a small pillow, under your left / right upper arm so your elbow is a few inches (about 8 cm) away from your side. 4. Hold the end of the exercise band so the band stretches. 5. Keeping your elbow pressed against the soft object under your arm, move your forearm out, away from your abdomen. Keep your body steady so the movement is only coming from your shoulder. 6. Hold for 10-15 seconds. 7. Slowly return to the starting position. Repeat 2-3 times. Complete this exercise 1 time  a day. Exercise J: Shoulder extension  1. Sit in a stable chair without armrests, or stand. Secure an exercise band to a stable object in front of you so the band is at shoulder height. 2. Hold one end of the exercise band in each hand. Your palms should face each other. 3. Straighten your elbows and lift your hands up to shoulder height. 4. Step back, away from the secured end of the exercise band, until the band stretches. 5. Squeeze your shoulder blades together and pull your hands down to the sides of your thighs. Stop when your hands are straight down by your sides. Do not let your hands go behind your body. 6. Hold for 10-15  seconds. 7. Slowly return to the starting position. Repeat 2-3 times. Complete this exercise 1 time  a day.  Exercise K: Shoulder extension, prone    1. Lie on your abdomen on a firm surface so your left / right arm hangs over the edge. 2. Hold a 5 lb weight in your hand so your palm faces in toward your body. Your arm should be straight. 3. Squeeze your shoulder blade down toward the middle of your back. 4. Slowly raise your arm behind you, up to the height of the surface that you are lying on. Keep your arm straight. 5. Hold for 10-15 seconds. 6. Slowly return to the starting position and relax your muscles. Repeat 2-3 times. Complete this exercise 1 time  a day.  Exercise L: Horizontal abduction, prone  1. Lie on your abdomen on a firm surface so your left / right arm hangs over the edge. 2. Hold a 5 lb weight in your hand so your palm faces toward your feet. Your arm should be straight. 3. Squeeze your shoulder blade down toward the middle of your back. 4. Bend your elbow so your hand moves up, until your elbow is bent to an "L" shape (90 degrees). With your elbow bent, slowly move your forearm forward and up. Raise your hand up to the height of the surface that you are lying on. ? Your upper arm should not move, and your elbow should stay bent. ? At the top of the movement, your palm should face the floor. 5. Hold for 10-15 seconds. 6. Slowly return to the starting position and relax your muscles. Repeat 2-3 times. Complete this exercise 1 time a day.  Exercise M: Horizontal abduction, standing  1. Sit on a stable chair, or stand. 2. Secure an exercise band to a stable object in front of you so the band is at shoulder height. 3. Hold one end of the exercise band in each hand. 4. Straighten your elbows and lift your hands straight in front of you, up to shoulder height. Your palms should face down, toward the floor. 5. Step back, away from the secured end of the  exercise band,  until the band stretches. 6. Move your arms out to your sides, and keep your arms straight. 7. Hold for 10-15 seconds. 8. Slowly return to the starting position. Repeat 2-3 times. Complete this exercise 1 time a day.  Exercise N: Scapular retraction and elevation  1. Sit on a stable chair, or stand. 2. Secure an exercise band to a stable object in front of you so the band is at shoulder height. 3. Hold one end of the exercise band in each hand. Your palms should face each other. 4. Sit in a stable chair without armrests, or stand. 5. Step back, away from the secured end of the exercise band, until the band stretches. 6. Squeeze your shoulder blades together and lift your hands over your head. Keep your elbows straight. 7. Hold for 10-15 seconds. 8. Slowly return to the starting position. Repeat 3-4 times. Complete this exercise 1-2 times a day.  This information is not intended to replace advice given to you by your health care provider. Make sure you discuss any questions you have with your health care provider. Document Released: 10/06/2005 Document Revised: 06/12/2016 Document Reviewed: 08/23/2015 Elsevier Interactive Patient Education  2017 [employer].

## 2017-02-23 NOTE — Addendum Note (Signed)
Addended by: Verdie ShireBAYNES, ANGELA M on: 02/23/2017 04:55 PM   Modules accepted: Orders

## 2017-02-23 NOTE — Progress Notes (Signed)
Pre visit review using our clinic review tool, if applicable. No additional management support is needed unless otherwise documented below in the visit note. 

## 2017-02-24 ENCOUNTER — Telehealth: Payer: Self-pay

## 2017-02-24 LAB — HEPATITIS C ANTIBODY: HCV Ab: NEGATIVE

## 2017-02-24 NOTE — Telephone Encounter (Signed)
PA initiated via Covermymeds; KEY: [id number]. Awaiting determination.

## 2017-02-25 MED ORDER — DULOXETINE HCL 30 MG PO CPEP: 30.0000 mg | ORAL_CAPSULE | Freq: Every day | ORAL | 0 refills | Status: DC

## 2017-03-02 ENCOUNTER — Ambulatory Visit (HOSPITAL_BASED_OUTPATIENT_CLINIC_OR_DEPARTMENT_OTHER)
Admission: RE | Admit: 2017-03-02 | Discharge: 2017-03-02 | Disposition: A | Discharge status: Home / Self Care | Payer: BLUE CROSS/BLUE SHIELD | Source: Ambulatory Visit | Attending: Family Medicine | Admitting: Family Medicine

## 2017-03-02 DIAGNOSIS — Z1231 Encounter for screening mammogram for malignant neoplasm of breast: Secondary | ICD-10-CM | POA: Diagnosis not present

## 2017-03-02 DIAGNOSIS — Z1239 Encounter for other screening for malignant neoplasm of breast: Secondary | ICD-10-CM

## 2017-03-02 IMAGING — MG DIGITAL SCREENING BILATERAL MAMMOGRAM WITH CAD
5 series · 5 of 5 positions shown · non-contrast
Comparison: None.

CLINICAL DATA: Screening.

EXAM:
DIGITAL SCREENING BILATERAL MAMMOGRAM WITH CAD

[L MLO]
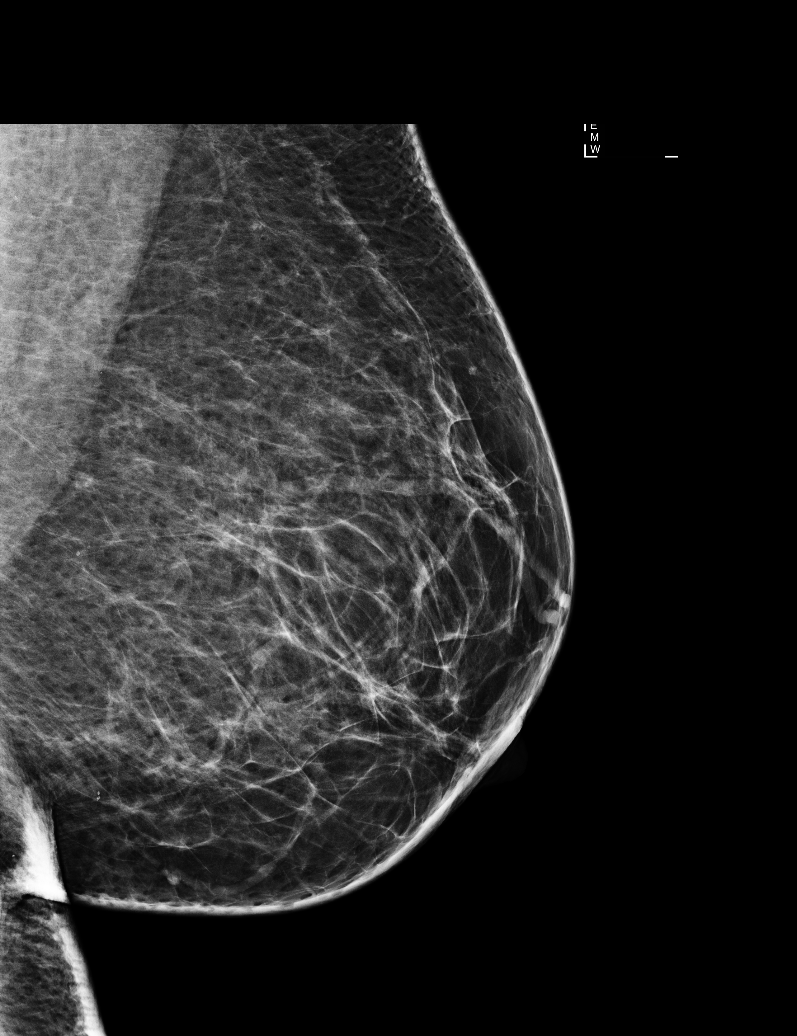

[L CV]
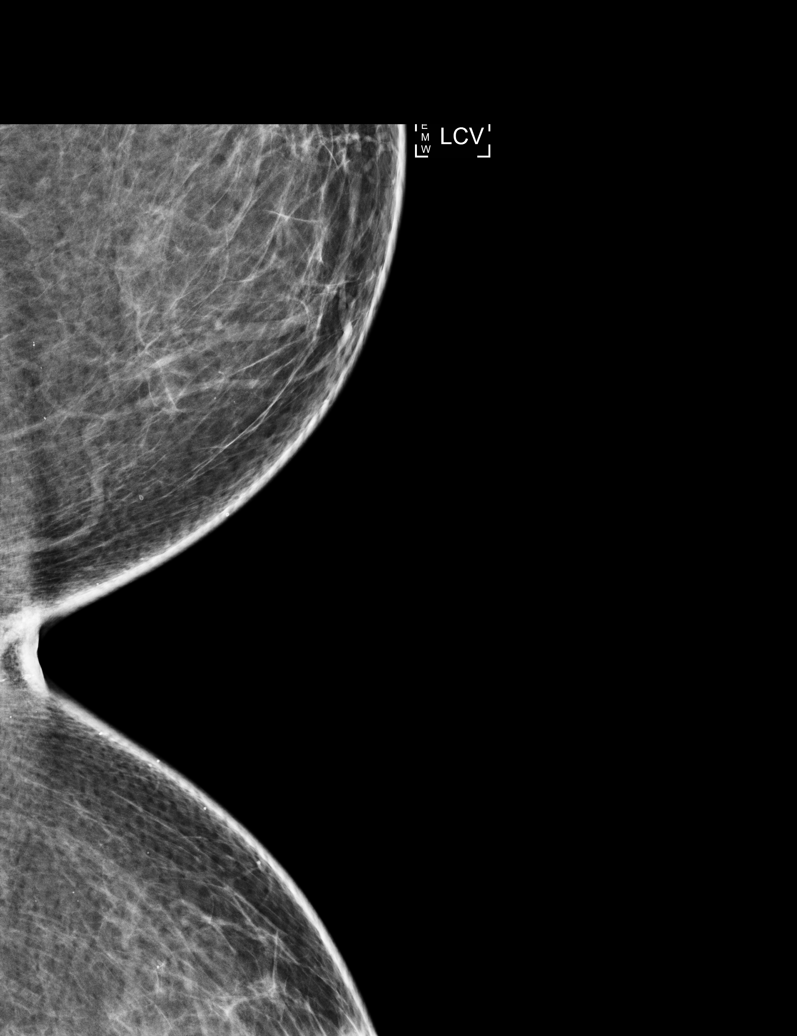

[L CC]
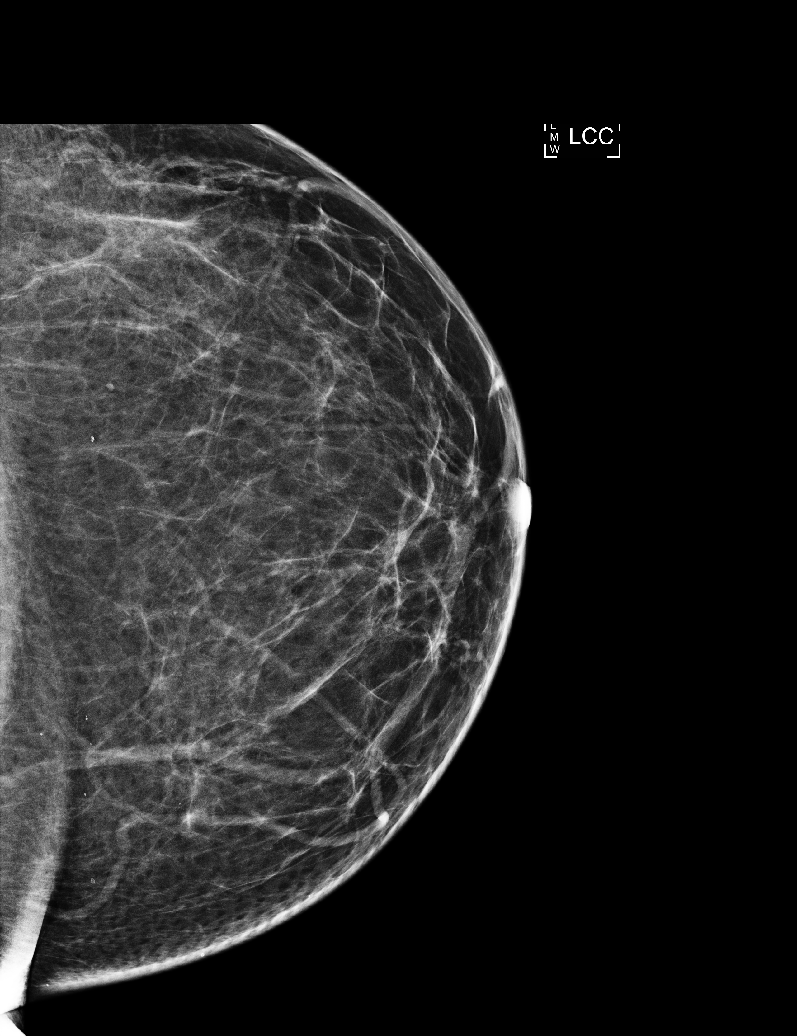

[R CC]
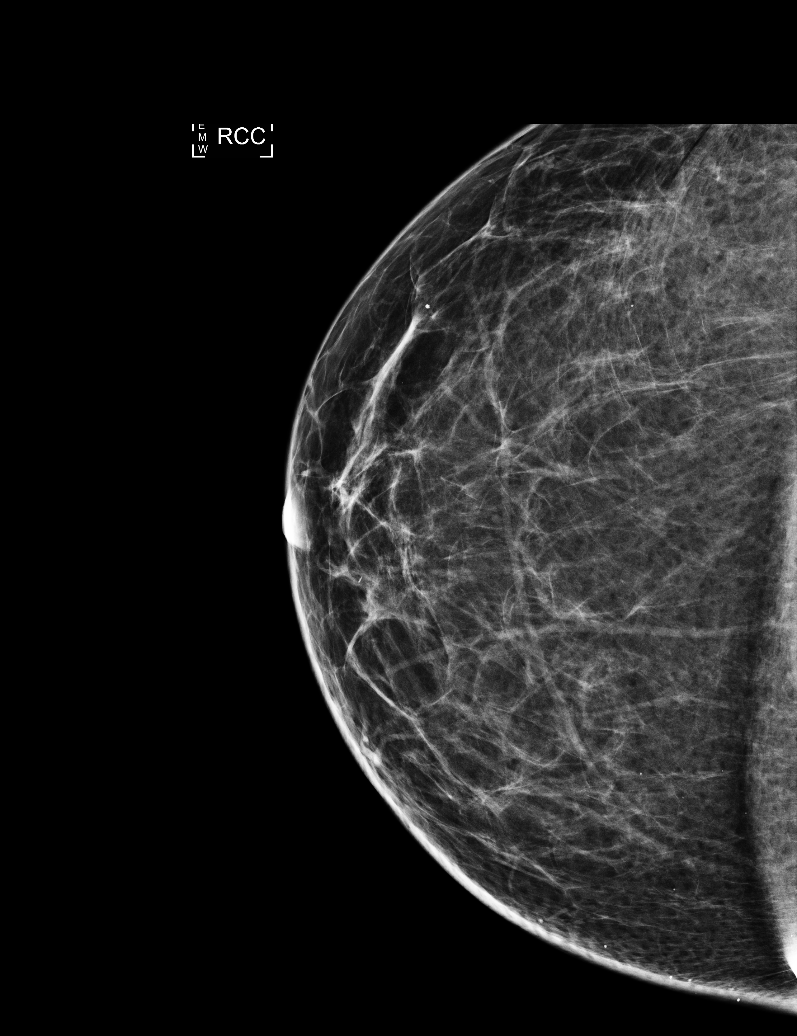

[R MLO]
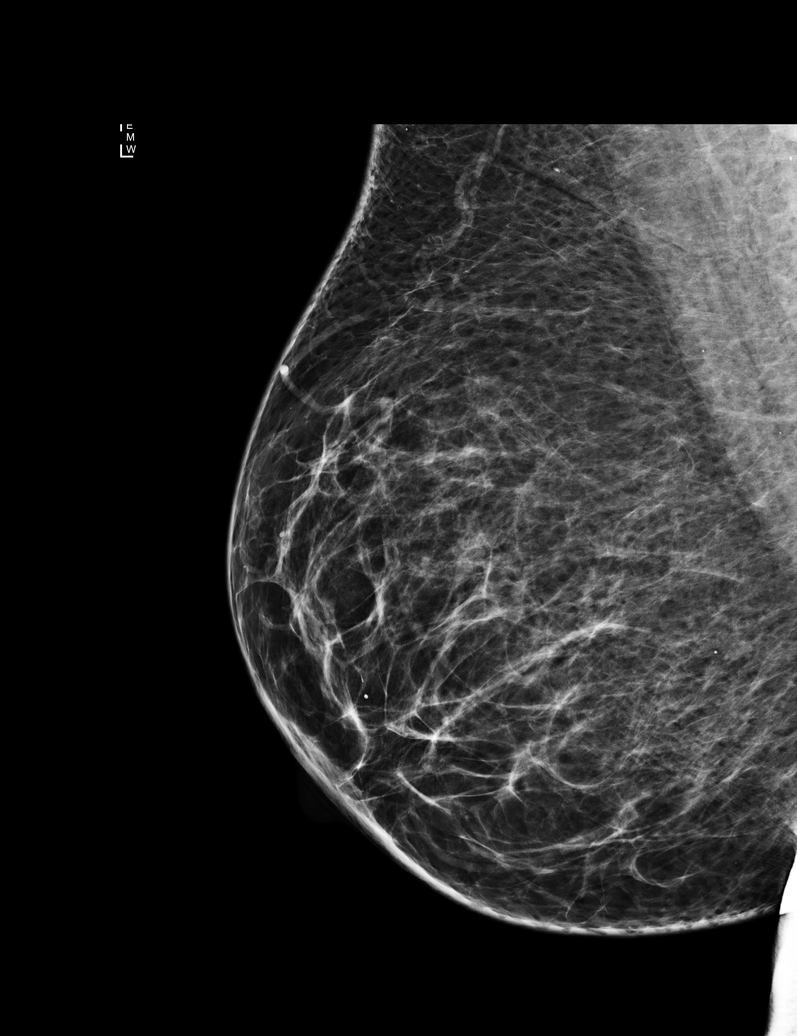

[5 of 5 positions shown; findings below may reference images not displayed]

ACR Breast Density Category b: There are scattered areas of
fibroglandular density.
FINDINGS: There are no findings suspicious for malignancy. Images were
processed with CAD.
IMPRESSION: No mammographic evidence of malignancy. A result letter of this
screening mammogram will be mailed directly to the patient.

RECOMMENDATION:
Screening mammogram in one year. (Code:[GD])

BI-RADS CATEGORY  1: Negative.

## 2017-03-06 NOTE — Telephone Encounter (Signed)
Received a letter of denial for the [employer]rica on (02/25/17).  The letter stated that restricted access medications may be covered when two alternative medications on the member's formulary have been tried and did not work.  In this case, only one of the alternative medications has been tried:gabapentin.  Alternative medications include (please refer to member's formulary): duloxetine.  Informed Dr. Carmelia RollerWendling of the denial letter and his recommendation was to have the pt to try the Duloxetine.  Called and spoke with the pt and informed her of the denial letter and Dr. Hollie BeachWendling's recommendation.  Pt verbalized understanding and agreed.   New prescription for the Duloxetine 30 mg was sent to the pharmacy by e-script.//AB/CMA

## 2017-07-14 ENCOUNTER — Telehealth: Payer: Self-pay | Admitting: Family Medicine

## 2017-07-14 NOTE — Telephone Encounter (Signed)
Pt called in to follow up on letter. Advised that pt PCP is out of the office.

## 2017-07-14 NOTE — Telephone Encounter (Signed)
Relation to BJ:YNWG Call back number:[phone]  D.O.D Dr. Laury Axon  Reason for call:  Patient requesting letter reflecting patient back concers stating she cant sit for a long period of time, requesting Dr. Note today due to jury duty tomorrow, please advise

## 2017-07-14 NOTE — Telephone Encounter (Addendum)
I'd have trouble ethically writing this letter as I have never evaluated her for this and was not under the impression she was unable to go to jury duty because of it. I see she has seen Neurosurgery in the past and was recommended PT. I don't see that she has gone to PT, but if she has and I just can't see it in her chart, I would recommend contacting either her physical therapist's office or her neurosurgeon. Otherwise, she would need an appointment with someone in our office. TY.

## 2017-07-15 NOTE — Telephone Encounter (Signed)
Called left message to call back 

## 2017-07-16 NOTE — Telephone Encounter (Signed)
Called the patient/no need to worry she has already completed Jury duty and is no need for request

## 2017-09-21 ENCOUNTER — Other Ambulatory Visit: Payer: Self-pay | Admitting: Family Medicine

## 2017-09-21 DIAGNOSIS — E119 Type 2 diabetes mellitus without complications: Secondary | ICD-10-CM

## 2017-09-21 NOTE — Telephone Encounter (Signed)
Copied from CRM [phone]. Topic: Quick Communication - Rx Refill/Question >> Sep 21, 2017  1:20 PM Crist InfanteHarrald, Kathy J wrote: Has the patient contacted their pharmacy? No she said they take too long levothyroxine (SYNTHROID, LEVOTHROID) 125 MCG tablet gabapentin (NEURONTIN) 300 MG capsule metFORMIN (GLUCOPHAGE) 500 MG tablet  90 days Preferred Pharmacy (with phone number or street name): [facility] [address] - [place], [place] - [address] [phone] (Phone) [phone] (Fax)     Agent: Please be advised that RX refills may take up to 48 hours. We ask that you follow-up with your pharmacy.

## 2017-09-23 MED ORDER — METFORMIN HCL 500 MG PO TABS: 500.0000 mg | ORAL_TABLET | Freq: Two times a day (BID) | ORAL | 0 refills | Status: DC

## 2017-09-23 MED ORDER — GABAPENTIN 300 MG PO CAPS: 300.0000 mg | ORAL_CAPSULE | Freq: Every day | ORAL | 0 refills | Status: DC

## 2017-09-23 MED ORDER — LEVOTHYROXINE SODIUM 125 MCG PO TABS: 125.0000 ug | ORAL_TABLET | Freq: Every day | ORAL | 0 refills | Status: DC

## 2017-09-23 NOTE — Telephone Encounter (Signed)
Pt notified medications requested were refilled.

## 2017-10-02 ENCOUNTER — Encounter: Payer: Self-pay | Admitting: Family Medicine

## 2017-10-02 ENCOUNTER — Ambulatory Visit (INDEPENDENT_AMBULATORY_CARE_PROVIDER_SITE_OTHER): Payer: BLUE CROSS/BLUE SHIELD | Admitting: Family Medicine

## 2017-10-02 VITALS — BP 122/78 | HR 71 | Temp 98.1°F | Ht 62.0 in | Wt 179.1 lb

## 2017-10-02 DIAGNOSIS — E119 Type 2 diabetes mellitus without complications: Secondary | ICD-10-CM

## 2017-10-02 DIAGNOSIS — R252 Cramp and spasm: Secondary | ICD-10-CM | POA: Diagnosis not present

## 2017-10-02 NOTE — Progress Notes (Signed)
Chief Complaint  Patient presents with  . Follow-up    leg cramps    Subjective: Patient is a 51 y.o. female here for leg cramps.  3 weeks, not as bad lately. Worse at night, sometimes will be when she walks.  Her oral intake of water is poor.  She denies any injury or change in activity.  She is on Prinzide.  She does not dedicate any time for exercise, but she does states she is physically active.  Her diet is poor.  For diabetes, she is on a low-dose of metformin twice daily.  She states over the past several weeks, her sugars have been in the 300s.  She claims to be losing weight.  Her last eye exam was 4 months ago.  She is up-to-date with her pneumonia vaccine.  She refuses a flu vaccine.  ROS: Heart: Denies chest pain  Lungs: Denies SOB   Family History  Problem Relation Age of Onset  . Kidney disease Mother        Kidney Failure  . Heart disease Father        MI   Past Medical History:  Diagnosis Date  . Diabetes mellitus without complication (HCC)   . Hypertension   . Thyroid disease    No Known Allergies  Current Outpatient Medications:  .  atorvastatin (LIPITOR) 40 MG tablet, Take 1 tablet (40 mg total) by mouth daily., Disp: 90 tablet, Rfl: 3 .  Calcium Carbonate-Vit D-Min (CALCIUM 1200 PO), Take by mouth., Disp: , Rfl:  .  ferrous sulfate 325 (65 FE) MG tablet, Take 1 tablet by mouth daily., Disp: , Rfl:  .  fluticasone (FLONASE) 50 MCG/ACT nasal spray, Place 2 sprays into both nostrils daily., Disp: 16 g, Rfl: 6 .  gabapentin (NEURONTIN) 300 MG capsule, Take 1 capsule (300 mg total) by mouth at bedtime., Disp: 90 capsule, Rfl: 0 .  levothyroxine (SYNTHROID, LEVOTHROID) 125 MCG tablet, Take 1 tablet (125 mcg total) by mouth daily before breakfast., Disp: 90 tablet, Rfl: 0 .  lisinopril-hydrochlorothiazide (PRINZIDE,ZESTORETIC) 10-12.5 MG tablet, Take 1 tablet by mouth daily., Disp: 90 tablet, Rfl: 1 .  metFORMIN (GLUCOPHAGE) 500 MG tablet, Take 1 tablet (500 mg  total) by mouth 2 (two) times daily with a meal., Disp: 180 tablet, Rfl: 0 .  Omega-3 Fatty Acids (FISH OIL) 1000 MG CAPS, Take by mouth., Disp: , Rfl:   Objective: BP 122/78 (BP Location: Left Arm, Patient Position: Sitting, Cuff Size: Normal)   Pulse 71   Temp 98.1 F (36.7 C) (Oral)   Ht 5\' 2"  (1.575 m)   Wt 179 lb 2 oz (81.3 kg)   SpO2 97%   BMI 32.76 kg/m  General: Awake, appears stated age HEENT: MMM, EOMi Heart: RRR, no bruits, no LE edema Lungs: CTAB, no rales, wheezes or rhonchi. No accessory muscle use MSK: +TTP over LE msc b/l, no edema, gait normal Psych: Age appropriate judgment and insight, normal affect and mood  Assessment and Plan: Leg cramp - Plan: Magnesium  Controlled type 2 diabetes mellitus without complication, without long-term current use of insulin (HCC) - Plan: Hemoglobin A1c, Microalbumin / creatinine urine ratio, Lipid panel, CBC, Comprehensive metabolic panel  Orders as above. Check labs. Encouraged adequate hydration. Discussed using pickle juice to help with cramping if labs wnl and hydration is fruitless. F/u in 3 mo pending above. The patient voiced understanding and agreement to the plan.  Jilda Rocheicholas Paul [place], DO 10/02/17  4:45 PM

## 2017-10-02 NOTE — Patient Instructions (Signed)
Aim to do some physical exertion for 150 minutes per week. This is typically divided into 5 days per week, 30 minutes per day. The activity should be enough to get your heart rate up. Anything is better than nothing if you have time constraints.  Keep the diet clean.  Get a flu shot.

## 2017-10-02 NOTE — Progress Notes (Signed)
Pre visit review using our clinic review tool, if applicable. No additional management support is needed unless otherwise documented below in the visit note. 

## 2017-10-03 LAB — CBC
HCT: 46.3 % — ABNORMAL HIGH (ref 35.0–45.0)
Hemoglobin: 15 g/dL (ref 11.7–15.5)
MCH: 28.6 pg (ref 27.0–33.0)
MCHC: 32.4 g/dL (ref 32.0–36.0)
MCV: 88.2 fL (ref 80.0–100.0)
MPV: 10.8 fL (ref 7.5–12.5)
Platelets: 200 10*3/uL (ref 140–400)
RBC: 5.25 10*6/uL — ABNORMAL HIGH (ref 3.80–5.10)
RDW: 12.3 % (ref 11.0–15.0)
WBC: 7.7 10*3/uL (ref 3.8–10.8)

## 2017-10-03 LAB — COMPREHENSIVE METABOLIC PANEL
AG Ratio: 1.2 (calc) (ref 1.0–2.5)
ALT: 61 U/L — ABNORMAL HIGH (ref 6–29)
AST: 51 U/L — ABNORMAL HIGH (ref 10–35)
Albumin: 4.1 g/dL (ref 3.6–5.1)
Alkaline phosphatase (APISO): 81 U/L (ref 33–130)
BUN: 12 mg/dL (ref 7–25)
CO2: 31 mmol/L (ref 20–32)
Calcium: 9.9 mg/dL (ref 8.6–10.4)
Chloride: 96 mmol/L — ABNORMAL LOW (ref 98–110)
Creat: 0.8 mg/dL (ref 0.50–1.05)
Globulin: 3.3 g/dL (calc) (ref 1.9–3.7)
Glucose, Bld: 376 mg/dL — ABNORMAL HIGH (ref 65–99)
Potassium: 5.2 mmol/L (ref 3.5–5.3)
Sodium: 133 mmol/L — ABNORMAL LOW (ref 135–146)
Total Bilirubin: 0.6 mg/dL (ref 0.2–1.2)
Total Protein: 7.4 g/dL (ref 6.1–8.1)

## 2017-10-03 LAB — LIPID PANEL
Cholesterol: 159 mg/dL (ref <200)
HDL: 53 mg/dL (ref >50)
LDL Cholesterol (Calc): 77 mg/dL (calc)
Non-HDL Cholesterol (Calc): 106 mg/dL (calc) (ref <130)
Total CHOL/HDL Ratio: 3 (calc) (ref <5.0)
Triglycerides: 192 mg/dL — ABNORMAL HIGH (ref <150)

## 2017-10-03 LAB — HEMOGLOBIN A1C
Hgb A1c MFr Bld: 10.7 % of total Hgb — ABNORMAL HIGH (ref <5.7)
Mean Plasma Glucose: 260 (calc)
eAG (mmol/L): 14.4 (calc)

## 2017-10-03 LAB — MICROALBUMIN / CREATININE URINE RATIO
Creatinine, Urine: 39 mg/dL (ref 20–275)
Microalb Creat Ratio: 5 mcg/mg creat (ref <30)
Microalb, Ur: 0.2 mg/dL

## 2017-10-03 LAB — MAGNESIUM: Magnesium: 1.6 mg/dL (ref 1.5–2.5)

## 2017-10-05 ENCOUNTER — Other Ambulatory Visit: Payer: Self-pay | Admitting: Family Medicine

## 2017-10-05 ENCOUNTER — Other Ambulatory Visit: Payer: Self-pay | Admitting: *Deleted

## 2017-10-05 DIAGNOSIS — E119 Type 2 diabetes mellitus without complications: Secondary | ICD-10-CM

## 2017-10-05 MED ORDER — METFORMIN HCL 500 MG PO TABS: 500.0000 mg | ORAL_TABLET | Freq: Two times a day (BID) | ORAL | 0 refills | Status: DC

## 2017-10-05 NOTE — Telephone Encounter (Signed)
Pt has agreed to take 2 tabs of 500 mg of Metformin bid with meals.

## 2017-10-05 NOTE — Telephone Encounter (Signed)
-----   Message from Pincus Sanesobin B Ewing, CMA sent at 10/05/2017  8:47 AM EST ----- Called left message to call back.

## 2017-10-05 NOTE — Telephone Encounter (Signed)
-----   Message from Robin B Ewing, CMA sent at 10/05/2017  8:47 AM EST ----- Called left message to call back. 

## 2017-10-23 ENCOUNTER — Encounter: Payer: Self-pay | Admitting: Family Medicine

## 2017-10-23 ENCOUNTER — Ambulatory Visit: Payer: BLUE CROSS/BLUE SHIELD | Admitting: Family Medicine

## 2017-10-23 ENCOUNTER — Ambulatory Visit (INDEPENDENT_AMBULATORY_CARE_PROVIDER_SITE_OTHER): Payer: BLUE CROSS/BLUE SHIELD | Admitting: Family Medicine

## 2017-10-23 VITALS — BP 140/86 | HR 78 | Temp 98.3°F | Ht 61.0 in | Wt 178.5 lb

## 2017-10-23 DIAGNOSIS — I10 Essential (primary) hypertension: Secondary | ICD-10-CM | POA: Diagnosis not present

## 2017-10-23 DIAGNOSIS — E1165 Type 2 diabetes mellitus with hyperglycemia: Secondary | ICD-10-CM

## 2017-10-23 DIAGNOSIS — J029 Acute pharyngitis, unspecified: Secondary | ICD-10-CM | POA: Diagnosis not present

## 2017-10-23 DIAGNOSIS — H6983 Other specified disorders of Eustachian tube, bilateral: Secondary | ICD-10-CM

## 2017-10-23 MED ORDER — FLUTICASONE PROPIONATE 50 MCG/ACT NA SUSP: 2.0000 | Freq: Every day | NASAL | 2 refills | Status: DC

## 2017-10-23 MED ORDER — LISINOPRIL-HYDROCHLOROTHIAZIDE 20-12.5 MG PO TABS: 1.0000 | ORAL_TABLET | Freq: Every day | ORAL | 2 refills | Status: DC

## 2017-10-23 MED ORDER — CANAGLIFLOZIN 100 MG PO TABS: 100.0000 mg | ORAL_TABLET | Freq: Every day | ORAL | 2 refills | Status: DC

## 2017-10-23 NOTE — Progress Notes (Signed)
Chief Complaint  Patient presents with  . Results    lab results     Breslin Burklow here for URI complaints.  Duration: 3 days  Associated symptoms: sinus congestion, ear fullness, sore throat and cough Denies: sinus pain, rhinorrhea, itchy watery eyes, ear pain, ear drainage, shortness of breath, myalgia and fevers/rigors Treatment to date: none Sick contacts: Yes- co workers  DM-she had previously been well controlled with metformin twice daily, however recently was found to have an A1c of 7.7.  Form increased to 1000 mg twice daily and she was instructed to follow-up.  Sugars have been decreased to the around the low 300s.  She got a bicycle for Christmas and has been cycling.  Her diet is overall healthy.  Hypertension Patient presents for hypertension follow up. She does monitor home blood pressures. Blood pressures ranging on average from 140's/80-90's. She is compliant with medications-Prinzide 10-12.5 mg daily. Patient has these side effects of medication: none She is adhering to a healthy diet overall. Exercise: cycling  ROS:  Const: Denies fevers HEENT: As noted in HPI Lungs: No SOB Heart: No CP  Past Medical History:  Diagnosis Date  . Diabetes mellitus without complication (HCC)   . Hypertension   . Thyroid disease    Family History  Problem Relation Age of Onset  . Kidney disease Mother        Kidney Failure  . Heart disease Father        MI   No Known Allergies  Current Outpatient Medications on File Prior to Visit  Medication Sig Dispense Refill  . atorvastatin (LIPITOR) 40 MG tablet Take 1 tablet (40 mg total) by mouth daily. 90 tablet 3  . Calcium Carbonate-Vit D-Min (CALCIUM 1200 PO) Take by mouth.    . ferrous sulfate 325 (65 FE) MG tablet Take 1 tablet by mouth daily.    Marland Kitchen gabapentin (NEURONTIN) 300 MG capsule Take 1 capsule (300 mg total) by mouth at bedtime. 90 capsule 0  . levothyroxine (SYNTHROID, LEVOTHROID) 125 MCG tablet Take 1 tablet  (125 mcg total) by mouth daily before breakfast. 90 tablet 0  . metFORMIN (GLUCOPHAGE) 500 MG tablet Take 1 tablet (500 mg total) by mouth 2 (two) times daily with a meal. 180 tablet 0  . Omega-3 Fatty Acids (FISH OIL) 1000 MG CAPS Take by mouth.     BP 140/86 (BP Location: Left Arm, Patient Position: Sitting, Cuff Size: Normal)   Pulse 78   Temp 98.3 F (36.8 C) (Oral)   Ht 5\' 1"  (1.549 m)   Wt 178 lb 8 oz (81 kg)   SpO2 98%   BMI 33.73 kg/m  General: Awake, alert, appears stated age HEENT: AT, Page, ears patent b/l and TM's neg, nares patent w/o discharge, pharynx pink and without exudates, MMM Neck: No masses or asymmetry Heart: RRR, no LE edema Lungs: CTAB, no accessory muscle use Psych: Age appropriate judgment and insight, normal mood and affect  Dysfunction of both eustachian tubes - Plan: fluticasone (FLONASE) 50 MCG/ACT nasal spray  Viral pharyngitis  Type 2 diabetes mellitus with hyperglycemia, without long-term current use of insulin (HCC) - Plan: canagliflozin (INVOKANA) 100 MG TABS tablet  Essential hypertension - Plan: lisinopril-hydrochlorothiazide (PRINZIDE,ZESTORETIC) 20-12.5 MG tablet  Orders as above. Increase dose of Prinzide to 20-12.5 from 10-12.5 mg/d. Counseled on diet and exercise. Add Invokana. 0/4 Centor criteria.  Anti-inflammatories. Continue to push fluids, practice good hand hygiene, cover mouth when coughing. If starting to experience fevers, shaking, or  shortness of breath, seek immediate care. Follow-up in 5 weeks to recheck blood pressure and sugars. Pt voiced understanding and agreement to the plan.  Jilda Rocheicholas Paul ProvencalWendling, DO 10/23/17 4:31 PM

## 2017-10-23 NOTE — Progress Notes (Signed)
Pre visit review using our clinic review tool, if applicable. No additional management support is needed unless otherwise documented below in the visit note. 

## 2017-10-23 NOTE — Patient Instructions (Addendum)
Keep checking sugars.  Ibuprofen 400-600 mg (2-3 over the counter strength tabs) every 6 hours as needed for pain.  OK to take Tylenol 1000 mg (2 extra strength tabs) or 975 mg (3 regular strength tabs) every 6 hours as needed.  Flonase (fluticasone); nasal spray that is over the counter. 2 sprays each nostril, once daily. Aim towards the same side eye when you spray.  There are available OTC, and the generic versions, which may be cheaper, are in parentheses. Show this to a pharmacist if you have trouble finding any of these items.  Aim to do some physical exertion for 150 minutes per week. This is typically divided into 5 days per week, 30 minutes per day. The activity should be enough to get your heart rate up. Anything is better than nothing if you have time constraints.  Keep the diet clean.   Let us know if you need anything.

## 2017-10-29 ENCOUNTER — Telehealth: Payer: Self-pay | Admitting: Family Medicine

## 2017-10-29 NOTE — Telephone Encounter (Signed)
Copied from CRM [id number]. Topic: Quick Communication - See Telephone Encounter >> Oct 29, 2017  3:36 PM Cipriano BunkerLambe, Annette S wrote: CRM for notification. See Telephone encounter for:   Medication for sugar prescribed was too expensive and did not get. Asking if can call in different medication.   [facility] [address] - [place], [place] - [address] [place] [address] Phone: [phone] Fax: [phone]    10/29/17.

## 2017-10-30 MED ORDER — PIOGLITAZONE HCL 30 MG PO TABS: 30.0000 mg | ORAL_TABLET | Freq: Every day | ORAL | 2 refills | Status: DC

## 2017-10-30 NOTE — Telephone Encounter (Signed)
New med called in. Don't fill the pricey one. TY.

## 2017-10-30 NOTE — Telephone Encounter (Signed)
Called the patient left a detailed message of PCP's response and informed to call back.

## 2017-10-30 NOTE — Telephone Encounter (Signed)
See encounter. Last office visit 10/23/17. Thanks.

## 2017-11-27 ENCOUNTER — Ambulatory Visit: Payer: BLUE CROSS/BLUE SHIELD | Admitting: Family Medicine

## 2017-12-04 ENCOUNTER — Encounter (INDEPENDENT_AMBULATORY_CARE_PROVIDER_SITE_OTHER): Payer: Self-pay | Admitting: Family

## 2017-12-04 ENCOUNTER — Ambulatory Visit (INDEPENDENT_AMBULATORY_CARE_PROVIDER_SITE_OTHER): Payer: BLUE CROSS/BLUE SHIELD | Admitting: Family

## 2017-12-04 VITALS — Ht 61.0 in | Wt 178.5 lb

## 2017-12-04 DIAGNOSIS — M5416 Radiculopathy, lumbar region: Secondary | ICD-10-CM | POA: Diagnosis not present

## 2017-12-04 MED ORDER — PREDNISONE 50 MG PO TABS: ORAL_TABLET | ORAL | 0 refills | Status: DC

## 2017-12-04 NOTE — Progress Notes (Signed)
Office Visit Note   Patient: Lindsey Owen           Date of Birth: 08/07/1966           MRN: [id number] Visit Date: 12/04/2017              Requested by: Sharlene DoryWendling, Nicholas Paul, DO [address] [place], [place] [address] PCP: Sharlene DoryWendling, Nicholas Paul, DO  Chief Complaint  Patient presents with  . Right Knee - Pain      HPI: The patient is a [age] year old woman seen today for evaluation of RLE pain. Has been getting worse over last weeks to months. Has occurred of and on over last 10-12 years. Had a back injury in 2007.  Throbbing pain in knee, mainly laterally. Squeezing radiating pain from thigh down to foot. Some numbness and tingling in foot. Heaviness with ambulation of RLE. Intermittent knee swelling and popping. No mechanical symptoms. No red flag symptoms.   Last had ESI of lumbar spine in 2012 which worked well, states provided 2 years of relief. Is open to repeat injection.   No recent injury.   Has tried increasing her Gabapentin, using Celebrex and using Ibuprofen 600 mg without relief. Has also had several chiropractic treatments.   Assessment & Plan: Visit Diagnoses: No diagnosis found.  Plan: Prednisone sent to pharmacy. [facility] got [facility]. May need to update her lumbar spine MRI. At this point patient not interested in surgical intervention for spine.   Follow-Up Instructions: No Follow-up on file.   Back Exam   Tenderness  The patient is experiencing tenderness in the lumbar and sacroiliac.  Muscle Strength  The patient has normal back strength.  Tests  Straight leg raise right: positive Straight leg raise left: positive      Patient is alert, oriented, no adenopathy, well-dressed, normal affect, normal respiratory effort.   Imaging: No results found. No images are attached to the encounter.  Labs: Lab Results  Component Value Date   HGBA1C 10.7 (H) 10/02/2017   HGBA1C 6.7 (H) 02/09/2017    HGBA1C 6.7 (H) 02/04/2016   ESRSEDRATE 18 11/23/2015    @LABSALLVALUES (HGBA1)@  Body mass index is 33.73 kg/m.  Orders:  No orders of the defined types were placed in this encounter.  Meds ordered this encounter  Medications  . predniSONE (DELTASONE) 50 MG tablet    Sig: Take one tablet by mouth daily for 5 days.    Dispense:  5 tablet    Refill:  0     Procedures: No procedures performed  Clinical Data: No additional findings.  ROS:  All other systems negative, except as noted in the HPI. Review of Systems  Constitutional: Negative for chills and fever.  Musculoskeletal: Positive for back pain and myalgias.  Neurological: Positive for weakness and numbness.    Objective: Vital Signs: Ht 5\' 1"  (1.549 m)   Wt 178 lb 8 oz (81 kg)   BMI 33.73 kg/m   Specialty Comments:  No specialty comments available.  PMFS History: Patient Active Problem List   Diagnosis Date Noted  . Joint pain 11/26/2015  . Obesity 05/21/2015  . Essential hypertension 03/19/2015  . Type 2 diabetes mellitus with hyperglycemia, without long-term current use of insulin (HCC) 03/19/2015  . Other specified hypothyroidism 03/19/2015   Past Medical History:  Diagnosis Date  . Diabetes mellitus without complication (HCC)   . Hypertension   . Thyroid disease  Family History  Problem Relation Age of Onset  . Kidney disease Mother        Kidney Failure  . Heart disease Father        MI    Past Surgical History:  Procedure Laterality Date  . CESAREAN SECTION  2000  . UTERINE FIBROID SURGERY     Social History   Occupational History  . Not on file  Tobacco Use  . Smoking status: Never Smoker  . Smokeless tobacco: Never Used  Substance and Sexual Activity  . Alcohol use: Not on file  . Drug use: No  . Sexual activity: Not on file

## 2017-12-07 ENCOUNTER — Telehealth (INDEPENDENT_AMBULATORY_CARE_PROVIDER_SITE_OTHER): Payer: Self-pay | Admitting: *Deleted

## 2017-12-08 ENCOUNTER — Other Ambulatory Visit (INDEPENDENT_AMBULATORY_CARE_PROVIDER_SITE_OTHER): Payer: Self-pay | Admitting: Physical Medicine and Rehabilitation

## 2017-12-08 MED ORDER — DIAZEPAM 5 MG PO TABS: ORAL_TABLET | ORAL | 0 refills | Status: DC

## 2017-12-08 NOTE — Telephone Encounter (Signed)
Printed rx, please fax

## 2017-12-21 ENCOUNTER — Encounter (INDEPENDENT_AMBULATORY_CARE_PROVIDER_SITE_OTHER): Payer: Self-pay | Admitting: Physical Medicine and Rehabilitation

## 2017-12-24 ENCOUNTER — Encounter (INDEPENDENT_AMBULATORY_CARE_PROVIDER_SITE_OTHER): Payer: Self-pay | Admitting: Physical Medicine and Rehabilitation

## 2017-12-28 ENCOUNTER — Encounter: Payer: Self-pay | Admitting: Family Medicine

## 2017-12-28 ENCOUNTER — Ambulatory Visit (INDEPENDENT_AMBULATORY_CARE_PROVIDER_SITE_OTHER): Payer: BLUE CROSS/BLUE SHIELD | Admitting: Family Medicine

## 2017-12-28 VITALS — BP 122/80 | HR 77 | Temp 98.1°F | Ht 61.0 in | Wt 180.2 lb

## 2017-12-28 DIAGNOSIS — S46812A Strain of other muscles, fascia and tendons at shoulder and upper arm level, left arm, initial encounter: Secondary | ICD-10-CM | POA: Diagnosis not present

## 2017-12-28 MED ORDER — METHYLPREDNISOLONE ACETATE 80 MG/ML IJ SUSP
80.0000 mg | Freq: Once | INTRAMUSCULAR | Status: AC
  Administered 2017-12-28: 80 mg via INTRAMUSCULAR

## 2017-12-28 MED ORDER — VENLAFAXINE HCL ER 37.5 MG PO CP24: 37.5000 mg | ORAL_CAPSULE | Freq: Every day | ORAL | 2 refills | Status: DC

## 2017-12-28 MED ORDER — ACETAMINOPHEN-CODEINE #3 300-30 MG PO TABS
1.0000 | ORAL_TABLET | Freq: Three times a day (TID) | ORAL | 0 refills | Status: DC | PRN
Start: 2017-12-28 — End: 2018-01-13

## 2017-12-28 NOTE — Addendum Note (Signed)
Addended by: Scharlene GlossEWING, Tima Curet B on: 12/28/2017 09:58 AM   Modules accepted: Orders

## 2017-12-28 NOTE — Patient Instructions (Signed)
Heat (pad or rice pillow in microwave) over affected area, 10-15 minutes twice daily.   Ibuprofen 400-600 mg (2-3 over the counter strength tabs) every 6 hours as needed for pain.  Trapezius stretches/exercises Do exercises exactly as told by your health care provider and adjust them as directed. It is normal to feel mild stretching, pulling, tightness, or discomfort as you do these exercises, but you should stop right away if you feel sudden pain or your pain gets worse.  Stretching and range of motion exercises These exercises warm up your muscles and joints and improve the movement and flexibility of your shoulder. These exercises can also help to relieve pain, numbness, and tingling. If you are unable to do any of the following for any reason, do not further attempt to do it.   Exercise A: Flexion, standing    1. Stand and hold a broomstick, a cane, or a similar object. Place your hands a little more than shoulder-width apart on the object. Your left / right hand should be palm-up, and your other hand should be palm-down. 2. Push the stick to raise your left / right arm out to your side and then over your head. Use your other hand to help move the stick. Stop when you feel a stretch in your shoulder, or when you reach the angle that is recommended by your health care provider. ? Avoid shrugging your shoulder while you raise your arm. Keep your shoulder blade tucked down toward your spine. 3. Hold for 30 seconds. 4. Slowly return to the starting position. Repeat 2 times. Complete this exercise 3 times per week.  Exercise B: Abduction, supine    1. Lie on your back and hold a broomstick, a cane, or a similar object. Place your hands a little more than shoulder-width apart on the object. Your left / right hand should be palm-up, and your other hand should be palm-down. 2. Push the stick to raise your left / right arm out to your side and then over your head. Use your other hand to help  move the stick. Stop when you feel a stretch in your shoulder, or when you reach the angle that is recommended by your health care provider. ? Avoid shrugging your shoulder while you raise your arm. Keep your shoulder blade tucked down toward your spine. 3. Hold for 30 seconds. 4. Slowly return to the starting position. Repeat 2 times. Complete this exercise 3 times per week.  Exercise C: Flexion, active-assisted    1. Lie on your back. You may bend your knees for comfort. 2. Hold a broomstick, a cane, or a similar object. Place your hands about shoulder-width apart on the object. Your palms should face toward your feet. 3. Raise the stick and move your arms over your head and behind your head, toward the floor. Use your healthy arm to help your left / right arm move farther. Stop when you feel a gentle stretch in your shoulder, or when you reach the angle where your health care provider tells you to stop. 4. Hold for 30 seconds. 5. Slowly return to the starting position. Repeat 2 times. Complete this exercise 3 times per week.  Exercise D: External rotation and abduction    1. Stand in a door frame with one of your feet slightly in front of the other. This is called a staggered stance. 2. Choose one of the following positions as told by your health care provider: ? Place your hands and forearms on  the door frame above your head. ? Place your hands and forearms on the door frame at the height of your head. ? Place your hands on the door frame at the height of your elbows. 3. Slowly move your weight onto your front foot until you feel a stretch across your chest and in the front of your shoulders. Keep your head and chest upright and keep your abdominal muscles tight. 4. Hold for 30 seconds. 5. To release the stretch, shift your weight to your back foot. Repeat 2 times. Complete this stretch 3 times per week.  Strengthening exercises These exercises build strength and endurance in your  shoulder. Endurance is the ability to use your muscles for a long time, even after your muscles get tired. Exercise E: Scapular depression and adduction  1. Sit on a stable chair. Support your arms in front of you with pillows, armrests, or a tabletop. Keep your elbows in line with the sides of your body. 2. Gently move your shoulder blades down toward your middle back. Relax the muscles on the tops of your shoulders and in the back of your neck. 3. Hold for 3 seconds. 4. Slowly release the tension and relax your muscles completely before doing this exercise again. Repeat for a total of 10 repetitions. 5. After you have practiced this exercise, try doing the exercise without the arm support. Then, try the exercise while standing instead of sitting. Repeat 2 times. Complete this exercise 3 times per week.  Exercise F: Shoulder abduction, isometric    1. Stand or sit about 4-6 inches (10-15 cm) from a wall with your left / right side facing the wall. 2. Bend your left / right elbow and gently press your elbow against the wall. 3. Increase the pressure slowly until you are pressing as hard as you can without shrugging your shoulder. 4. Hold for 3 seconds. 5. Slowly release the tension and relax your muscles completely. Repeat for a total of 10 repetitions. Repeat 2 times. Complete this exercise 3 times per week.  Exercise G: Shoulder flexion, isometric    1. Stand or sit about 4-6 inches (10-15 cm) away from a wall with your left / right side facing the wall. 2. Keep your left / right elbow straight and gently press the top of your fist against the wall. Increase the pressure slowly until you are pressing as hard as you can without shrugging your shoulder. 3. Hold for 10-15 seconds. 4. Slowly release the tension and relax your muscles completely. Repeat for a total of 10 repetitions. Repeat 2 times. Complete this exercise 3 times per week.  Exercise H: Internal rotation    1. Sit in a  stable chair without armrests, or stand. Secure an exercise band at your left / right side, at elbow height. 2. Place a soft object, such as a folded towel or a small pillow, under your left / right upper arm so your elbow is a few inches (about 8 cm) away from your side. 3. Hold the end of the exercise band so the band stretches. 4. Keeping your elbow pressed against the soft object under your arm, move your forearm across your body toward your abdomen. Keep your body steady so the movement is only coming from your shoulder. 5. Hold for 3 seconds. 6. Slowly return to the starting position. Repeat for a total of 10 repetitions. Repeat 2 times. Complete this exercise 3 times per week.  Exercise I: External rotation    1.  Sit in a stable chair without armrests, or stand. 2. Secure an exercise band at your left / right side, at elbow height. 3. Place a soft object, such as a folded towel or a small pillow, under your left / right upper arm so your elbow is a few inches (about 8 cm) away from your side. 4. Hold the end of the exercise band so the band stretches. 5. Keeping your elbow pressed against the soft object under your arm, move your forearm out, away from your abdomen. Keep your body steady so the movement is only coming from your shoulder. 6. Hold for 3 seconds. 7. Slowly return to the starting position. Repeat for a total of 10 repetitions. Repeat 2 times. Complete this exercise 3 times per week. Exercise J: Shoulder extension  1. Sit in a stable chair without armrests, or stand. Secure an exercise band to a stable object in front of you so the band is at shoulder height. 2. Hold one end of the exercise band in each hand. Your palms should face each other. 3. Straighten your elbows and lift your hands up to shoulder height. 4. Step back, away from the secured end of the exercise band, until the band stretches. 5. Squeeze your shoulder blades together and pull your hands down to the  sides of your thighs. Stop when your hands are straight down by your sides. Do not let your hands go behind your body. 6. Hold for 3 seconds. 7. Slowly return to the starting position. Repeat for a total of 10 repetitions. Repeat 2 times. Complete this exercise 3 times per week.  Exercise K: Shoulder extension, prone    1. Lie on your abdomen on a firm surface so your left / right arm hangs over the edge. 2. Hold a 5 lb weight in your hand so your palm faces in toward your body. Your arm should be straight. 3. Squeeze your shoulder blade down toward the middle of your back. 4. Slowly raise your arm behind you, up to the height of the surface that you are lying on. Keep your arm straight. 5. Hold for 3 seconds. 6. Slowly return to the starting position and relax your muscles. Repeat for a total of 10 repetitions. Repeat 2 times. Complete this exercise 3 times per week.   Exercise L: Horizontal abduction, prone  1. Lie on your abdomen on a firm surface so your left / right arm hangs over the edge. 2. Hold a 5 lb weight in your hand so your palm faces toward your feet. Your arm should be straight. 3. Squeeze your shoulder blade down toward the middle of your back. 4. Bend your elbow so your hand moves up, until your elbow is bent to an "L" shape (90 degrees). With your elbow bent, slowly move your forearm forward and up. Raise your hand up to the height of the surface that you are lying on. ? Your upper arm should not move, and your elbow should stay bent. ? At the top of the movement, your palm should face the floor. 5. Hold for 3 seconds. 6. Slowly return to the starting position and relax your muscles. Repeat for a total of 10 repetitions. Repeat 2 times. Complete this exercise 3 times per week.  Exercise M: Horizontal abduction, standing  1. Sit on a stable chair, or stand. 2. Secure an exercise band to a stable object in front of you so the band is at shoulder height. 3. Hold one end  of the exercise band in each hand. 4. Straighten your elbows and lift your hands straight in front of you, up to shoulder height. Your palms should face down, toward the floor. 5. Step back, away from the secured end of the exercise band, until the band stretches. 6. Move your arms out to your sides, and keep your arms straight. 7. Hold for 3 seconds. 8. Slowly return to the starting position. Repeat for a total of 10 repetitions. Repeat 2 times. Complete this exercise 3 times per week.  Exercise N: Scapular retraction and elevation  1. Sit on a stable chair, or stand. 2. Secure an exercise band to a stable object in front of you so the band is at shoulder height. 3. Hold one end of the exercise band in each hand. Your palms should face each other. 4. Sit in a stable chair without armrests, or stand. 5. Step back, away from the secured end of the exercise band, until the band stretches. 6. Squeeze your shoulder blades together and lift your hands over your head. Keep your elbows straight. 7. Hold for 3 seconds. 8. Slowly return to the starting position. Repeat for a total of 10 repetitions. Repeat 2 times. Complete this exercise 3 times per week.  This information is not intended to replace advice given to you by your health care provider. Make sure you discuss any questions you have with your health care provider. Document Released: 10/06/2005 Document Revised: 06/12/2016 Document Reviewed: 08/23/2015 Elsevier Interactive Patient Education  2017 [employer].

## 2017-12-28 NOTE — Progress Notes (Signed)
Pre visit review using our clinic review tool, if applicable. No additional management support is needed unless otherwise documented below in the visit note. 

## 2017-12-28 NOTE — Progress Notes (Signed)
Musculoskeletal Exam  Patient: Lindsey Owen DOB: 11/22/1965  DOS: 12/28/2017  SUBJECTIVE:  Chief Complaint:   Chief Complaint  Patient presents with  . Shoulder Pain    Lindsey Owen is a 52 y.o.  female for evaluation and treatment of L shoulder pain.   Onset:  5 days ago. No known injury or change in activity..  Location: L upper shoulder Character:  throbbing  Progression of issue:  is unchanged Associated symptoms: radiates down arm to hand, numbness in hand Treatment: to date has been OTC NSAIDS and acetaminophen.   Neurovascular symptoms: no  ROS: Musculoskeletal/Extremities: +L shoulder pain  Past Medical History:  Diagnosis Date  . Diabetes mellitus without complication (HCC)   . Hypertension   . Thyroid disease     Objective: VITAL SIGNS: BP 122/80 (BP Location: Left Arm, Patient Position: Sitting, Cuff Size: Normal)   Pulse 77   Temp 98.1 F (36.7 C) (Oral)   Ht 5\' 1"  (1.549 m)   Wt 180 lb 4 oz (81.8 kg)   SpO2 96%   BMI 34.06 kg/m  Constitutional: Well formed, well developed. No acute distress. Cardiovascular: Brisk cap refill Thorax & Lungs: No accessory muscle use Musculoskeletal: L shoulder.   Normal active range of motion: yes.   Normal passive range of motion: yes Tenderness to palpation: yes, over trapezius Deformity: no Ecchymosis: no Tests positive: none Tests negative: lift off, speed, cross over, Neer's, [facility], Obriens, Spurling's Neurologic: Decreased sensation over dorsal web space between 1st and 2nd MT, otherwise normal sensory function throughout UE's. No focal deficits noted. DTR's equal and symmetry in UE's. No clonus.  Psychiatric: Normal mood. Age appropriate judgment and insight. Alert & oriented x 3.    Assessment:  Strain of left trapezius muscle, initial encounter - Plan: acetaminophen-codeine (TYLENOL #3) 300-30 MG tablet  Plan: Cont Ibuprofen, add T3's for breakthru pain. Heat, ice, stretches/exercises.  Change  Gabapentin to venlafaxine for neuropathic pain.  F/u prn. The patient voiced understanding and agreement to the plan.   Jilda Rocheicholas Paul TopstoneWendling, DO 12/28/17  9:47 AM

## 2018-01-13 ENCOUNTER — Other Ambulatory Visit: Payer: Self-pay

## 2018-01-13 ENCOUNTER — Encounter (HOSPITAL_COMMUNITY): Payer: Self-pay | Admitting: Emergency Medicine

## 2018-01-13 ENCOUNTER — Emergency Department (HOSPITAL_COMMUNITY)
Admission: EM | Admit: 2018-01-13 | Discharge: 2018-01-13 | Disposition: A | Discharge status: Home / Self Care | Payer: BLUE CROSS/BLUE SHIELD | Attending: Emergency Medicine | Admitting: Emergency Medicine

## 2018-01-13 ENCOUNTER — Emergency Department (HOSPITAL_COMMUNITY): Payer: BLUE CROSS/BLUE SHIELD

## 2018-01-13 DIAGNOSIS — M4722 Other spondylosis with radiculopathy, cervical region: Secondary | ICD-10-CM

## 2018-01-13 DIAGNOSIS — M5412 Radiculopathy, cervical region: Secondary | ICD-10-CM

## 2018-01-13 DIAGNOSIS — R200 Anesthesia of skin: Secondary | ICD-10-CM | POA: Diagnosis not present

## 2018-01-13 LAB — COMPREHENSIVE METABOLIC PANEL
ALT: 43 U/L (ref 14–54)
AST: 46 U/L — ABNORMAL HIGH (ref 15–41)
Albumin: 3.8 g/dL (ref 3.5–5.0)
Alkaline Phosphatase: 57 U/L (ref 38–126)
Anion gap: 10 (ref 5–15)
BUN: 6 mg/dL (ref 6–20)
CO2: 29 mmol/L (ref 22–32)
Calcium: 9.7 mg/dL (ref 8.9–10.3)
Chloride: 97 mmol/L — ABNORMAL LOW (ref 101–111)
Creatinine, Ser: 0.68 mg/dL (ref 0.44–1.00)
GFR calc Af Amer: >60 mL/min (ref >60)
GFR calc non Af Amer: >60 mL/min (ref >60)
Glucose, Bld: 174 mg/dL — ABNORMAL HIGH (ref 65–99)
Potassium: 4.2 mmol/L (ref 3.5–5.1)
Sodium: 136 mmol/L (ref 135–145)
Total Bilirubin: 0.6 mg/dL (ref 0.3–1.2)
Total Protein: 7.5 g/dL (ref 6.5–8.1)

## 2018-01-13 LAB — CBC WITH DIFFERENTIAL/PLATELET
Basophils Absolute: 0 10*3/uL (ref 0.0–0.1)
Basophils Relative: 0 %
Eosinophils Absolute: 0.1 10*3/uL (ref 0.0–0.7)
Eosinophils Relative: 1 %
HCT: 44.1 % (ref 36.0–46.0)
Hemoglobin: 14.7 g/dL (ref 12.0–15.0)
Lymphocytes Relative: 38 %
Lymphs Abs: 3.4 10*3/uL (ref 0.7–4.0)
MCH: 29.7 pg (ref 26.0–34.0)
MCHC: 33.3 g/dL (ref 30.0–36.0)
MCV: 89.1 fL (ref 78.0–100.0)
Monocytes Absolute: 0.1 10*3/uL (ref 0.1–1.0)
Monocytes Relative: 1 %
Neutro Abs: 5.3 10*3/uL (ref 1.7–7.7)
Neutrophils Relative %: 60 %
Platelets: 218 10*3/uL (ref 150–400)
RBC: 4.95 MIL/uL (ref 3.87–5.11)
RDW: 13.5 % (ref 11.5–15.5)
WBC: 8.8 10*3/uL (ref 4.0–10.5)

## 2018-01-13 IMAGING — CT CT CERVICAL SPINE W/O CM
3 of 4 series · 11 of 33 positions shown, 13 images · non-contrast
Comparison: Cervical spine radiographs [DATE].

CLINICAL DATA: Left hand numbness and pressure for 1 month with
progression. Radiculopathy.

EXAM:
CT CERVICAL SPINE WITHOUT CONTRAST
TECHNIQUE: Multidetector CT imaging of the cervical spine was performed without
intravenous contrast. Multiplanar CT image reconstructions were also
generated.

[Series 5: sag bone · sagittal · 0.24mm/px · 5 of 47 slices shown, 6 images]
[im 16/47  bone]
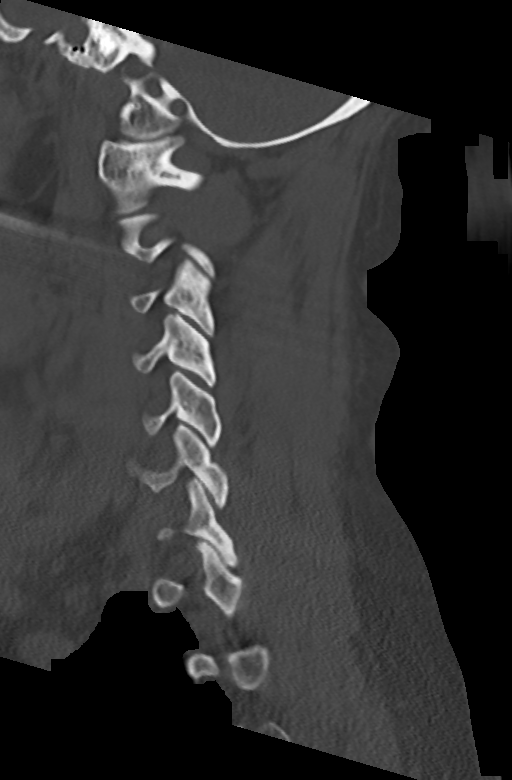
[im 20/47  bone]
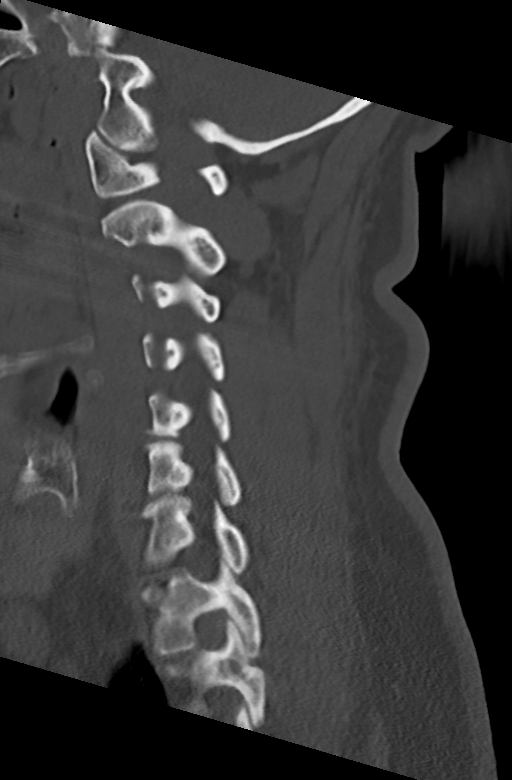
[im 24/47  soft-tissue]
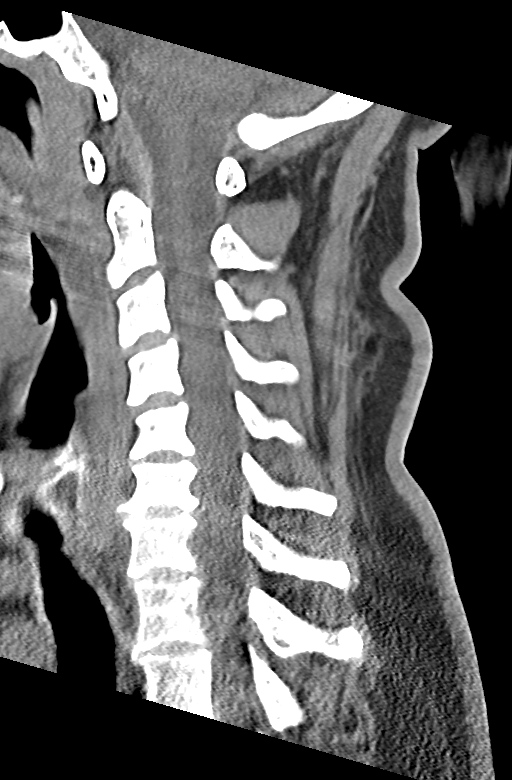
[im 24/47  bone]
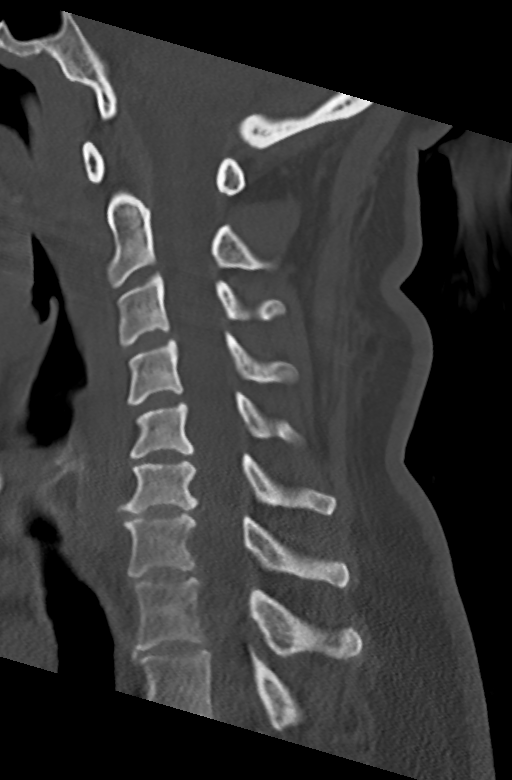
[im 27/47  bone]
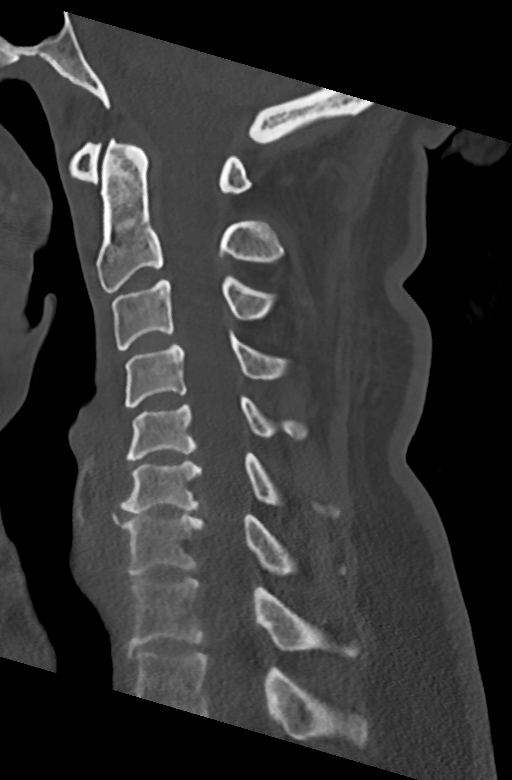
[im 31/47  bone]
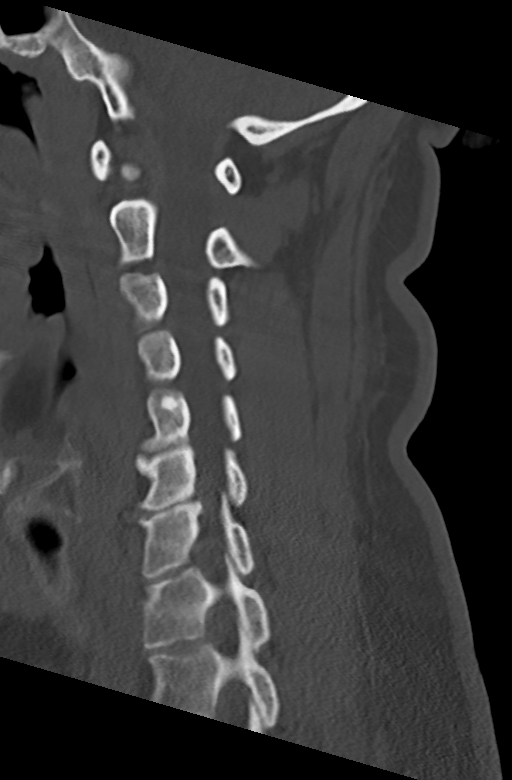

[Series 6: cor bone · coronal · 0.21mm/px · 3 of 61 slices shown]
[im 13/61  bone]
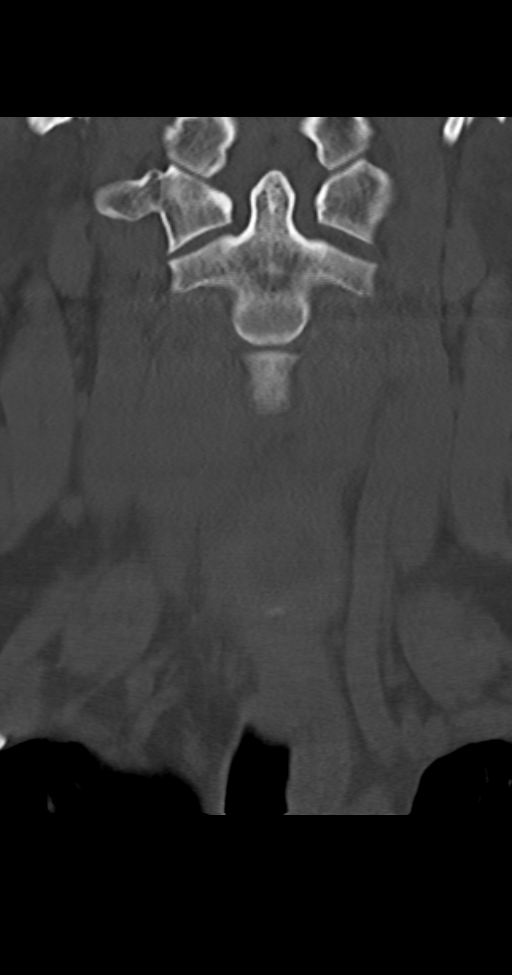
[im 25/61  bone]
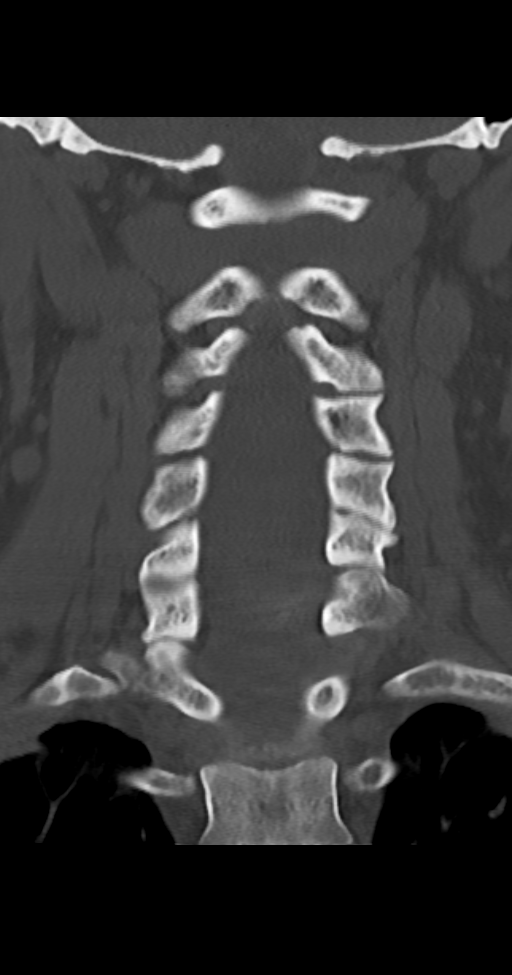
[im 37/61  bone]
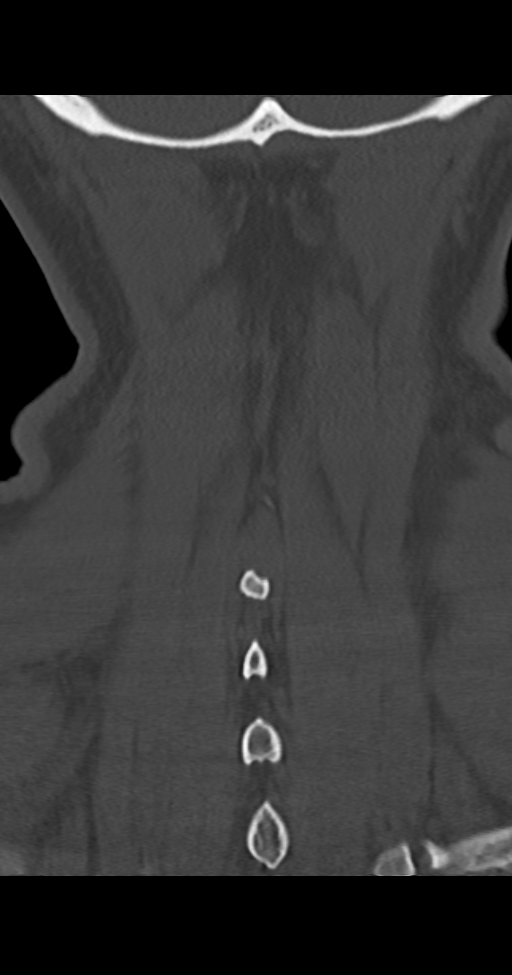

[Series 8: orthogonal axials · axial · 0.21mm/px · z∈[-165,-68]mm · 3 of 81 slices shown, 4 images]
[im 14/81  soft-tissue]
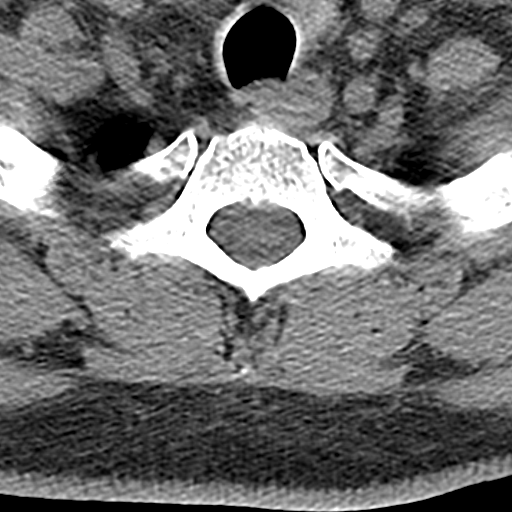
[im 14/81  bone]
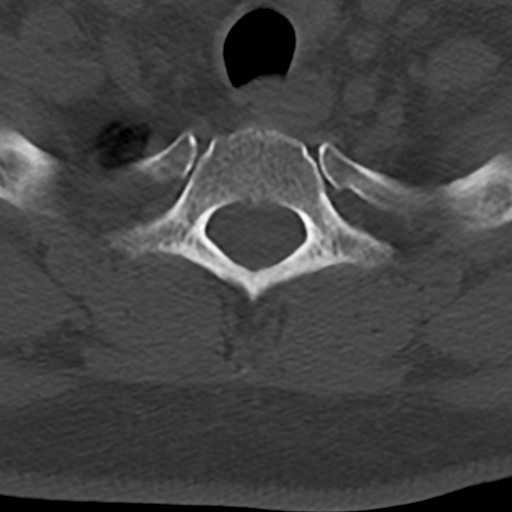
[im 41/81  bone]
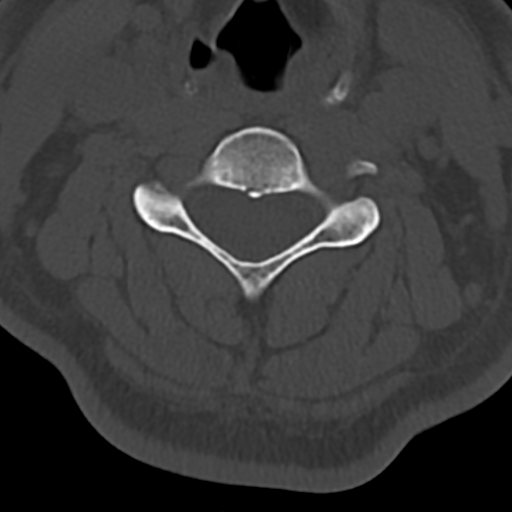
[im 67/81  bone]
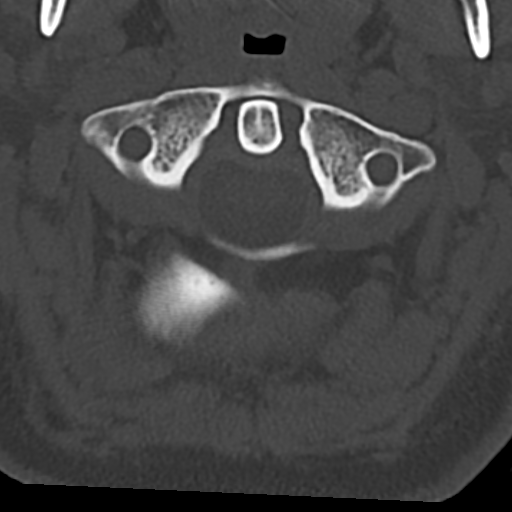

[11 of 33 positions shown; findings below may reference images not displayed]

FINDINGS: Alignment: AP alignment is anatomic. There is straightening and
slight reversal of the normal cervical lordosis.

Skull base and vertebrae: Craniocervical junction is normal.
Vertebral body heights are maintained. No acute or healing fractures
are present.

Soft tissues and spinal canal: Paraspinous soft tissues within
normal limits. Thyroid is unremarkable. Visualized salivary glands
are within normal limits. There is no significant adenopathy.

Disc levels: No significant focal disc disease is evident at C4-5 or
above.

C5-6: A broad-based disc osteophyte complex is present. Mild facet
hypertrophy is noted on left. There is effacement of the CSF on the
left. Mild left foraminal narrowing is evident.

C6-7: A broad-based disc osteophyte complex present. Uncovertebral
spurring is worse on the left. There is effacement of ventral CSF on
the left. Moderate left and mild right foraminal narrowing is
present.

C7-T1: Mild facet hypertrophy is present. No focal disc protrusion
or stenosis is evident.

Upper chest: The lung apices are clear without focal nodule, mass,
or airspace disease.
IMPRESSION: 1. Moderate left and mild right foraminal narrowing at C6-7
secondary to a broad-based disc osteophyte complex and left greater
than right uncovertebral spurring.
2. Mild left central canal stenosis at C5-6 and C6-7 secondary to a
broad-based disc osteophyte complex.
3. Mild right foraminal stenosis at C6-7.

## 2018-01-13 MED ORDER — DIAZEPAM 5 MG PO TABS: 5.0000 mg | ORAL_TABLET | Freq: Four times a day (QID) | ORAL | 0 refills | Status: DC | PRN

## 2018-01-13 MED ORDER — PREDNISONE 10 MG PO TABS: ORAL_TABLET | ORAL | 0 refills | Status: DC

## 2018-01-13 MED ORDER — DIAZEPAM 5 MG PO TABS
5.0000 mg | ORAL_TABLET | Freq: Once | ORAL | Status: AC
  Administered 2018-01-13: 5 mg via ORAL
  Filled 2018-01-13: qty 1

## 2018-01-13 MED ORDER — HYDROCODONE-ACETAMINOPHEN 5-325 MG PO TABS: 1.0000 | ORAL_TABLET | ORAL | 0 refills | Status: DC | PRN

## 2018-01-13 MED ORDER — PREDNISONE 20 MG PO TABS
60.0000 mg | ORAL_TABLET | Freq: Once | ORAL | Status: AC
  Administered 2018-01-13: 60 mg via ORAL
  Filled 2018-01-13: qty 3

## 2018-01-13 MED ORDER — HYDROCODONE-ACETAMINOPHEN 5-325 MG PO TABS
1.0000 | ORAL_TABLET | Freq: Once | ORAL | Status: AC
  Administered 2018-01-13: 1 via ORAL
  Filled 2018-01-13: qty 1

## 2018-01-13 NOTE — ED Provider Notes (Signed)
Patient placed in Quick Look pathway, seen and evaluated   Chief Complaint: Left arm numbness, pressure and tingling worsening over the course of 1 month  HPI:   Blood pressure 136/90, pulse 71, temperature 98 F (36.7 C), temperature source Oral, resp. rate 16, SpO2 100 %.  Lindsey Owen is a 52 y.o. female complaining of left arm numbness, tingling and pressure-like feeling worsening over the course of 3 months.  Seen by [facility] PCP given gabapentin and Vicodin with little relief.  She had an appointment with Dr. Ezzard StandingNewman this morning but there was a air in the scheduling system and the appointment got lost.    ROS: She has no weakness.  No associated chest pain, neck pain, headache.  Physical Exam:   Gen: No distress  Neuro: Awake and Alert  Skin: Warm    Focused Exam: No midline C-spine  tenderness to palpation or step-offs appreciated. Patient has full range of motion without pain. Grip strength, biceps, triceps 5/5 bilaterally;  can differentiate between pinprick and light touch bilaterally.  Spurling test is negative bilaterally.   Initiation of care has begun. The patient has been counseled on the process, plan, and necessity for staying for the completion/evaluation, and the remainder of the medical screening examination    Lynetta Mareisciotta, Mardella Laymanicole, PA-C 01/13/18 1650    Melene PlanFloyd, Dan, DO 01/14/18 [phone]

## 2018-01-13 NOTE — ED Notes (Signed)
Joni ReiningNicole, [place] in for [facility] at triage.

## 2018-01-13 NOTE — Discharge Instructions (Addendum)
The CAT scan results today show a source for the discomfort in your cervical spine.  See the impression below:   IMPRESSION: 1. Moderate left and mild right foraminal narrowing at C6-7 secondary to a broad-based disc osteophyte complex and left greater than right uncovertebral spurring. 2. Mild left central canal stenosis at C5-6 and C6-7 secondary to a broad-based disc osteophyte complex. 3. Mild right foraminal stenosis at C6-7.   Continue taking the medication prescribed by your doctor.  We are going to put you on a prolonged course of prednisone, and treat the discomfort with hydrocodone and a muscle relaxer.  Try using heat on the area of discomfort several times each day.  Do not drive when taking the sedating medications.  Follow-up with your neurosurgeon, for further care and treatment as soon as possible.

## 2018-01-13 NOTE — ED Provider Notes (Signed)
[facility] EMERGENCY DEPARTMENT Provider Note   CSN: [id number] Arrival date & time: 01/13/18  1613     History   Chief Complaint Chief Complaint  Patient presents with  . arm numbness    HPI Stacy Sailer is a 52 y.o. female.     She complains of pain and tingling left-sided, trapezius area to fingers of hand.  The discomfort comes and goes and is worse with some movements.  Onset 3 weeks ago, without trauma.  No distinct neck pain or worsening of pain with movements of neck or left shoulder.  No recent fever, chills, nausea, vomiting, focal weakness.  She saw her PCP for this and was advised to use nonsteroidal anti-inflammatory medications, and exercises.  She was also treated with an injection of methylprednisolone, and oral prednisone.  She was started on venlafaxine, to treat nerve pain.  These have not helped.  There are no other known modifying factors.  HPI  Past Medical History:  Diagnosis Date  . Diabetes mellitus without complication (HCC)   . Hypertension   . Thyroid disease     Patient Active Problem List   Diagnosis Date Noted  . Joint pain 11/26/2015  . Obesity 05/21/2015  . Essential hypertension 03/19/2015  . Type 2 diabetes mellitus with hyperglycemia, without long-term current use of insulin (HCC) 03/19/2015  . Other specified hypothyroidism 03/19/2015    Past Surgical History:  Procedure Laterality Date  . CESAREAN SECTION  2000  . UTERINE FIBROID SURGERY       OB History   None      Home Medications    Prior to Admission medications   Medication Sig Start Date End Date Taking? Authorizing Provider  atorvastatin (LIPITOR) 40 MG tablet Take 1 tablet (40 mg total) by mouth daily. 02/09/17   Sharlene Dory, DO  Calcium Carbonate-Vit D-Min (CALCIUM 1200 PO) Take by mouth.    [provider]  diazepam (VALIUM) 5 MG tablet Take 1 tablet (5 mg total) by mouth every 6 (six) hours as needed for muscle spasms  (spasms). 01/13/18   Mancel Bale, MD  ferrous sulfate 325 (65 FE) MG tablet Take 1 tablet by mouth daily.    [provider]  fluticasone (FLONASE) 50 MCG/ACT nasal spray Place 2 sprays into both nostrils daily. 10/23/17   Sharlene Dory, DO  HYDROcodone-acetaminophen (NORCO) 5-325 MG tablet Take 1 tablet by mouth every 4 (four) hours as needed for moderate pain. 01/13/18   Mancel Bale, MD  levothyroxine (SYNTHROID, LEVOTHROID) 125 MCG tablet Take 1 tablet (125 mcg total) by mouth daily before breakfast. 09/23/17   Wendling, Jilda Roche, DO  lisinopril-hydrochlorothiazide (PRINZIDE,ZESTORETIC) 20-12.5 MG tablet Take 1 tablet by mouth daily. 10/23/17   Sharlene Dory, DO  metFORMIN (GLUCOPHAGE) 500 MG tablet Take 1 tablet (500 mg total) by mouth 2 (two) times daily with a meal. 10/05/17   Wendling, Jilda Roche, DO  Omega-3 Fatty Acids (FISH OIL) 1000 MG CAPS Take by mouth.    [provider]  pioglitazone (ACTOS) 30 MG tablet Take 1 tablet (30 mg total) by mouth daily. 10/30/17   Sharlene Dory, DO  predniSONE (DELTASONE) 10 MG tablet Take q day 6,6,5,5,4,4,3,3,2,2,1,1 01/13/18   Mancel Bale, MD  venlafaxine XR (EFFEXOR-XR) 37.5 MG 24 hr capsule Take 1 capsule (37.5 mg total) by mouth daily with breakfast. 12/28/17   Wendling, Jilda Roche, DO    Family History Family History  Problem Relation Age of Onset  .  Kidney disease Mother        Kidney Failure  . Heart disease Father        MI    Social History Social History   Tobacco Use  . Smoking status: Never Smoker  . Smokeless tobacco: Never Used  Substance Use Topics  . Alcohol use: Never    Frequency: Never  . Drug use: No     Allergies   Patient has no known allergies.   Review of Systems Review of Systems  All other systems reviewed and are negative.    Physical Exam Updated Vital Signs BP (!) 134/96 (BP Location: Left Arm)   Pulse 63   Temp 98.1 F (36.7 C) (Oral)    Resp 16   SpO2 97%   Physical Exam  Constitutional: She is oriented to person, place, and time. She appears well-developed and well-nourished.  HENT:  Head: Normocephalic and atraumatic.  Eyes: Pupils are equal, round, and reactive to light. Conjunctivae and EOM are normal.  Neck: Normal range of motion and phonation normal. Neck supple.  Cardiovascular: Normal rate.  Pulmonary/Chest: Effort normal.  Musculoskeletal: Normal range of motion.  Neurological: She is alert and oriented to person, place, and time. She exhibits normal muscle tone.  Normal sensation, circulation and function of the left and right hand bilaterally.  Normal range of motion left arm and neck.  Skin: Skin is warm and dry.  Psychiatric: She has a normal mood and affect. Her behavior is normal. Judgment and thought content normal.  Nursing note and vitals reviewed.    ED Treatments / Results  Labs (all labs ordered are listed, but only abnormal results are displayed) Labs Reviewed  COMPREHENSIVE METABOLIC PANEL - Abnormal; Notable for the following components:      Result Value   Chloride 97 (*)    Glucose, Bld 174 (*)    AST 46 (*)    All other components within normal limits  CBC WITH DIFFERENTIAL/PLATELET    EKG None  Radiology Ct Cervical Spine Wo Contrast  Result Date: 01/13/2018 CLINICAL DATA:  Left hand numbness and pressure for 1 month with progression. Radiculopathy. EXAM: CT CERVICAL SPINE WITHOUT CONTRAST TECHNIQUE: Multidetector CT imaging of the cervical spine was performed without intravenous contrast. Multiplanar CT image reconstructions were also generated. COMPARISON:  Cervical spine radiographs 02/04/2016. FINDINGS: Alignment: AP alignment is anatomic. There is straightening and slight reversal of the normal cervical lordosis. Skull base and vertebrae: Craniocervical junction is normal. Vertebral body heights are maintained. No acute or healing fractures are present. Soft tissues and  spinal canal: Paraspinous soft tissues within normal limits. Thyroid is unremarkable. Visualized salivary glands are within normal limits. There is no significant adenopathy. Disc levels: No significant focal disc disease is evident at C4-5 or above. C5-6: A broad-based disc osteophyte complex is present. Mild facet hypertrophy is noted on left. There is effacement of the CSF on the left. Mild left foraminal narrowing is evident. C6-7: A broad-based disc osteophyte complex present. Uncovertebral spurring is worse on the left. There is effacement of ventral CSF on the left. Moderate left and mild right foraminal narrowing is present. C7-T1: Mild facet hypertrophy is present. No focal disc protrusion or stenosis is evident. Upper chest: The lung apices are clear without focal nodule, mass, or airspace disease. IMPRESSION: 1. Moderate left and mild right foraminal narrowing at C6-7 secondary to a broad-based disc osteophyte complex and left greater than right uncovertebral spurring. 2. Mild left central canal stenosis at C5-6  and C6-7 secondary to a broad-based disc osteophyte complex. 3. Mild right foraminal stenosis at C6-7. Electronically Signed   By: Marin Roberts M.D.   On: 01/13/2018 17:28    Procedures Procedures (including critical care time)  Medications Ordered in ED Medications  predniSONE (DELTASONE) tablet 60 mg (60 mg Oral Given 01/13/18 2255)  diazepam (VALIUM) tablet 5 mg (5 mg Oral Given 01/13/18 2254)  HYDROcodone-acetaminophen (NORCO/VICODIN) 5-325 MG per tablet 1 tablet (1 tablet Oral Given 01/13/18 2254)     Initial Impression / Assessment and Plan / ED Course  I have reviewed the triage vital signs and the nursing notes.  Pertinent labs & imaging results that were available during my care of the patient were reviewed by me and considered in my medical decision making (see chart for details).  Clinical Course as of Jan 16 1745  Wed Jan 13, 2018  2134 CT Cervical Spine Wo  Contrast [EW]    Clinical Course User Index [EW] Mancel Bale, MD     No data found.  At discharge- reevaluation with update and discussion. After initial assessment and treatment, an updated evaluation reveals no change in status, she remains fairly comfortable, findings discussed and questions answered. Mancel Bale   Medical decision making-evaluation is consistent with cervical radiculopathy, likely from degenerative disc changes with nerve compression.  Doubt fracture or spinal myelopathy.  Patient symptoms can be treated as an outpatient with medication management.  Nursing Notes Reviewed/ Care Coordinated Applicable Imaging Reviewed Interpretation of Laboratory Data incorporated into ED treatment  The patient appears reasonably screened and/or stabilized for discharge and I doubt any other medical condition or other [facility] requiring further screening, evaluation, or treatment in the ED at this time prior to discharge.  Plan: Home Medications-continue current medications; Home Treatments-heat to affected area; return here if the recommended treatment, does not improve the symptoms; Recommended follow up-neurosurgery follow-up as soon as possible    Final Clinical Impressions(s) / ED Diagnoses   Final diagnoses:  Cervical radiculopathy at C6  Osteoarthritis of spine with radiculopathy, cervical region    ED Discharge Orders        Ordered    HYDROcodone-acetaminophen (NORCO) 5-325 MG tablet  Every 4 hours PRN     01/13/18 2248    predniSONE (DELTASONE) 10 MG tablet     01/13/18 2248    diazepam (VALIUM) 5 MG tablet  Every 6 hours PRN     01/13/18 2248       Mancel Bale, MD 01/16/18 1746

## 2018-01-13 NOTE — ED Triage Notes (Addendum)
C/o L arm pressure, tingling, and numbness x 3 weeks that is gradually getting worse.  No arm drift.  Pt thought she had appt with Dr.Elsner today but got there and appt was not today so she was advised to come to ED.  Denies chest pain.

## 2018-01-14 DIAGNOSIS — M4722 Other spondylosis with radiculopathy, cervical region: Secondary | ICD-10-CM | POA: Diagnosis not present

## 2018-01-14 DIAGNOSIS — M542 Cervicalgia: Secondary | ICD-10-CM | POA: Diagnosis not present

## 2018-01-14 DIAGNOSIS — M5030 Other cervical disc degeneration, unspecified cervical region: Secondary | ICD-10-CM | POA: Diagnosis not present

## 2018-01-14 DIAGNOSIS — M5412 Radiculopathy, cervical region: Secondary | ICD-10-CM | POA: Diagnosis not present

## 2018-01-18 DIAGNOSIS — M5412 Radiculopathy, cervical region: Secondary | ICD-10-CM | POA: Diagnosis not present

## 2018-01-18 DIAGNOSIS — M5030 Other cervical disc degeneration, unspecified cervical region: Secondary | ICD-10-CM | POA: Diagnosis not present

## 2018-01-18 DIAGNOSIS — M5020 Other cervical disc displacement, unspecified cervical region: Secondary | ICD-10-CM | POA: Diagnosis not present

## 2018-01-18 DIAGNOSIS — M4722 Other spondylosis with radiculopathy, cervical region: Secondary | ICD-10-CM | POA: Diagnosis not present

## 2018-01-25 DIAGNOSIS — M47812 Spondylosis without myelopathy or radiculopathy, cervical region: Secondary | ICD-10-CM | POA: Diagnosis not present

## 2018-01-25 DIAGNOSIS — M5020 Other cervical disc displacement, unspecified cervical region: Secondary | ICD-10-CM | POA: Diagnosis not present

## 2018-01-25 DIAGNOSIS — M50322 Other cervical disc degeneration at C5-C6 level: Secondary | ICD-10-CM | POA: Diagnosis not present

## 2018-01-25 DIAGNOSIS — M50222 Other cervical disc displacement at C5-C6 level: Secondary | ICD-10-CM | POA: Diagnosis not present

## 2018-02-22 DIAGNOSIS — M5020 Other cervical disc displacement, unspecified cervical region: Secondary | ICD-10-CM | POA: Diagnosis not present

## 2018-04-27 DIAGNOSIS — Z981 Arthrodesis status: Secondary | ICD-10-CM | POA: Diagnosis not present

## 2018-04-27 DIAGNOSIS — M47812 Spondylosis without myelopathy or radiculopathy, cervical region: Secondary | ICD-10-CM | POA: Diagnosis not present

## 2018-04-27 DIAGNOSIS — M5030 Other cervical disc degeneration, unspecified cervical region: Secondary | ICD-10-CM | POA: Diagnosis not present

## 2018-04-28 ENCOUNTER — Ambulatory Visit (INDEPENDENT_AMBULATORY_CARE_PROVIDER_SITE_OTHER): Payer: BLUE CROSS/BLUE SHIELD | Admitting: Family Medicine

## 2018-04-28 ENCOUNTER — Encounter: Payer: Self-pay | Admitting: Family Medicine

## 2018-04-28 VITALS — BP 124/86 | HR 79 | Temp 98.2°F | Ht 61.0 in | Wt 182.6 lb

## 2018-04-28 DIAGNOSIS — H6983 Other specified disorders of Eustachian tube, bilateral: Secondary | ICD-10-CM | POA: Diagnosis not present

## 2018-04-28 DIAGNOSIS — R51 Headache: Secondary | ICD-10-CM

## 2018-04-28 DIAGNOSIS — I10 Essential (primary) hypertension: Secondary | ICD-10-CM | POA: Diagnosis not present

## 2018-04-28 DIAGNOSIS — R519 Headache, unspecified: Secondary | ICD-10-CM

## 2018-04-28 MED ORDER — METHYLPREDNISOLONE ACETATE 80 MG/ML IJ SUSP
80.0000 mg | Freq: Once | INTRAMUSCULAR | Status: AC
  Administered 2018-04-28: 80 mg via INTRAMUSCULAR

## 2018-04-28 MED ORDER — LISINOPRIL-HYDROCHLOROTHIAZIDE 20-12.5 MG PO TABS: 1.0000 | ORAL_TABLET | Freq: Every day | ORAL | 2 refills | Status: DC

## 2018-04-28 MED ORDER — PIOGLITAZONE HCL 30 MG PO TABS: 30.0000 mg | ORAL_TABLET | Freq: Every day | ORAL | 2 refills | Status: DC

## 2018-04-28 NOTE — Patient Instructions (Addendum)
Continue Flonase.  Cont to ck BP intermittently.  Let us know if you need anything.

## 2018-04-28 NOTE — Progress Notes (Signed)
Chief Complaint  Patient presents with  . Ear Fullness    c/o both ears feeling full x 3 weeks. Pt states that she's flying on a plane tommorow.     Subjective: Patient is a 52 y.o. female here for ear fullness.  3 weeks, no pain or drainage. Thought it was allergies, but did not resolve. Has not tried anything at home.    ROS: Heart: Denies chest pain  Lungs: Denies SOB   Past Medical History:  Diagnosis Date  . Diabetes mellitus without complication (HCC)   . Hypertension   . Thyroid disease     Objective: BP 124/86 (BP Location: Left Arm, Patient Position: Sitting, Cuff Size: Normal)   Pulse 79   Temp 98.2 F (36.8 C) (Oral)   Ht 5\' 1"  (1.549 m)   Wt 182 lb 9.6 oz (82.8 kg)   SpO2 99%   BMI 34.50 kg/m  General: Awake, appears stated age HEENT: MMM, EOMi Heart: RRR, no murmurs Lungs: CTAB, no rales, wheezes or rhonchi. No accessory muscle use Psych: Age appropriate judgment and insight, normal affect and mood  Assessment and Plan: Dysfunction of both eustachian tubes  Nonintractable headache, unspecified chronicity pattern, unspecified headache type  Essential hypertension - Plan: lisinopril-hydrochlorothiazide (PRINZIDE,ZESTORETIC) 20-12.5 MG tablet  Orders as above. INCS. F/u prn.  The patient voiced understanding and agreement to the plan.  Jilda Rocheicholas Paul Las LomasWendling, DO 04/28/18  1:47 PM

## 2018-04-28 NOTE — Addendum Note (Signed)
Addended by: Cammy CopaHANDLER, Izabelle Daus N on: 04/28/2018 01:57 PM   Modules accepted: Orders

## 2018-05-27 ENCOUNTER — Telehealth: Payer: Self-pay | Admitting: Family Medicine

## 2018-05-27 NOTE — Telephone Encounter (Signed)
Flonase and 2 Aleve twice daily for headache. TY.

## 2018-05-27 NOTE — Telephone Encounter (Signed)
Patient informed of PCP instructions. She has been doing  Already.  Suggested then that she make an appointment She was unable to make an appt. At this time and will continue doing what she is doing/what PCP advised. Will call back if does not improve.

## 2018-05-27 NOTE — Telephone Encounter (Signed)
Copied from CRM [id number]. Topic: General - Other >> May 27, 2018 12:20 PM Percival SpanishKennedy, Cheryl W wrote:  Pt said she is having the same symptons she had over a month ago, clogged ears, headache choking feeling and she is asking if there is something she can take over the counter or does she need to come back in  [phone]

## 2018-06-09 ENCOUNTER — Other Ambulatory Visit: Payer: Self-pay | Admitting: Family Medicine

## 2018-08-06 ENCOUNTER — Ambulatory Visit (INDEPENDENT_AMBULATORY_CARE_PROVIDER_SITE_OTHER): Payer: BLUE CROSS/BLUE SHIELD | Admitting: Family Medicine

## 2018-08-06 ENCOUNTER — Encounter: Payer: Self-pay | Admitting: Family Medicine

## 2018-08-06 VITALS — BP 130/86 | HR 66 | Temp 98.3°F | Ht 61.0 in | Wt 188.1 lb

## 2018-08-06 DIAGNOSIS — S46819A Strain of other muscles, fascia and tendons at shoulder and upper arm level, unspecified arm, initial encounter: Secondary | ICD-10-CM | POA: Diagnosis not present

## 2018-08-06 DIAGNOSIS — M545 Low back pain, unspecified: Secondary | ICD-10-CM

## 2018-08-06 DIAGNOSIS — E119 Type 2 diabetes mellitus without complications: Secondary | ICD-10-CM

## 2018-08-06 DIAGNOSIS — I10 Essential (primary) hypertension: Secondary | ICD-10-CM | POA: Diagnosis not present

## 2018-08-06 MED ORDER — LISINOPRIL-HYDROCHLOROTHIAZIDE 20-12.5 MG PO TABS: 1.0000 | ORAL_TABLET | Freq: Every day | ORAL | 2 refills | Status: DC

## 2018-08-06 MED ORDER — VENLAFAXINE HCL ER 37.5 MG PO CP24: 37.5000 mg | ORAL_CAPSULE | Freq: Every day | ORAL | 1 refills | Status: DC

## 2018-08-06 MED ORDER — METFORMIN HCL 500 MG PO TABS: 500.0000 mg | ORAL_TABLET | Freq: Two times a day (BID) | ORAL | 1 refills | Status: DC

## 2018-08-06 MED ORDER — PIOGLITAZONE HCL 30 MG PO TABS: 30.0000 mg | ORAL_TABLET | Freq: Every day | ORAL | 2 refills | Status: DC

## 2018-08-06 MED ORDER — MELOXICAM 15 MG PO TABS: 15.0000 mg | ORAL_TABLET | Freq: Every day | ORAL | 0 refills | Status: DC

## 2018-08-06 MED ORDER — LEVOTHYROXINE SODIUM 125 MCG PO TABS: 125.0000 ug | ORAL_TABLET | Freq: Every day | ORAL | 1 refills | Status: DC

## 2018-08-06 MED ORDER — METHYLPREDNISOLONE ACETATE 40 MG/ML IJ SUSP
40.0000 mg | Freq: Once | INTRAMUSCULAR | Status: AC
Start: 2018-08-06 — End: 2018-08-06
  Administered 2018-08-06: 40 mg via INTRAMUSCULAR

## 2018-08-06 MED ORDER — CYCLOBENZAPRINE HCL 10 MG PO TABS: 10.0000 mg | ORAL_TABLET | Freq: Three times a day (TID) | ORAL | 0 refills | Status: DC | PRN

## 2018-08-06 NOTE — Progress Notes (Signed)
Pre visit review using our clinic review tool, if applicable. No additional management support is needed unless otherwise documented below in the visit note. 

## 2018-08-06 NOTE — Patient Instructions (Addendum)
Heat (pad or rice pillow in microwave) over affected area, 10-15 minutes twice daily.   Ice/cold pack over area for 10-15 min twice daily.  OK to take Tylenol 1000 mg (2 extra strength tabs) or 975 mg (3 regular strength tabs) every 6 hours as needed.  Trapezius stretches/exercises Do exercises exactly as told by your health care provider and adjust them as directed. It is normal to feel mild stretching, pulling, tightness, or discomfort as you do these exercises, but you should stop right away if you feel sudden pain or your pain gets worse.  Stretching and range of motion exercises These exercises warm up your muscles and joints and improve the movement and flexibility of your shoulder. These exercises can also help to relieve pain, numbness, and tingling. If you are unable to do any of the following for any reason, do not further attempt to do it.   Exercise A: Flexion, standing    1. Stand and hold a broomstick, a cane, or a similar object. Place your hands a little more than shoulder-width apart on the object. Your left / right hand should be palm-up, and your other hand should be palm-down. 2. Push the stick to raise your left / right arm out to your side and then over your head. Use your other hand to help move the stick. Stop when you feel a stretch in your shoulder, or when you reach the angle that is recommended by your health care provider. ? Avoid shrugging your shoulder while you raise your arm. Keep your shoulder blade tucked down toward your spine. 3. Hold for 30 seconds. 4. Slowly return to the starting position. Repeat 2 times. Complete this exercise 3 times per week.  Exercise B: Abduction, supine    1. Lie on your back and hold a broomstick, a cane, or a similar object. Place your hands a little more than shoulder-width apart on the object. Your left / right hand should be palm-up, and your other hand should be palm-down. 2. Push the stick to raise your left / right  arm out to your side and then over your head. Use your other hand to help move the stick. Stop when you feel a stretch in your shoulder, or when you reach the angle that is recommended by your health care provider. ? Avoid shrugging your shoulder while you raise your arm. Keep your shoulder blade tucked down toward your spine. 3. Hold for 30 seconds. 4. Slowly return to the starting position. Repeat 2 times. Complete this exercise 3 times per week.  Exercise C: Flexion, active-assisted    1. Lie on your back. You may bend your knees for comfort. 2. Hold a broomstick, a cane, or a similar object. Place your hands about shoulder-width apart on the object. Your palms should face toward your feet. 3. Raise the stick and move your arms over your head and behind your head, toward the floor. Use your healthy arm to help your left / right arm move farther. Stop when you feel a gentle stretch in your shoulder, or when you reach the angle where your health care provider tells you to stop. 4. Hold for 30 seconds. 5. Slowly return to the starting position. Repeat 2 times. Complete this exercise 3 times per week.  Exercise D: External rotation and abduction    1. Stand in a door frame with one of your feet slightly in front of the other. This is called a staggered stance. 2. Choose one of the  following positions as told by your health care provider: ? Place your hands and forearms on the door frame above your head. ? Place your hands and forearms on the door frame at the height of your head. ? Place your hands on the door frame at the height of your elbows. 3. Slowly move your weight onto your front foot until you feel a stretch across your chest and in the front of your shoulders. Keep your head and chest upright and keep your abdominal muscles tight. 4. Hold for 30 seconds. 5. To release the stretch, shift your weight to your back foot. Repeat 2 times. Complete this stretch 3 times per  week.  Strengthening exercises These exercises build strength and endurance in your shoulder. Endurance is the ability to use your muscles for a long time, even after your muscles get tired. Exercise E: Scapular depression and adduction  1. Sit on a stable chair. Support your arms in front of you with pillows, armrests, or a tabletop. Keep your elbows in line with the sides of your body. 2. Gently move your shoulder blades down toward your middle back. Relax the muscles on the tops of your shoulders and in the back of your neck. 3. Hold for 3 seconds. 4. Slowly release the tension and relax your muscles completely before doing this exercise again. Repeat for a total of 10 repetitions. 5. After you have practiced this exercise, try doing the exercise without the arm support. Then, try the exercise while standing instead of sitting. Repeat 2 times. Complete this exercise 3 times per week.  Exercise F: Shoulder abduction, isometric    1. Stand or sit about 4-6 inches (10-15 cm) from a wall with your left / right side facing the wall. 2. Bend your left / right elbow and gently press your elbow against the wall. 3. Increase the pressure slowly until you are pressing as hard as you can without shrugging your shoulder. 4. Hold for 3 seconds. 5. Slowly release the tension and relax your muscles completely. Repeat for a total of 10 repetitions. Repeat 2 times. Complete this exercise 3 times per week.  Exercise G: Shoulder flexion, isometric    1. Stand or sit about 4-6 inches (10-15 cm) away from a wall with your left / right side facing the wall. 2. Keep your left / right elbow straight and gently press the top of your fist against the wall. Increase the pressure slowly until you are pressing as hard as you can without shrugging your shoulder. 3. Hold for 10-15 seconds. 4. Slowly release the tension and relax your muscles completely. Repeat for a total of 10 repetitions. Repeat 2 times.  Complete this exercise 3 times per week.  Exercise H: Internal rotation    1. Sit in a stable chair without armrests, or stand. Secure an exercise band at your left / right side, at elbow height. 2. Place a soft object, such as a folded towel or a small pillow, under your left / right upper arm so your elbow is a few inches (about 8 cm) away from your side. 3. Hold the end of the exercise band so the band stretches. 4. Keeping your elbow pressed against the soft object under your arm, move your forearm across your body toward your abdomen. Keep your body steady so the movement is only coming from your shoulder. 5. Hold for 3 seconds. 6. Slowly return to the starting position. Repeat for a total of 10 repetitions. Repeat 2 times.  Complete this exercise 3 times per week.  Exercise I: External rotation    1. Sit in a stable chair without armrests, or stand. 2. Secure an exercise band at your left / right side, at elbow height. 3. Place a soft object, such as a folded towel or a small pillow, under your left / right upper arm so your elbow is a few inches (about 8 cm) away from your side. 4. Hold the end of the exercise band so the band stretches. 5. Keeping your elbow pressed against the soft object under your arm, move your forearm out, away from your abdomen. Keep your body steady so the movement is only coming from your shoulder. 6. Hold for 3 seconds. 7. Slowly return to the starting position. Repeat for a total of 10 repetitions. Repeat 2 times. Complete this exercise 3 times per week. Exercise J: Shoulder extension  1. Sit in a stable chair without armrests, or stand. Secure an exercise band to a stable object in front of you so the band is at shoulder height. 2. Hold one end of the exercise band in each hand. Your palms should face each other. 3. Straighten your elbows and lift your hands up to shoulder height. 4. Step back, away from the secured end of the exercise band, until the  band stretches. 5. Squeeze your shoulder blades together and pull your hands down to the sides of your thighs. Stop when your hands are straight down by your sides. Do not let your hands go behind your body. 6. Hold for 3 seconds. 7. Slowly return to the starting position. Repeat for a total of 10 repetitions. Repeat 2 times. Complete this exercise 3 times per week.  Exercise K: Shoulder extension, prone    1. Lie on your abdomen on a firm surface so your left / right arm hangs over the edge. 2. Hold a 5 lb weight in your hand so your palm faces in toward your body. Your arm should be straight. 3. Squeeze your shoulder blade down toward the middle of your back. 4. Slowly raise your arm behind you, up to the height of the surface that you are lying on. Keep your arm straight. 5. Hold for 3 seconds. 6. Slowly return to the starting position and relax your muscles. Repeat for a total of 10 repetitions. Repeat 2 times. Complete this exercise 3 times per week.   Exercise L: Horizontal abduction, prone  1. Lie on your abdomen on a firm surface so your left / right arm hangs over the edge. 2. Hold a 5 lb weight in your hand so your palm faces toward your feet. Your arm should be straight. 3. Squeeze your shoulder blade down toward the middle of your back. 4. Bend your elbow so your hand moves up, until your elbow is bent to an "L" shape (90 degrees). With your elbow bent, slowly move your forearm forward and up. Raise your hand up to the height of the surface that you are lying on. ? Your upper arm should not move, and your elbow should stay bent. ? At the top of the movement, your palm should face the floor. 5. Hold for 3 seconds. 6. Slowly return to the starting position and relax your muscles. Repeat for a total of 10 repetitions. Repeat 2 times. Complete this exercise 3 times per week.  Exercise M: Horizontal abduction, standing  1. Sit on a stable chair, or stand. 2. Secure an exercise  band to a stable  object in front of you so the band is at shoulder height. 3. Hold one end of the exercise band in each hand. 4. Straighten your elbows and lift your hands straight in front of you, up to shoulder height. Your palms should face down, toward the floor. 5. Step back, away from the secured end of the exercise band, until the band stretches. 6. Move your arms out to your sides, and keep your arms straight. 7. Hold for 3 seconds. 8. Slowly return to the starting position. Repeat for a total of 10 repetitions. Repeat 2 times. Complete this exercise 3 times per week.  Exercise N: Scapular retraction and elevation  1. Sit on a stable chair, or stand. 2. Secure an exercise band to a stable object in front of you so the band is at shoulder height. 3. Hold one end of the exercise band in each hand. Your palms should face each other. 4. Sit in a stable chair without armrests, or stand. 5. Step back, away from the secured end of the exercise band, until the band stretches. 6. Squeeze your shoulder blades together and lift your hands over your head. Keep your elbows straight. 7. Hold for 3 seconds. 8. Slowly return to the starting position. Repeat for a total of 10 repetitions. Repeat 2 times. Complete this exercise 3 times per week.  This information is not intended to replace advice given to you by your health care provider. Make sure you discuss any questions you have with your health care provider. Document Released: 10/06/2005 Document Revised: 06/12/2016 Document Reviewed: 08/23/2015 [employer] Interactive Patient Education  2017 [employer]  EXERCISES  RANGE OF MOTION (ROM) AND STRETCHING EXERCISES - Low Back Pain Most people with lower back pain will find that their symptoms get worse with excessive bending forward (flexion) or arching at the lower back (extension). The exercises that will help resolve your symptoms will focus on the opposite motion.  If you have pain, numbness  or tingling which travels down into your buttocks, leg or foot, the goal of the therapy is for these symptoms to move closer to your back and eventually resolve. Sometimes, these leg symptoms will get better, but your lower back pain may worsen. This is often an indication of progress in your rehabilitation. Be very alert to any changes in your symptoms and the activities in which you participated in the 24 hours prior to the change. Sharing this information with your caregiver will allow him or her to most efficiently treat your condition. These exercises may help you when beginning to rehabilitate your injury. Your symptoms may resolve with or without further involvement from your physician, physical therapist or athletic trainer. While completing these exercises, remember:   Restoring tissue flexibility helps normal motion to return to the joints. This allows healthier, less painful movement and activity.  An effective stretch should be held for at least 30 seconds.  A stretch should never be painful. You should only feel a gentle lengthening or release in the stretched tissue. FLEXION RANGE OF MOTION AND STRETCHING EXERCISES:  STRETCH - Flexion, Single Knee to Chest   Lie on a firm bed or floor with both legs extended in front of you.  Keeping one leg in contact with the floor, bring your opposite knee to your chest. Hold your leg in place by either grabbing behind your thigh or at your knee.  Pull until you feel a gentle stretch in your low back. Hold 30 seconds.  Slowly release your  grasp and repeat the exercise with the opposite side. Repeat 2 times. Complete this exercise 3 times per week.   STRETCH - Flexion, Double Knee to Chest  Lie on a firm bed or floor with both legs extended in front of you.  Keeping one leg in contact with the floor, bring your opposite knee to your chest.  Tense your stomach muscles to support your back and then lift your other knee to your chest. Hold  your legs in place by either grabbing behind your thighs or at your knees.  Pull both knees toward your chest until you feel a gentle stretch in your low back. Hold 30 seconds.  Tense your stomach muscles and slowly return one leg at a time to the floor. Repeat 2 times. Complete this exercise 3 times per week.   STRETCH - Low Trunk Rotation  Lie on a firm bed or floor. Keeping your legs in front of you, bend your knees so they are both pointed toward the ceiling and your feet are flat on the floor.  Extend your arms out to the side. This will stabilize your upper body by keeping your shoulders in contact with the floor.  Gently and slowly drop both knees together to one side until you feel a gentle stretch in your low back. Hold for 30 seconds.  Tense your stomach muscles to support your lower back as you bring your knees back to the starting position. Repeat the exercise to the other side. Repeat 2 times. Complete this exercise at least 3 times per week.   EXTENSION RANGE OF MOTION AND FLEXIBILITY EXERCISES:  STRETCH - Extension, Prone on Elbows   Lie on your stomach on the floor, a bed will be too soft. Place your palms about shoulder width apart and at the height of your head.  Place your elbows under your shoulders. If this is too painful, stack pillows under your chest.  Allow your body to relax so that your hips drop lower and make contact more completely with the floor.  Hold this position for 30 seconds.  Slowly return to lying flat on the floor. Repeat 2 times. Complete this exercise 3 times per week.   RANGE OF MOTION - Extension, Prone Press Ups  Lie on your stomach on the floor, a bed will be too soft. Place your palms about shoulder width apart and at the height of your head.  Keeping your back as relaxed as possible, slowly straighten your elbows while keeping your hips on the floor. You may adjust the placement of your hands to maximize your comfort. As you gain  motion, your hands will come more underneath your shoulders.  Hold this position 30 seconds.  Slowly return to lying flat on the floor. Repeat 2 times. Complete this exercise 3 times per week.   RANGE OF MOTION- Quadruped, Neutral Spine   Assume a hands and knees position on a firm surface. Keep your hands under your shoulders and your knees under your hips. You may place padding under your knees for comfort.  Drop your head and point your tailbone toward the ground below you. This will round out your lower back like an angry cat. Hold this position for 30 seconds.  Slowly lift your head and release your tail bone so that your back sags into a large arch, like an old horse.  Hold this position for 30 seconds.  Repeat this until you feel limber in your low back.  Now, find your "sweet  spot." This will be the most comfortable position somewhere between the two previous positions. This is your neutral spine. Once you have found this position, tense your stomach muscles to support your low back.  Hold this position for 30 seconds. Repeat 2 times. Complete this exercise 3 times per week.   STRENGTHENING EXERCISES - Low Back Sprain These exercises may help you when beginning to rehabilitate your injury. These exercises should be done near your "sweet spot." This is the neutral, low-back arch, somewhere between fully rounded and fully arched, that is your least painful position. When performed in this safe range of motion, these exercises can be used for people who have either a flexion or extension based injury. These exercises may resolve your symptoms with or without further involvement from your physician, physical therapist or athletic trainer. While completing these exercises, remember:   Muscles can gain both the endurance and the strength needed for everyday activities through controlled exercises.  Complete these exercises as instructed by your physician, physical therapist or athletic  trainer. Increase the resistance and repetitions only as guided.  You may experience muscle soreness or fatigue, but the pain or discomfort you are trying to eliminate should never worsen during these exercises. If this pain does worsen, stop and make certain you are following the directions exactly. If the pain is still present after adjustments, discontinue the exercise until you can discuss the trouble with your caregiver.  STRENGTHENING - Deep Abdominals, Pelvic Tilt   Lie on a firm bed or floor. Keeping your legs in front of you, bend your knees so they are both pointed toward the ceiling and your feet are flat on the floor.  Tense your lower abdominal muscles to press your low back into the floor. This motion will rotate your pelvis so that your tail bone is scooping upwards rather than pointing at your feet or into the floor. With a gentle tension and even breathing, hold this position for 3 seconds. Repeat 2 times. Complete this exercise 3 times per week.   STRENGTHENING - Abdominals, Crunches   Lie on a firm bed or floor. Keeping your legs in front of you, bend your knees so they are both pointed toward the ceiling and your feet are flat on the floor. Cross your arms over your chest.  Slightly tip your chin down without bending your neck.  Tense your abdominals and slowly lift your trunk high enough to just clear your shoulder blades. Lifting higher can put excessive stress on the lower back and does not further strengthen your abdominal muscles.  Control your return to the starting position. Repeat 2 times. Complete this exercise 3 times per week.   STRENGTHENING - Quadruped, Opposite UE/LE Lift   Assume a hands and knees position on a firm surface. Keep your hands under your shoulders and your knees under your hips. You may place padding under your knees for comfort.  Find your neutral spine and gently tense your abdominal muscles so that you can maintain this position. Your  shoulders and hips should form a rectangle that is parallel with the floor and is not twisted.  Keeping your trunk steady, lift your right hand no higher than your shoulder and then your left leg no higher than your hip. Make sure you are not holding your breath. Hold this position for 30 seconds.  Continuing to keep your abdominal muscles tense and your back steady, slowly return to your starting position. Repeat with the opposite arm and leg.  Repeat 2 times. Complete this exercise 3 times per week.   STRENGTHENING - Abdominals and Quadriceps, Straight Leg Raise   Lie on a firm bed or floor with both legs extended in front of you.  Keeping one leg in contact with the floor, bend the other knee so that your foot can rest flat on the floor.  Find your neutral spine, and tense your abdominal muscles to maintain your spinal position throughout the exercise.  Slowly lift your straight leg off the floor about 6 inches for a count of 3, making sure to not hold your breath.  Still keeping your neutral spine, slowly lower your leg all the way to the floor. Repeat this exercise with each leg 2 times. Complete this exercise 3 times per week.  POSTURE AND BODY MECHANICS CONSIDERATIONS - Low Back Sprain Keeping correct posture when sitting, standing or completing your activities will reduce the stress put on different body tissues, allowing injured tissues a chance to heal and limiting painful experiences. The following are general guidelines for improved posture.  While reading these guidelines, remember:  The exercises prescribed by your provider will help you have the flexibility and strength to maintain correct postures.  The correct posture provides the best environment for your joints to work. All of your joints have less wear and tear when properly supported by a spine with good posture. This means you will experience a healthier, less painful body.  Correct posture must be practiced with all  of your activities, especially prolonged sitting and standing. Correct posture is as important when doing repetitive low-stress activities (typing) as it is when doing a single heavy-load activity (lifting).  RESTING POSITIONS Consider which positions are most painful for you when choosing a resting position. If you have pain with flexion-based activities (sitting, bending, stooping, squatting), choose a position that allows you to rest in a less flexed posture. You would want to avoid curling into a fetal position on your side. If your pain worsens with extension-based activities (prolonged standing, working overhead), avoid resting in an extended position such as sleeping on your stomach. Most people will find more comfort when they rest with their spine in a more neutral position, neither too rounded nor too arched. Lying on a non-sagging bed on your side with a pillow between your knees, or on your back with a pillow under your knees will often provide some relief. Keep in mind, being in any one position for a prolonged period of time, no matter how correct your posture, can still lead to stiffness.  PROPER SITTING POSTURE In order to minimize stress and discomfort on your spine, you must sit with correct posture. Sitting with good posture should be effortless for a healthy body. Returning to good posture is a gradual process. Many people can work toward this most comfortably by using various supports until they have the flexibility and strength to maintain this posture on their own. When sitting with proper posture, your ears will fall over your shoulders and your shoulders will fall over your hips. You should use the back of the chair to support your upper back. Your lower back will be in a neutral position, just slightly arched. You may place a small pillow or folded towel at the base of your lower back for  support.  When working at a desk, create an environment that supports good, upright posture.  Without extra support, muscles tire, which leads to excessive strain on joints and other tissues. Keep these recommendations  in mind:  CHAIR:  A chair should be able to slide under your desk when your back makes contact with the back of the chair. This allows you to work closely.  The chair's height should allow your eyes to be level with the upper part of your monitor and your hands to be slightly lower than your elbows.  BODY POSITION  Your feet should make contact with the floor. If this is not possible, use a foot rest.  Keep your ears over your shoulders. This will reduce stress on your neck and low back.  INCORRECT SITTING POSTURES  If you are feeling tired and unable to assume a healthy sitting posture, do not slouch or slump. This puts excessive strain on your back tissues, causing more damage and pain. Healthier options include:  Using more support, like a lumbar pillow.  Switching tasks to something that requires you to be upright or walking.  Talking a brief walk.  Lying down to rest in a neutral-spine position.  PROLONGED STANDING WHILE SLIGHTLY LEANING FORWARD  When completing a task that requires you to lean forward while standing in one place for a long time, place either foot up on a stationary 2-4 inch high object to help maintain the best posture. When both feet are on the ground, the lower back tends to lose its slight inward curve. If this curve flattens (or becomes too large), then the back and your other joints will experience too much stress, tire more quickly, and can cause pain.  CORRECT STANDING POSTURES Proper standing posture should be assumed with all daily activities, even if they only take a few moments, like when brushing your teeth. As in sitting, your ears should fall over your shoulders and your shoulders should fall over your hips. You should keep a slight tension in your abdominal muscles to brace your spine. Your tailbone should point down to the  ground, not behind your body, resulting in an over-extended swayback posture.   INCORRECT STANDING POSTURES  Common incorrect standing postures include a forward head, locked knees and/or an excessive swayback. WALKING Walk with an upright posture. Your ears, shoulders and hips should all line-up.  PROLONGED ACTIVITY IN A FLEXED POSITION When completing a task that requires you to bend forward at your waist or lean over a low surface, try to find a way to stabilize 3 out of 4 of your limbs. You can place a hand or elbow on your thigh or rest a knee on the surface you are reaching across. This will provide you more stability, so that your muscles do not tire as quickly. By keeping your knees relaxed, or slightly bent, you will also reduce stress across your lower back. CORRECT LIFTING TECHNIQUES  DO :  Assume a wide stance. This will provide you more stability and the opportunity to get as close as possible to the object which you are lifting.  Tense your abdominals to brace your spine. Bend at the knees and hips. Keeping your back locked in a neutral-spine position, lift using your leg muscles. Lift with your legs, keeping your back straight.  Test the weight of unknown objects before attempting to lift them.  Try to keep your elbows locked down at your sides in order get the best strength from your shoulders when carrying an object.     Always ask for help when lifting heavy or awkward objects. INCORRECT LIFTING TECHNIQUES DO NOT:   Lock your knees when lifting, even if it is a  small object.  Bend and twist. Pivot at your feet or move your feet when needing to change directions.  Assume that you can safely pick up even a paperclip without proper posture.

## 2018-08-06 NOTE — Progress Notes (Signed)
Musculoskeletal Exam  Patient: Lindsey Owen DOB: [birth year]  DOS: 08/06/2018  SUBJECTIVE:  Chief Complaint:   Chief Complaint  Patient presents with  . Shoulder Pain    Lindsey Owen is a 52 y.o.  female for evaluation and treatment of shoulder pain.   Onset:  3 weeks ago. Has been driving a lot due to recent family in town and a wedding.  Location: b/l trap Character:  burning and sharp  Progression of issue:  is unchanged Associated symptoms: none Treatment: to date has been OTC NSAIDS and acetaminophen.   Neurovascular symptoms: no  ROS: Musculoskeletal/Extremities: +b/l shoulder pain  Past Medical History:  Diagnosis Date  . Diabetes mellitus without complication (HCC)   . Hypertension   . Thyroid disease     Objective: VITAL SIGNS: BP 130/86 (BP Location: Left Arm, Patient Position: Sitting, Cuff Size: Normal)   Pulse 66   Temp 98.3 F (36.8 C) (Oral)   Ht 5\' 1"  (1.549 m)   Wt 188 lb 2 oz (85.3 kg)   SpO2 98%   BMI 35.55 kg/m  Constitutional: Well formed, well developed. No acute distress. Thorax & Lungs: No accessory muscle use Musculoskeletal: shoulders.   Normal active range of motion: yes.   Normal passive range of motion: yes Tenderness to palpation: yes, b/l traps and lumbar parasp msc b/l Deformity: no Ecchymosis: no Tests positive: none Tests negative: Neer's, Hawkins, speed's, cross over, lift off, straight leg Neurologic: Normal sensory function. No focal deficits noted. DTR's equal and symmetry in UE/LE's. No clonus. Psychiatric: Normal mood. Age appropriate judgment and insight. Alert & oriented x 3.    Assessment:  Strain of trapezius muscle, unspecified laterality, initial encounter - Plan: methylPREDNISolone acetate (DEPO-MEDROL) injection 40 mg  Acute bilateral low back pain without sciatica - Plan: methylPREDNISolone acetate (DEPO-MEDROL) injection 40 mg  Controlled type 2 diabetes mellitus without complication, without long-term  current use of insulin (HCC) - Plan: metFORMIN (GLUCOPHAGE) 500 MG tablet, DISCONTINUED: metFORMIN (GLUCOPHAGE) 500 MG tablet  Essential hypertension - Plan: lisinopril-hydrochlorothiazide (PRINZIDE,ZESTORETIC) 20-12.5 MG tablet, DISCONTINUED: lisinopril-hydrochlorothiazide (PRINZIDE,ZESTORETIC) 20-12.5 MG tablet  Plan: Orders as above. Inj today, start Mobic and muscle relaxer, stretches and exercises for both trap and lumbar provided. F/u in around 1 mo. The patient voiced understanding and agreement to the plan.   Jilda Roche [place], DO 08/06/18  1:24 PM

## 2018-08-09 ENCOUNTER — Ambulatory Visit: Payer: BLUE CROSS/BLUE SHIELD | Admitting: Family Medicine

## 2018-08-12 ENCOUNTER — Ambulatory Visit (INDEPENDENT_AMBULATORY_CARE_PROVIDER_SITE_OTHER): Payer: BLUE CROSS/BLUE SHIELD | Admitting: Orthopedic Surgery

## 2018-08-27 DIAGNOSIS — Z981 Arthrodesis status: Secondary | ICD-10-CM | POA: Diagnosis not present

## 2018-09-03 ENCOUNTER — Ambulatory Visit (INDEPENDENT_AMBULATORY_CARE_PROVIDER_SITE_OTHER): Payer: BLUE CROSS/BLUE SHIELD | Admitting: Family Medicine

## 2018-09-03 ENCOUNTER — Encounter: Payer: Self-pay | Admitting: Family Medicine

## 2018-09-03 VITALS — BP 120/80 | HR 73 | Temp 98.0°F | Ht 61.0 in | Wt 189.1 lb

## 2018-09-03 DIAGNOSIS — J0100 Acute maxillary sinusitis, unspecified: Secondary | ICD-10-CM | POA: Diagnosis not present

## 2018-09-03 DIAGNOSIS — E119 Type 2 diabetes mellitus without complications: Secondary | ICD-10-CM

## 2018-09-03 MED ORDER — AMOXICILLIN-POT CLAVULANATE 875-125 MG PO TABS: 1.0000 | ORAL_TABLET | Freq: Two times a day (BID) | ORAL | 0 refills | Status: DC

## 2018-09-03 MED ORDER — METHYLPREDNISOLONE ACETATE 80 MG/ML IJ SUSP
80.0000 mg | Freq: Once | INTRAMUSCULAR | Status: AC
  Administered 2018-09-03: 80 mg via INTRAMUSCULAR

## 2018-09-03 MED ORDER — PREGABALIN 75 MG PO CAPS
75.0000 mg | ORAL_CAPSULE | Freq: Two times a day (BID) | ORAL | 2 refills | Status: DC
Start: 2018-09-03 — End: 2018-10-04

## 2018-09-03 MED ORDER — BENZONATATE 100 MG PO CAPS: 100.0000 mg | ORAL_CAPSULE | Freq: Three times a day (TID) | ORAL | 0 refills | Status: DC | PRN

## 2018-09-03 NOTE — Progress Notes (Signed)
Chief Complaint  Patient presents with  . Cough    congestion    Lindsey Owen here for URI complaints.  Duration: 12 days  Associated symptoms: sinus congestion, sinus pain, rhinorrhea, itchy watery eyes, ear fullness, sore throat, myalgia and cough Denies: ear pain, ear drainage, wheezing, shortness of breath Treatment to date: Theraflu, Mucinex, Tylenol cold and flu Sick contacts: Yes  ROS:  Const: +chills HEENT: As noted in HPI Lungs: No SOB  Past Medical History:  Diagnosis Date  . Diabetes mellitus without complication (HCC)   . Hypertension   . Thyroid disease     BP 120/80 (BP Location: Left Arm, Patient Position: Sitting, Cuff Size: Normal)   Pulse 73   Temp 98 F (36.7 C) (Oral)   Ht 5\' 1"  (1.549 m)   Wt 189 lb 2 oz (85.8 kg)   SpO2 98%   BMI 35.73 kg/m  General: Awake, alert, appears stated age HEENT: AT, [place], ears patent b/l and TM's neg, nares patent w/o discharge, +ttp over max and frontal sinuses b/l, pharynx pink and without exudates, MMM Neck: No masses or asymmetry Heart: RRR Lungs: CTAB, no accessory muscle use Psych: Age appropriate judgment and insight, normal mood and affect  Acute maxillary sinusitis, recurrence not specified - Plan: amoxicillin-clavulanate (AUGMENTIN) 875-125 MG tablet, benzonatate (TESSALON) 100 MG capsule  Controlled type 2 diabetes mellitus without complication, without long-term current use of insulin (HCC) - Plan: pregabalin (LYRICA) 75 MG capsule  Orders as above. Steroid shot today. Continue to push fluids, practice good hand hygiene, cover mouth when coughing. F/u in 1 week. If starting to experience fevers, shaking, or shortness of breath, seek immediate care. Pt voiced understanding and agreement to the plan.  Jilda Rocheicholas Paul [place], DO 09/03/18 8:47 AM

## 2018-09-03 NOTE — Patient Instructions (Signed)
Continue to push fluids, practice good hand hygiene, and cover your mouth if you cough.  If you start having fevers, shaking or shortness of breath, seek immediate care.  Let us know if you need anything.  

## 2018-09-03 NOTE — Progress Notes (Signed)
Pre visit review using our clinic review tool, if applicable. No additional management support is needed unless otherwise documented below in the visit note. 

## 2018-09-03 NOTE — Addendum Note (Signed)
Addended by: Scharlene GlossEWING, Roseann Kees B on: 09/03/2018 08:55 AM   Modules accepted: Orders

## 2018-09-10 ENCOUNTER — Ambulatory Visit (INDEPENDENT_AMBULATORY_CARE_PROVIDER_SITE_OTHER): Payer: BLUE CROSS/BLUE SHIELD | Admitting: Family Medicine

## 2018-09-10 ENCOUNTER — Encounter: Payer: Self-pay | Admitting: Family Medicine

## 2018-09-10 ENCOUNTER — Telehealth: Payer: Self-pay | Admitting: Family Medicine

## 2018-09-10 VITALS — BP 122/78 | HR 66 | Temp 98.1°F | Ht 61.0 in | Wt 185.2 lb

## 2018-09-10 DIAGNOSIS — E1165 Type 2 diabetes mellitus with hyperglycemia: Secondary | ICD-10-CM | POA: Insufficient documentation

## 2018-09-10 DIAGNOSIS — E1169 Type 2 diabetes mellitus with other specified complication: Secondary | ICD-10-CM | POA: Diagnosis not present

## 2018-09-10 DIAGNOSIS — I10 Essential (primary) hypertension: Secondary | ICD-10-CM | POA: Diagnosis not present

## 2018-09-10 DIAGNOSIS — R0683 Snoring: Secondary | ICD-10-CM | POA: Diagnosis not present

## 2018-09-10 DIAGNOSIS — H938X3 Other specified disorders of ear, bilateral: Secondary | ICD-10-CM

## 2018-09-10 DIAGNOSIS — R5383 Other fatigue: Secondary | ICD-10-CM | POA: Diagnosis not present

## 2018-09-10 DIAGNOSIS — E669 Obesity, unspecified: Secondary | ICD-10-CM | POA: Diagnosis not present

## 2018-09-10 LAB — COMPREHENSIVE METABOLIC PANEL
ALT: 29 U/L (ref 0–35)
AST: 31 U/L (ref 0–37)
Albumin: 4.1 g/dL (ref 3.5–5.2)
Alkaline Phosphatase: 61 U/L (ref 39–117)
BUN: 6 mg/dL (ref 6–23)
CO2: 35 mEq/L — ABNORMAL HIGH (ref 19–32)
Calcium: 9.8 mg/dL (ref 8.4–10.5)
Chloride: 98 mEq/L (ref 96–112)
Creatinine, Ser: 0.66 mg/dL (ref 0.40–1.20)
GFR: 98.96 mL/min (ref >60.00)
Glucose, Bld: 153 mg/dL — ABNORMAL HIGH (ref 70–99)
Potassium: 4.9 mEq/L (ref 3.5–5.1)
Sodium: 140 mEq/L (ref 135–145)
Total Bilirubin: 0.6 mg/dL (ref 0.2–1.2)
Total Protein: 7.7 g/dL (ref 6.0–8.3)

## 2018-09-10 LAB — LIPID PANEL
Cholesterol: 173 mg/dL (ref 0–200)
HDL: 52.1 mg/dL (ref >39.00)
LDL Cholesterol: 101 mg/dL — ABNORMAL HIGH (ref 0–99)
NonHDL: 120.78
Total CHOL/HDL Ratio: 3
Triglycerides: 101 mg/dL (ref 0.0–149.0)
VLDL: 20.2 mg/dL (ref 0.0–40.0)

## 2018-09-10 LAB — HEMOGLOBIN A1C: Hgb A1c MFr Bld: 7.3 % — ABNORMAL HIGH (ref 4.6–6.5)

## 2018-09-10 LAB — CBC
HCT: 41.6 % (ref 36.0–46.0)
Hemoglobin: 14 g/dL (ref 12.0–15.0)
MCHC: 33.6 g/dL (ref 30.0–36.0)
MCV: 87.8 fl (ref 78.0–100.0)
Platelets: 278 10*3/uL (ref 150.0–400.0)
RBC: 4.74 Mil/uL (ref 3.87–5.11)
RDW: 14.1 % (ref 11.5–15.5)
WBC: 7.4 10*3/uL (ref 4.0–10.5)

## 2018-09-10 LAB — MICROALBUMIN / CREATININE URINE RATIO
Creatinine,U: 45.9 mg/dL
Microalb Creat Ratio: 1.5 mg/g (ref 0.0–30.0)
Microalb, Ur: <0.7 mg/dL (ref 0.0–1.9)

## 2018-09-10 LAB — TSH: TSH: 2.19 u[IU]/mL (ref 0.35–4.50)

## 2018-09-10 LAB — T4, FREE: Free T4: 0.98 ng/dL (ref 0.60–1.60)

## 2018-09-10 NOTE — Patient Instructions (Addendum)
Give us 2-3 business days to get the results of your labs back.   Keep the diet clean and stay active.  Aim to do some physical exertion for 150 minutes per week. This is typically divided into 5 days per week, 30 minutes per day. The activity should be enough to get your heart rate up. Anything is better than nothing if you have time constraints.  If you do not hear anything about your referral in the next 1-2 weeks, call our office and ask for an update.  Claritin (loratadine), Allegra (fexofenadine), Zyrtec (cetirizine); these are listed in order from weakest to strongest. Generic, and therefore cheaper, options are in the parentheses.   Flonase (fluticasone); nasal spray that is over the counter. 2 sprays each nostril, once daily. Aim towards the same side eye when you spray.  There are available OTC, and the generic versions, which may be cheaper, are in parentheses. Show this to a pharmacist if you have trouble finding any of these items.   Let us know if you need anything.

## 2018-09-10 NOTE — Telephone Encounter (Signed)
Patients medication was too expensive at the pharmacy Advise please.

## 2018-09-10 NOTE — Progress Notes (Signed)
Pre visit review using our clinic review tool, if applicable. No additional management support is needed unless otherwise documented below in the visit note. 

## 2018-09-10 NOTE — Progress Notes (Signed)
Subjective:   Chief Complaint  Patient presents with  . Follow-up    Lindsey Owen is a 52 y.o. female here for follow-up of diabetes.   Masiyah's self monitored glucose range is mid-100's. Patient denies hypoglycemic reactions. Patient does not require insulin.   Medications include: Metformin 1000 mg bid, Actos 30 mg/d; not always compliant Exercise: Active at work, no scheduled exercise Diet: Healthy overall  Hypertension Patient presents for hypertension follow up. She does not routinely monitor home blood pressures. She is compliant with medications. Patient has these side effects of medication: none She is adhering to a healthy diet overall. Exercise: none  Fatigue for past 3 mo. No changes in PO intake. Does not exercise. Anhedonia. Mood is normal overall. She does snore at night. Sometimes wakes up feeling as if she has not gone to sleep at all. No apneic episodes or waking up gasping for air.  Past Medical History:  Diagnosis Date  . Diabetes mellitus without complication (HCC)   . Hypertension   . Thyroid disease      Related testing: Date of retinal exam: Due Pneumovax: done Flu Shot: refuses  Review of Systems: Pulmonary:  No SOB Cardiovascular:  No chest pain  Objective:  BP 122/78 (BP Location: Left Arm, Patient Position: Sitting, Cuff Size: Normal)   Pulse 66   Temp 98.1 F (36.7 C) (Oral)   Ht 5\' 1"  (1.549 m)   Wt 185 lb 4 oz (84 kg)   SpO2 98%   BMI 35.00 kg/m  General:  Well developed, well nourished, in no apparent distress Skin:  Warm, no pallor or diaphoresis Head:  Normocephalic, atraumatic Eyes:  Pupils equal and round, sclera anicteric without injection  Lungs:  CTAB, no access msc use Cardio:  RRR, no bruits, no LE edema Musculoskeletal:  Symmetrical muscle groups noted without atrophy or deformity Neuro:  Sensation intact to pinprick on feet Psych: Age appropriate judgment and insight  Assessment:   Diabetes mellitus type 2 in  obese (HCC) - Plan: Hemoglobin A1c, Microalbumin / creatinine urine ratio, Lipid panel, Comprehensive metabolic panel, CBC, TSH, T4, free, Ambulatory referral to Ophthalmology, HM DIABETES FOOT EXAM  Essential hypertension  Fatigue, unspecified type - Plan: Ambulatory referral to Pulmonology  Snoring - Plan: Ambulatory referral to Pulmonology  Sensation of fullness in both ears   Plan:   Orders as above. Counseled on diet and exercise.  We will see what A1c is.  Continue current medications for now.  May need to add GLP-1. Continue current medication, no longer needs to check blood pressures at home. She may have sleep apnea, will have her set up with the sleep specialty team. We will also check thyroid levels. Intranasal corticosteroid on a daily basis. F/u in 1 mo to recheck fatigue and ear issue. The patient voiced understanding and agreement to the plan.  Jilda Rocheicholas Paul [place], DO 09/10/18 4:03 PM

## 2018-09-13 ENCOUNTER — Other Ambulatory Visit: Payer: Self-pay | Admitting: Family Medicine

## 2018-09-13 DIAGNOSIS — E119 Type 2 diabetes mellitus without complications: Secondary | ICD-10-CM

## 2018-09-13 MED ORDER — METFORMIN HCL 500 MG PO TABS: 500.0000 mg | ORAL_TABLET | Freq: Two times a day (BID) | ORAL | 1 refills | Status: DC

## 2018-09-13 MED ORDER — PIOGLITAZONE HCL 30 MG PO TABS: 30.0000 mg | ORAL_TABLET | Freq: Every day | ORAL | 2 refills | Status: DC

## 2018-10-04 ENCOUNTER — Telehealth: Payer: Self-pay | Admitting: Family Medicine

## 2018-10-04 DIAGNOSIS — E119 Type 2 diabetes mellitus without complications: Secondary | ICD-10-CM

## 2018-10-04 MED ORDER — PREGABALIN 75 MG PO CAPS: ORAL_CAPSULE | ORAL | 2 refills | Status: DC

## 2018-10-04 NOTE — Telephone Encounter (Signed)
Patient informed. 

## 2018-10-04 NOTE — Telephone Encounter (Signed)
Patient called and asked about the request to increase Lyrica. She says that she has started taking 150 mg in the morning and 75 mg at night to help with the pain and it is not helping. It's ordered 75 mg BID. She is asking if Dr. Carmelia RollerWendling will prescribe the higher dose. I advised I will send to Dr. Carmelia RollerWendling and someone will call with his recommendation.

## 2018-10-04 NOTE — Telephone Encounter (Signed)
OK. rx resent w new quant. TY.

## 2018-10-04 NOTE — Telephone Encounter (Signed)
Copied from CRM [phone]. Topic: Quick Communication - See Telephone Encounter >> Oct 04, 2018  1:29 PM Waymon AmatoBurton, Donna F wrote: Pt is calling to see if dr wendling will increase the dosage on her lyrica  Best number [phone]

## 2018-10-07 ENCOUNTER — Institutional Professional Consult (permissible substitution): Payer: BLUE CROSS/BLUE SHIELD | Admitting: Pulmonary Disease

## 2018-10-11 ENCOUNTER — Ambulatory Visit: Payer: BLUE CROSS/BLUE SHIELD | Admitting: Family Medicine

## 2018-10-18 DIAGNOSIS — E119 Type 2 diabetes mellitus without complications: Secondary | ICD-10-CM | POA: Diagnosis not present

## 2018-10-18 LAB — HM DIABETES EYE EXAM

## 2018-10-21 ENCOUNTER — Telehealth: Payer: Self-pay | Admitting: *Deleted

## 2018-10-21 NOTE — Telephone Encounter (Signed)
Received Diabetic Eye Exam Report from [facility]; forwarded to provider/SLS 01/02  

## 2018-10-22 ENCOUNTER — Encounter: Payer: Self-pay | Admitting: Family Medicine

## 2018-11-15 ENCOUNTER — Other Ambulatory Visit: Payer: Self-pay | Admitting: Family Medicine

## 2018-11-15 DIAGNOSIS — I10 Essential (primary) hypertension: Secondary | ICD-10-CM

## 2019-02-18 ENCOUNTER — Other Ambulatory Visit: Payer: Self-pay

## 2019-02-18 ENCOUNTER — Encounter: Payer: Self-pay | Admitting: Family Medicine

## 2019-02-18 ENCOUNTER — Ambulatory Visit (INDEPENDENT_AMBULATORY_CARE_PROVIDER_SITE_OTHER): Payer: BLUE CROSS/BLUE SHIELD | Admitting: Family Medicine

## 2019-02-18 DIAGNOSIS — E1169 Type 2 diabetes mellitus with other specified complication: Secondary | ICD-10-CM | POA: Diagnosis not present

## 2019-02-18 DIAGNOSIS — E038 Other specified hypothyroidism: Secondary | ICD-10-CM

## 2019-02-18 DIAGNOSIS — E669 Obesity, unspecified: Secondary | ICD-10-CM | POA: Diagnosis not present

## 2019-02-18 MED ORDER — METFORMIN HCL 500 MG PO TABS: 500.0000 mg | ORAL_TABLET | Freq: Two times a day (BID) | ORAL | 1 refills | Status: DC

## 2019-02-18 MED ORDER — PIOGLITAZONE HCL 30 MG PO TABS: 30.0000 mg | ORAL_TABLET | Freq: Every day | ORAL | 2 refills | Status: DC

## 2019-02-18 NOTE — Addendum Note (Signed)
Addended by: Mervin Kung A on: 02/18/2019 03:08 PM   Modules accepted: Orders

## 2019-02-18 NOTE — Addendum Note (Signed)
Addended by: Mervin Kung A on: 02/18/2019 03:07 PM   Modules accepted: Orders

## 2019-02-18 NOTE — Progress Notes (Signed)
Subjective:   Chief Complaint  Patient presents with  . Follow-up    Lindsey Owen is a 53 y.o. female here for follow-up of diabetes. Due to COVID-19 pandemic, we are interacting via web portal for an electronic face-to-face visit. I verified patient's ID using 2 identifiers. Patient agreed to proceed with visit via this method. Patient is at home, I am at office. Patient and I are present for visit.   Lindsey Owen's self monitored glucose range is 200's. Patient denies hypoglycemic reactions. She checks her glucose levels intermittently. Patient does not require insulin.   Medications include: Actos 30 mg/d, Metformin 500 mg bid (did not do well with 1000 mg dose). Exercise: None  Hypothyroidism Patient presents for follow-up of hypothyroidism.  Reports compliance with medication- Synthroid 125 mcg/d Current symptoms include: fatigue Denies: denies weight changes, heat/cold intolerance, bowel/skin changes or CVS symptoms She believes her dose should be increased   Past Medical History:  Diagnosis Date  . Diabetes mellitus without complication (HCC)   . Hypertension   . Thyroid disease      Related testing: Date of retinal exam: Done Pneumovax: done Flu Shot: done  Review of Systems: Pulmonary:  No SOB Cardiovascular:  No chest pain  Objective:  No conversational dyspnea Age appropriate judgment and insight Nml affect and mood  Assessment:   Diabetes mellitus type 2 in obese (HCC) - Plan: metFORMIN (GLUCOPHAGE) 500 MG tablet, Hemoglobin A1c  Other specified hypothyroidism - Plan: T4, free, TSH   Plan:   1. Uncontrolled. Counseled on diet and exercise. Cont meds, ck A1c.  2. Ck labs, may need to adjust meds.  F/u in 3 mo. The patient voiced understanding and agreement to the plan.  Jilda Roche [place], DO 02/18/19 2:01 PM

## 2019-02-19 LAB — T4, FREE: Free T4: 1.2 ng/dL (ref 0.8–1.8)

## 2019-02-19 LAB — HEMOGLOBIN A1C
Hgb A1c MFr Bld: 9.6 % of total Hgb — ABNORMAL HIGH (ref <5.7)
Mean Plasma Glucose: 229 (calc)
eAG (mmol/L): 12.7 (calc)

## 2019-02-19 LAB — TSH: TSH: 4.9 mIU/L — ABNORMAL HIGH

## 2019-02-20 ENCOUNTER — Other Ambulatory Visit: Payer: Self-pay | Admitting: Family Medicine

## 2019-02-20 MED ORDER — PIOGLITAZONE HCL 45 MG PO TABS: 45.0000 mg | ORAL_TABLET | Freq: Every day | ORAL | 3 refills | Status: DC

## 2019-02-20 NOTE — Progress Notes (Signed)
Increase Actos dosage.

## 2019-02-21 ENCOUNTER — Other Ambulatory Visit: Payer: Self-pay | Admitting: Family Medicine

## 2019-02-21 DIAGNOSIS — E119 Type 2 diabetes mellitus without complications: Secondary | ICD-10-CM

## 2019-02-21 MED ORDER — PREGABALIN 75 MG PO CAPS: ORAL_CAPSULE | ORAL | 5 refills | Status: DC

## 2019-03-07 ENCOUNTER — Ambulatory Visit (INDEPENDENT_AMBULATORY_CARE_PROVIDER_SITE_OTHER): Payer: BLUE CROSS/BLUE SHIELD | Admitting: Family Medicine

## 2019-03-07 ENCOUNTER — Other Ambulatory Visit: Payer: Self-pay

## 2019-03-07 ENCOUNTER — Encounter: Payer: Self-pay | Admitting: Family Medicine

## 2019-03-07 DIAGNOSIS — E1169 Type 2 diabetes mellitus with other specified complication: Secondary | ICD-10-CM | POA: Diagnosis not present

## 2019-03-07 DIAGNOSIS — E669 Obesity, unspecified: Secondary | ICD-10-CM | POA: Diagnosis not present

## 2019-03-07 DIAGNOSIS — Z1239 Encounter for other screening for malignant neoplasm of breast: Secondary | ICD-10-CM

## 2019-03-07 NOTE — Patient Instructions (Signed)
Call [facility] [place] at [phone] for an appointment.  They are located at [address], [address], [place], , [address] (right across the hall from our office).  Let us know if you need anything.  

## 2019-03-07 NOTE — Progress Notes (Signed)
Subjective:   Chief Complaint  Patient presents with  . Results    discuss lab results    Lindsey Owen is a 53 y.o. female here for follow-up of diabetes. Due to COVID-19 pandemic, we are interacting via telephone. I verified patient's ID using 2 identifiers. Patient agreed to proceed with visit via this method. Patient is at home, I am at office. Patient and I are present for visit.   Recently had A1c 9.6. Lindsey Owen's self monitored glucose range is low 100's.  Patient denies hypoglycemic reactions. She checks her glucose levels 1 times per day. Patient does not require insulin.   Medications include: Metformin 500 mg bid, Actos 45 mg/d Exercise: none Diet: improved Sugars much better after increasing Actos to 45 mg/d from 30 mg/d and cleaning up diet.   Past Medical History:  Diagnosis Date  . Diabetes mellitus without complication (HCC)   . Hypertension   . Thyroid disease      Related testing: Date of retinal exam: Done Pneumovax: done Flu Shot: done  Review of Systems: Pulmonary:  No SOB Cardiovascular:  No chest pain  Objective:  No conversational dyspnea Age appropriate judgment and insight Nml affect and mood  Assessment:   Diabetes mellitus type 2 in obese (HCC)  Screening for malignant neoplasm of breast - Plan: MM DIGITAL SCREENING BILATERAL   Plan:   Cont current meds. Counseled on diet and exercise. Total time spent: 6 min F/u in 3 mo. The patient voiced understanding and agreement to the plan.  Jilda Roche [place], DO 03/07/19 3:42 PM

## 2019-03-25 ENCOUNTER — Other Ambulatory Visit: Payer: Self-pay

## 2019-03-25 ENCOUNTER — Ambulatory Visit (HOSPITAL_BASED_OUTPATIENT_CLINIC_OR_DEPARTMENT_OTHER)
Admission: RE | Admit: 2019-03-25 | Discharge: 2019-03-25 | Disposition: A | Discharge status: Home / Self Care | Payer: BC Managed Care – PPO | Source: Ambulatory Visit | Attending: Family Medicine | Admitting: Family Medicine

## 2019-03-25 DIAGNOSIS — Z1231 Encounter for screening mammogram for malignant neoplasm of breast: Secondary | ICD-10-CM | POA: Insufficient documentation

## 2019-03-25 DIAGNOSIS — Z1239 Encounter for other screening for malignant neoplasm of breast: Secondary | ICD-10-CM

## 2019-03-25 IMAGING — MG DIGITAL SCREENING BILATERAL MAMMOGRAM WITH TOMO AND CAD
6 of 10 series · 6 of 30 positions shown · non-contrast
Comparison: Previous exam(s).

CLINICAL DATA: Screening.

EXAM:
DIGITAL SCREENING BILATERAL MAMMOGRAM WITH TOMO AND CAD

[L CC synth-2D]
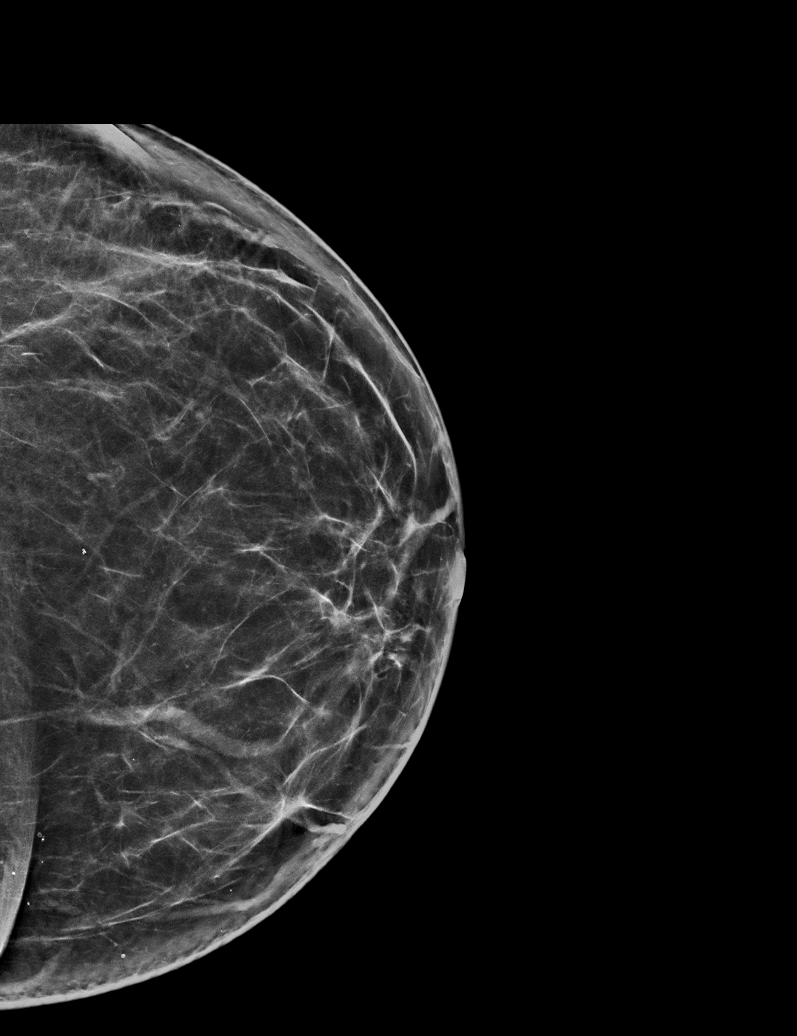

[R MLO synth-2D]
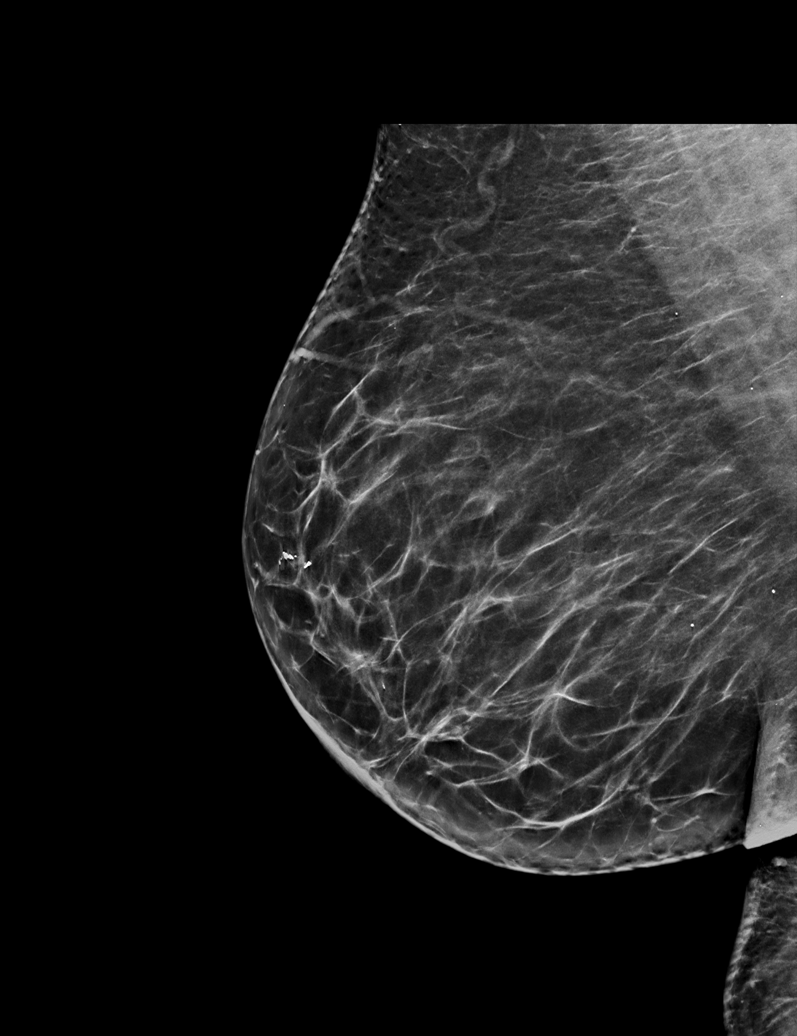

[R CC synth-2D]
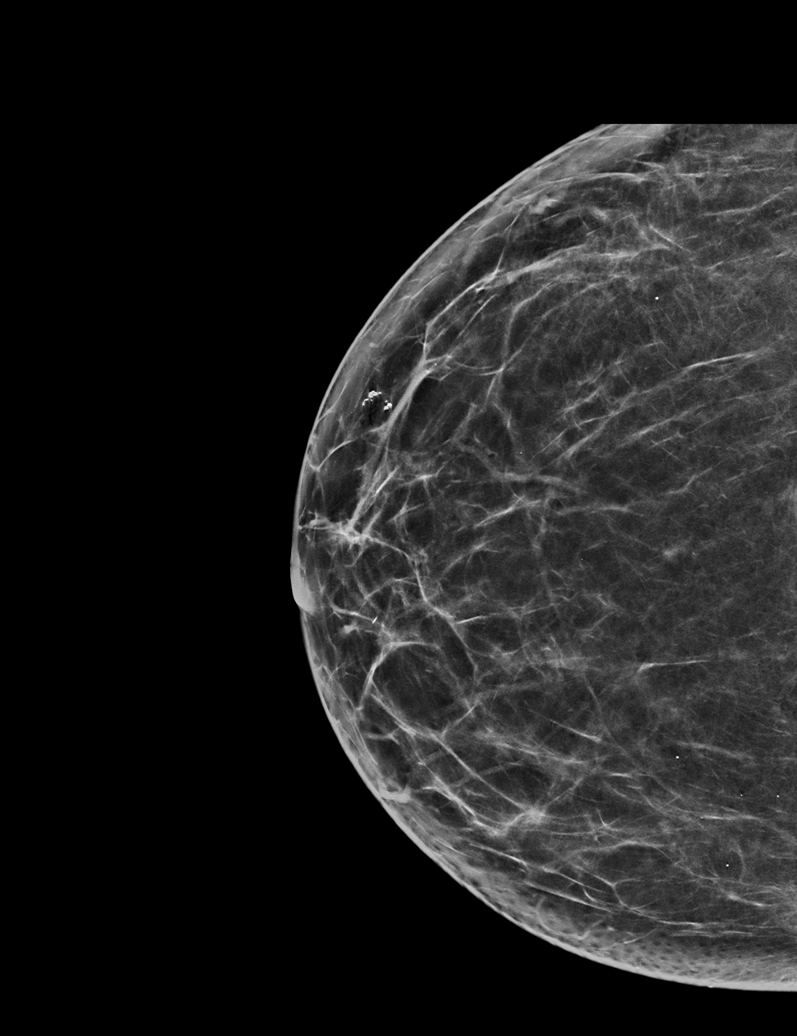

[L CV synth-2D]
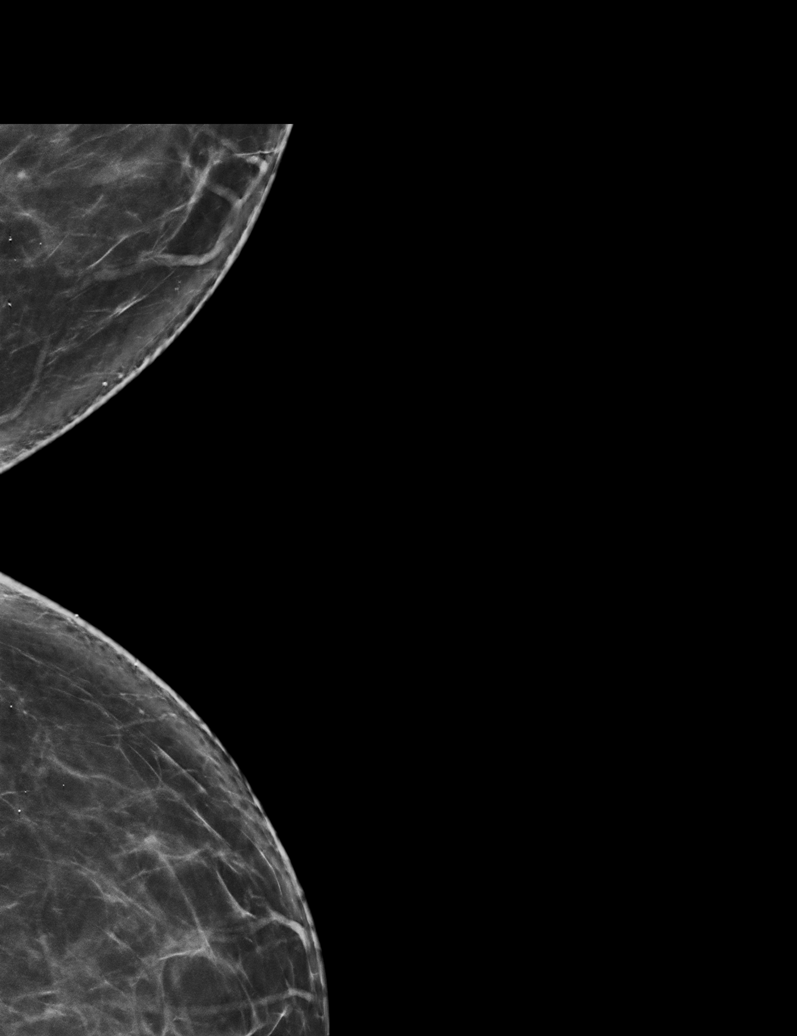

[L MLO synth-2D]
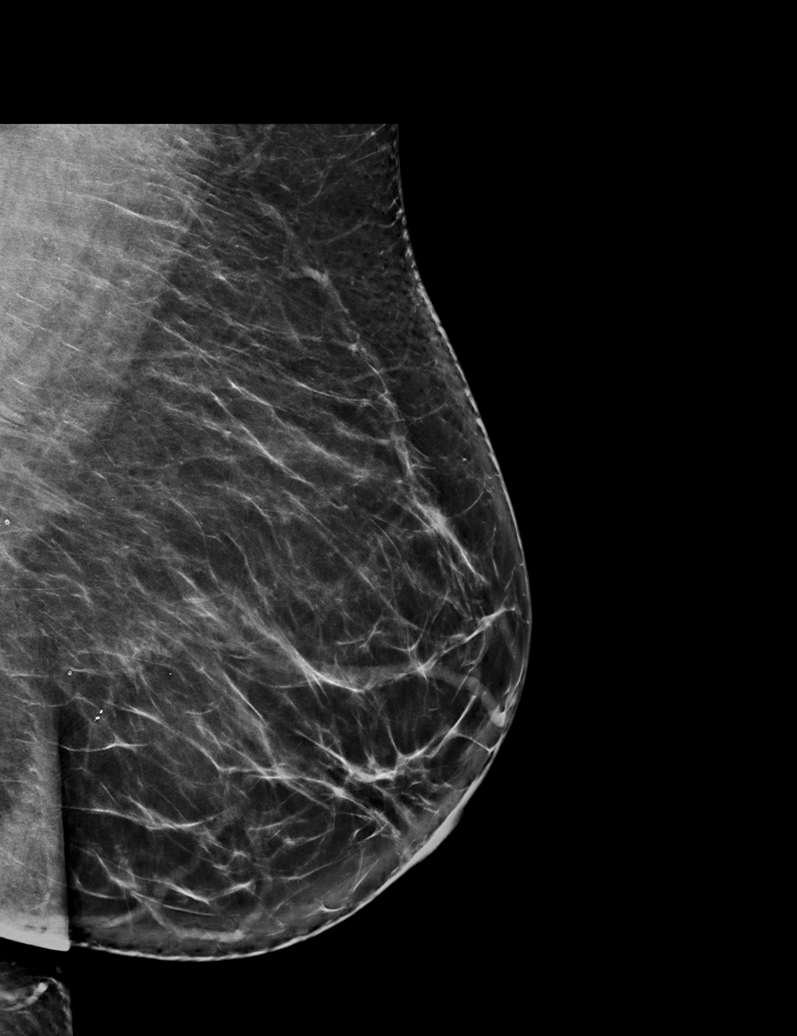

[L CC tomo · tomo slice 36/71.0]
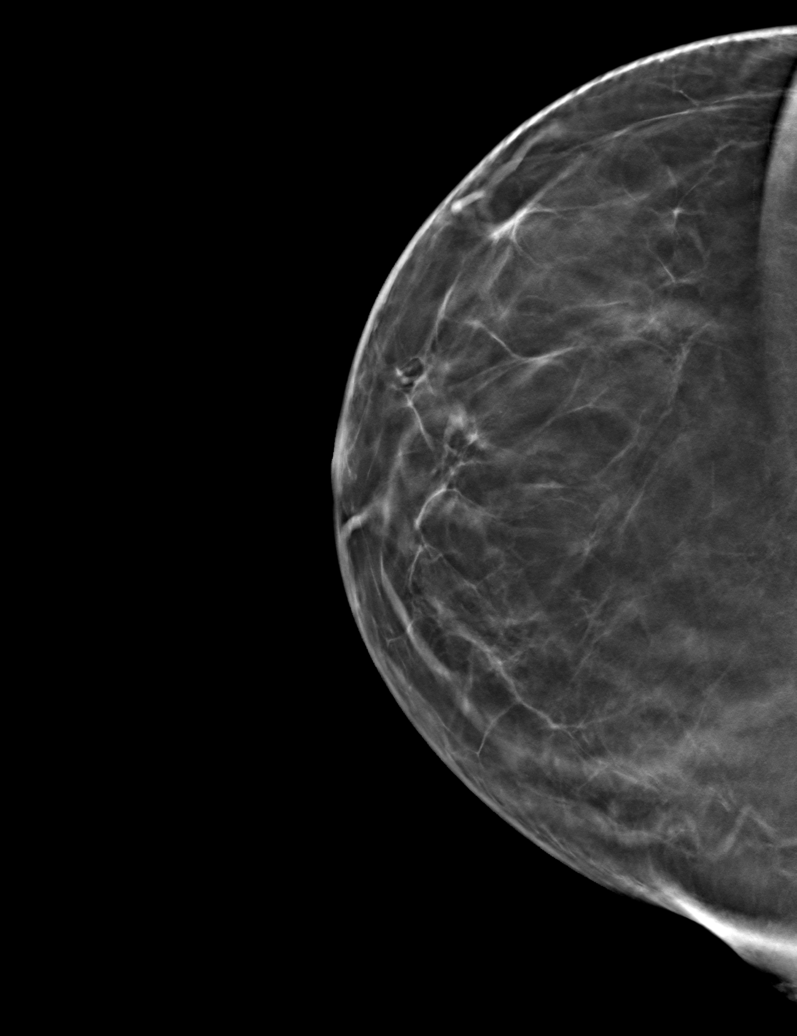

[6 of 30 positions shown; findings below may reference images not displayed]

ACR Breast Density Category b: There are scattered areas of
fibroglandular density.
FINDINGS: There are no findings suspicious for malignancy. Images were
processed with CAD.
IMPRESSION: No mammographic evidence of malignancy. A result letter of this
screening mammogram will be mailed directly to the patient.

RECOMMENDATION:
Screening mammogram in one year. (Code:[TQ])

BI-RADS CATEGORY  1: Negative.

## 2019-05-27 ENCOUNTER — Ambulatory Visit: Payer: BLUE CROSS/BLUE SHIELD | Admitting: Family Medicine

## 2019-06-01 ENCOUNTER — Other Ambulatory Visit: Payer: Self-pay

## 2019-06-01 ENCOUNTER — Encounter: Payer: Self-pay | Admitting: Family Medicine

## 2019-06-01 ENCOUNTER — Ambulatory Visit (INDEPENDENT_AMBULATORY_CARE_PROVIDER_SITE_OTHER): Payer: Self-pay | Admitting: Family Medicine

## 2019-06-01 DIAGNOSIS — M1990 Unspecified osteoarthritis, unspecified site: Secondary | ICD-10-CM | POA: Insufficient documentation

## 2019-06-01 DIAGNOSIS — E1169 Type 2 diabetes mellitus with other specified complication: Secondary | ICD-10-CM

## 2019-06-01 DIAGNOSIS — E669 Obesity, unspecified: Secondary | ICD-10-CM

## 2019-06-01 MED ORDER — MELOXICAM 15 MG PO TABS: 15.0000 mg | ORAL_TABLET | Freq: Every day | ORAL | 0 refills | Status: DC

## 2019-06-01 NOTE — Progress Notes (Signed)
Subjective:   Chief Complaint  Patient presents with  . Follow-up    Lindsey Owen is a 53 y.o. female here for follow-up of diabetes. Due to COVID-19 pandemic, we are interacting via web portal for an electronic face-to-face visit. I verified patient's ID using 2 identifiers. Patient agreed to proceed with visit via this method. Patient is at home, I am at office. Patient and I are present for visit.   Lindsey Owen's self monitored glucose range is low 100's.  Patient denies hypoglycemic reactions. She checks her glucose levels several times per week. Patient does not require insulin.   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 Has been using Lyrica, Gabapentin and used meloxicam a while ago. Nothing has been helpful, would like to try Mobic again. No recent inj or change in activity. Previous imaging shows OA of knees.   Past Medical History:  Diagnosis Date  . Diabetes mellitus without complication ([place])   . Hypertension   . Thyroid disease      Related testing: Date of retinal exam: Done Pneumovax: done  Review of Systems: Pulmonary:  No SOB Cardiovascular:  No chest pain  Objective:  No conversational dyspnea Age appropriate judgment and insight Nml affect and mood  Assessment:   Diabetes mellitus type 2 in obese ([place]) - Plan: Hemoglobin A1c, Comprehensive metabolic panel  Arthritis - Plan: meloxicam (MOBIC) 15 MG tablet, stretches/exercises.    Plan:   Orders as above. Counseled on diet and exercise. F/u in 1 mo to reck pain. The patient voiced understanding and agreement to the plan.  [place], DO 06/01/19 12:59 PM

## 2019-06-01 NOTE — Patient Instructions (Signed)
Call Center for [place] at [facility] at [phone] for an appointment.  They are located at [address], [address], [place], [place], [address] (right across the hall from our office).  Knee Exercises It is normal to feel mild stretching, pulling, tightness, or discomfort as you do these exercises, but you should stop right away if you feel sudden pain or your pain gets worse. STRETCHING AND RANGE OF MOTION EXERCISES  These exercises warm up your muscles and joints and improve the movement and flexibility of your knee. These exercises also help to relieve pain, numbness, and tingling. Exercise A: Knee Extension, Prone  1. Lie on your abdomen on a bed. 2. Place your left / right knee just beyond the edge of the surface so your knee is not on the bed. You can put a towel under your left / right thigh just above your knee for comfort. 3. Relax your leg muscles and allow gravity to straighten your knee. You should feel a stretch behind your left / right knee. 4. Hold this position for 30 seconds. 5. Scoot up so your knee is supported between repetitions. Repeat 2 times. Complete this stretch 3 times per week. Exercise B: Knee Flexion, Active    1. Lie on your back with both knees straight. If this causes back discomfort, bend your left / right knee so your foot is flat on the floor. 2. Slowly slide your left / right heel back toward your buttocks until you feel a gentle stretch in the front of your knee or thigh. 3. Hold this position for 30 seconds. 4. Slowly slide your left / right heel back to the starting position. Repeat 2 times. Complete this exercise 3 times per week. Exercise C: Quadriceps, Prone    1. Lie on your abdomen on a firm surface, such as a bed or padded floor. 2. Bend your left / right knee and hold your ankle. If you cannot reach your ankle or pant leg, loop a belt around your foot and grab the belt instead. 3. Gently pull your heel toward your  buttocks. Your knee should not slide out to the side. You should feel a stretch in the front of your thigh and knee. 4. Hold this position for 30 seconds. Repeat 2 times. Complete this stretch 3 times per week. Exercise D: Hamstring, Supine  1. Lie on your back. 2. Loop a belt or towel over the ball of your left / right foot. The ball of your foot is on the walking surface, right under your toes. 3. Straighten your left / right knee and slowly pull on the belt to raise your leg until you feel a gentle stretch behind your knee. ? Do not let your left / right knee bend while you do this. ? Keep your other leg flat on the floor. 4. Hold this position for 30 seconds. Repeat 2 times. Complete this stretch 3 times per week. STRENGTHENING EXERCISES  These exercises build strength and endurance in your knee. Endurance is the ability to use your muscles for a long time, even after they get tired. Exercise E: Quadriceps, Isometric    1. Lie on your back with your left / right leg extended and your other knee bent. Put a rolled towel or small pillow under your knee if told by your health care provider. 2. Slowly tense the muscles in the front of your left / right thigh. You should see your kneecap slide up toward your  hip or see increased dimpling just above the knee. This motion will push the back of the knee toward the floor. 3. For 3 seconds, keep the muscle as tight as you can without increasing your pain. 4. Relax the muscles slowly and completely. Repeat for 10 total reps Repeat 2 ti mes. Complete this exercise 3 times per week. Exercise F: Straight Leg Raises - Quadriceps  1. Lie on your back with your left / right leg extended and your other knee bent. 2. Tense the muscles in the front of your left / right thigh. You should see your kneecap slide up or see increased dimpling just above the knee. Your thigh may even shake a bit. 3. Keep these muscles tight as you raise your leg 4-6 inches (10-15  cm) off the floor. Do not let your knee bend. 4. Hold this position for 3 seconds. 5. Keep these muscles tense as you lower your leg. 6. Relax your muscles slowly and completely after each repetition. 10 total reps. Repeat 2 times. Complete this exercise 3 times per week.  Exercise G: Hamstring Curls    If told by your health care provider, do this exercise while wearing ankle weights. Begin with 5 lb weights (optional). Then increase the weight by 1 lb (0.5 kg) increments. Do not wear ankle weights that are more than 20 lbs to start with. 1. Lie on your abdomen with your legs straight. 2. Bend your left / right knee as far as you can without feeling pain. Keep your hips flat against the floor. 3. Hold this position for 3 seconds. 4. Slowly lower your leg to the starting position. Repeat for 10 reps.  Repeat 2 times. Complete this exercise 3 times per week. Exercise H: Squats (Quadriceps)  1. Stand in front of a table, with your feet and knees pointing straight ahead. You may rest your hands on the table for balance but not for support. 2. Slowly bend your knees and lower your hips like you are going to sit in a chair. ? Keep your weight over your heels, not over your toes. ? Keep your lower legs upright so they are parallel with the table legs. ? Do not let your hips go lower than your knees. ? Do not bend lower than told by your health care provider. ? If your knee pain increases, do not bend as low. 3. Hold the squat position for 1 second. 4. Slowly push with your legs to return to standing. Do not use your hands to pull yourself to standing. Repeat 2 times. Complete this exercise 3 times per week. Exercise I: Wall Slides (Quadriceps)    1. Lean your back against a smooth wall or door while you walk your feet out 18-24 inches (46-61 cm) from it. 2. Place your feet hip-width apart. 3. Slowly slide down the wall or door until your knees Repeat 2 times. Complete this exercise every  other day. 4. Exercise K: Straight Leg Raises - Hip Abductors  1. Lie on your side with your left / right leg in the top position. Lie so your head, shoulder, knee, and hip line up. You may bend your bottom knee to help you keep your balance. 2. Roll your hips slightly forward so your hips are stacked directly over each other and your left / right knee is facing forward. 3. Leading with your heel, lift your top leg 4-6 inches (10-15 cm). You should feel the muscles in your outer hip lifting. ? Do  not let your foot drift forward. ? Do not let your knee roll toward the ceiling. 4. Hold this position for 3 seconds. 5. Slowly return your leg to the starting position. 6. Let your muscles relax completely after each repetition. 10 total reps. Repeat 2 times. Complete this exercise 3 times per week. Exercise J: Straight Leg Raises - Hip Extensors  1. Lie on your abdomen on a firm surface. You can put a pillow under your hips if that is more comfortable. 2. Tense the muscles in your buttocks and lift your left / right leg about 4-6 inches (10-15 cm). Keep your knee straight as you lift your leg. 3. Hold this position for 3 seconds. 4. Slowly lower your leg to the starting position. 5. Let your leg relax completely after each repetition. Repeat 2 times. Complete this exercise 3 times per week. Document Released: 08/20/2005 Document Revised: 06/30/2016 Document Reviewed: 08/12/2015 [employer] Interactive Patient Education  2017 [employer]   EXERCISES  RANGE OF MOTION (ROM) AND STRETCHING EXERCISES - Low Back Pain Most people with lower back pain will find that their symptoms get worse with excessive bending forward (flexion) or arching at the lower back (extension). The exercises that will help resolve your symptoms will focus on the opposite motion.  If you have pain, numbness or tingling which travels down into your buttocks, leg or foot, the goal of the therapy is for these symptoms to move  closer to your back and eventually resolve. Sometimes, these leg symptoms will get better, but your lower back pain may worsen. This is often an indication of progress in your rehabilitation. Be very alert to any changes in your symptoms and the activities in which you participated in the 24 hours prior to the change. Sharing this information with your caregiver will allow him or her to most efficiently treat your condition. These exercises may help you when beginning to rehabilitate your injury. Your symptoms may resolve with or without further involvement from your physician, physical therapist or athletic trainer. While completing these exercises, remember:   Restoring tissue flexibility helps normal motion to return to the joints. This allows healthier, less painful movement and activity.  An effective stretch should be held for at least 30 seconds.  A stretch should never be painful. You should only feel a gentle lengthening or release in the stretched tissue. FLEXION RANGE OF MOTION AND STRETCHING EXERCISES:  STRETCH - Flexion, Single Knee to Chest   Lie on a firm bed or floor with both legs extended in front of you.  Keeping one leg in contact with the floor, bring your opposite knee to your chest. Hold your leg in place by either grabbing behind your thigh or at your knee.  Pull until you feel a gentle stretch in your low back. Hold 30 seconds.  Slowly release your grasp and repeat the exercise with the opposite side. Repeat 2 times. Complete this exercise 3 times per week.   STRETCH - Flexion, Double Knee to Chest  Lie on a firm bed or floor with both legs extended in front of you.  Keeping one leg in contact with the floor, bring your opposite knee to your chest.  Tense your stomach muscles to support your back and then lift your other knee to your chest. Hold your legs in place by either grabbing behind your thighs or at your knees.  Pull both knees toward your chest until you  feel a gentle stretch in your low back. Hold  30 seconds.  Tense your stomach muscles and slowly return one leg at a time to the floor. Repeat 2 times. Complete this exercise 3 times per week.   STRETCH - Low Trunk Rotation  Lie on a firm bed or floor. Keeping your legs in front of you, bend your knees so they are both pointed toward the ceiling and your feet are flat on the floor.  Extend your arms out to the side. This will stabilize your upper body by keeping your shoulders in contact with the floor.  Gently and slowly drop both knees together to one side until you feel a gentle stretch in your low back. Hold for 30 seconds.  Tense your stomach muscles to support your lower back as you bring your knees back to the starting position. Repeat the exercise to the other side. Repeat 2 times. Complete this exercise at least 3 times per week.   EXTENSION RANGE OF MOTION AND FLEXIBILITY EXERCISES:  STRETCH - Extension, Prone on Elbows   Lie on your stomach on the floor, a bed will be too soft. Place your palms about shoulder width apart and at the height of your head.  Place your elbows under your shoulders. If this is too painful, stack pillows under your chest.  Allow your body to relax so that your hips drop lower and make contact more completely with the floor.  Hold this position for 30 seconds.  Slowly return to lying flat on the floor. Repeat 2 times. Complete this exercise 3 times per week.   RANGE OF MOTION - Extension, Prone Press Ups  Lie on your stomach on the floor, a bed will be too soft. Place your palms about shoulder width apart and at the height of your head.  Keeping your back as relaxed as possible, slowly straighten your elbows while keeping your hips on the floor. You may adjust the placement of your hands to maximize your comfort. As you gain motion, your hands will come more underneath your shoulders.  Hold this position 30 seconds.  Slowly return to lying  flat on the floor. Repeat 2 times. Complete this exercise 3 times per week.   RANGE OF MOTION- Quadruped, Neutral Spine   Assume a hands and knees position on a firm surface. Keep your hands under your shoulders and your knees under your hips. You may place padding under your knees for comfort.  Drop your head and point your tailbone toward the ground below you. This will round out your lower back like an angry cat. Hold this position for 30 seconds.  Slowly lift your head and release your tail bone so that your back sags into a large arch, like an old horse.  Hold this position for 30 seconds.  Repeat this until you feel limber in your low back.  Now, find your "sweet spot." This will be the most comfortable position somewhere between the two previous positions. This is your neutral spine. Once you have found this position, tense your stomach muscles to support your low back.  Hold this position for 30 seconds. Repeat 2 times. Complete this exercise 3 times per week.   STRENGTHENING EXERCISES - Low Back Sprain These exercises may help you when beginning to rehabilitate your injury. These exercises should be done near your "sweet spot." This is the neutral, low-back arch, somewhere between fully rounded and fully arched, that is your least painful position. When performed in this safe range of motion, these exercises can be used for  people who have either a flexion or extension based injury. These exercises may resolve your symptoms with or without further involvement from your physician, physical therapist or athletic trainer. While completing these exercises, remember:   Muscles can gain both the endurance and the strength needed for everyday activities through controlled exercises.  Complete these exercises as instructed by your physician, physical therapist or athletic trainer. Increase the resistance and repetitions only as guided.  You may experience muscle soreness or fatigue, but  the pain or discomfort you are trying to eliminate should never worsen during these exercises. If this pain does worsen, stop and make certain you are following the directions exactly. If the pain is still present after adjustments, discontinue the exercise until you can discuss the trouble with your caregiver.  STRENGTHENING - Deep Abdominals, Pelvic Tilt   Lie on a firm bed or floor. Keeping your legs in front of you, bend your knees so they are both pointed toward the ceiling and your feet are flat on the floor.  Tense your lower abdominal muscles to press your low back into the floor. This motion will rotate your pelvis so that your tail bone is scooping upwards rather than pointing at your feet or into the floor. With a gentle tension and even breathing, hold this position for 3 seconds. Repeat 2 times. Complete this exercise 3 times per week.   STRENGTHENING - Abdominals, Crunches   Lie on a firm bed or floor. Keeping your legs in front of you, bend your knees so they are both pointed toward the ceiling and your feet are flat on the floor. Cross your arms over your chest.  Slightly tip your chin down without bending your neck.  Tense your abdominals and slowly lift your trunk high enough to just clear your shoulder blades. Lifting higher can put excessive stress on the lower back and does not further strengthen your abdominal muscles.  Control your return to the starting position. Repeat 2 times. Complete this exercise 3 times per week.   STRENGTHENING - Quadruped, Opposite UE/LE Lift   Assume a hands and knees position on a firm surface. Keep your hands under your shoulders and your knees under your hips. You may place padding under your knees for comfort.  Find your neutral spine and gently tense your abdominal muscles so that you can maintain this position. Your shoulders and hips should form a rectangle that is parallel with the floor and is not twisted.  Keeping your trunk  steady, lift your right hand no higher than your shoulder and then your left leg no higher than your hip. Make sure you are not holding your breath. Hold this position for 30 seconds.  Continuing to keep your abdominal muscles tense and your back steady, slowly return to your starting position. Repeat with the opposite arm and leg. Repeat 2 times. Complete this exercise 3 times per week.   STRENGTHENING - Abdominals and Quadriceps, Straight Leg Raise   Lie on a firm bed or floor with both legs extended in front of you.  Keeping one leg in contact with the floor, bend the other knee so that your foot can rest flat on the floor.  Find your neutral spine, and tense your abdominal muscles to maintain your spinal position throughout the exercise.  Slowly lift your straight leg off the floor about 6 inches for a count of 3, making sure to not hold your breath.  Still keeping your neutral spine, slowly lower your leg  all the way to the floor. Repeat this exercise with each leg 2 times. Complete this exercise 3 times per week.  POSTURE AND BODY MECHANICS CONSIDERATIONS - Low Back Sprain Keeping correct posture when sitting, standing or completing your activities will reduce the stress put on different body tissues, allowing injured tissues a chance to heal and limiting painful experiences. The following are general guidelines for improved posture.  While reading these guidelines, remember:  The exercises prescribed by your provider will help you have the flexibility and strength to maintain correct postures.  The correct posture provides the best environment for your joints to work. All of your joints have less wear and tear when properly supported by a spine with good posture. This means you will experience a healthier, less painful body.  Correct posture must be practiced with all of your activities, especially prolonged sitting and standing. Correct posture is as important when doing repetitive  low-stress activities (typing) as it is when doing a single heavy-load activity (lifting).  RESTING POSITIONS Consider which positions are most painful for you when choosing a resting position. If you have pain with flexion-based activities (sitting, bending, stooping, squatting), choose a position that allows you to rest in a less flexed posture. You would want to avoid curling into a fetal position on your side. If your pain worsens with extension-based activities (prolonged standing, working overhead), avoid resting in an extended position such as sleeping on your stomach. Most people will find more comfort when they rest with their spine in a more neutral position, neither too rounded nor too arched. Lying on a non-sagging bed on your side with a pillow between your knees, or on your back with a pillow under your knees will often provide some relief. Keep in mind, being in any one position for a prolonged period of time, no matter how correct your posture, can still lead to stiffness.  PROPER SITTING POSTURE In order to minimize stress and discomfort on your spine, you must sit with correct posture. Sitting with good posture should be effortless for a healthy body. Returning to good posture is a gradual process. Many people can work toward this most comfortably by using various supports until they have the flexibility and strength to maintain this posture on their own. When sitting with proper posture, your ears will fall over your shoulders and your shoulders will fall over your hips. You should use the back of the chair to support your upper back. Your lower back will be in a neutral position, just slightly arched. You may place a small pillow or folded towel at the base of your lower back for  support.  When working at a desk, create an environment that supports good, upright posture. Without extra support, muscles tire, which leads to excessive strain on joints and other tissues. Keep these  recommendations in mind:  CHAIR:  A chair should be able to slide under your desk when your back makes contact with the back of the chair. This allows you to work closely.  The chair's height should allow your eyes to be level with the upper part of your monitor and your hands to be slightly lower than your elbows.  BODY POSITION  Your feet should make contact with the floor. If this is not possible, use a foot rest.  Keep your ears over your shoulders. This will reduce stress on your neck and low back.  INCORRECT SITTING POSTURES  If you are feeling tired and unable to assume  a healthy sitting posture, do not slouch or slump. This puts excessive strain on your back tissues, causing more damage and pain. Healthier options include:  Using more support, like a lumbar pillow.  Switching tasks to something that requires you to be upright or walking.  Talking a brief walk.  Lying down to rest in a neutral-spine position.  PROLONGED STANDING WHILE SLIGHTLY LEANING FORWARD  When completing a task that requires you to lean forward while standing in one place for a long time, place either foot up on a stationary 2-4 inch high object to help maintain the best posture. When both feet are on the ground, the lower back tends to lose its slight inward curve. If this curve flattens (or becomes too large), then the back and your other joints will experience too much stress, tire more quickly, and can cause pain.  CORRECT STANDING POSTURES Proper standing posture should be assumed with all daily activities, even if they only take a few moments, like when brushing your teeth. As in sitting, your ears should fall over your shoulders and your shoulders should fall over your hips. You should keep a slight tension in your abdominal muscles to brace your spine. Your tailbone should point down to the ground, not behind your body, resulting in an over-extended swayback posture.   INCORRECT STANDING POSTURES    Common incorrect standing postures include a forward head, locked knees and/or an excessive swayback. WALKING Walk with an upright posture. Your ears, shoulders and hips should all line-up.  PROLONGED ACTIVITY IN A FLEXED POSITION When completing a task that requires you to bend forward at your waist or lean over a low surface, try to find a way to stabilize 3 out of 4 of your limbs. You can place a hand or elbow on your thigh or rest a knee on the surface you are reaching across. This will provide you more stability, so that your muscles do not tire as quickly. By keeping your knees relaxed, or slightly bent, you will also reduce stress across your lower back. CORRECT LIFTING TECHNIQUES  DO :  Assume a wide stance. This will provide you more stability and the opportunity to get as close as possible to the object which you are lifting.  Tense your abdominals to brace your spine. Bend at the knees and hips. Keeping your back locked in a neutral-spine position, lift using your leg muscles. Lift with your legs, keeping your back straight.  Test the weight of unknown objects before attempting to lift them.  Try to keep your elbows locked down at your sides in order get the best strength from your shoulders when carrying an object.     Always ask for help when lifting heavy or awkward objects. INCORRECT LIFTING TECHNIQUES DO NOT:   Lock your knees when lifting, even if it is a small object.  Bend and twist. Pivot at your feet or move your feet when needing to change directions.  Assume that you can safely pick up even a paperclip without proper posture.

## 2019-06-17 ENCOUNTER — Other Ambulatory Visit: Payer: Self-pay

## 2019-07-01 ENCOUNTER — Other Ambulatory Visit: Payer: Self-pay

## 2019-07-15 ENCOUNTER — Ambulatory Visit (INDEPENDENT_AMBULATORY_CARE_PROVIDER_SITE_OTHER): Payer: BLUE CROSS/BLUE SHIELD | Admitting: Family Medicine

## 2019-07-15 ENCOUNTER — Encounter: Payer: Self-pay | Admitting: Family Medicine

## 2019-07-15 ENCOUNTER — Telehealth: Payer: Self-pay | Admitting: Family Medicine

## 2019-07-15 ENCOUNTER — Other Ambulatory Visit: Payer: Self-pay

## 2019-07-15 VITALS — BP 120/80 | HR 95 | Temp 97.3°F | Ht 63.0 in | Wt 189.0 lb

## 2019-07-15 DIAGNOSIS — E669 Obesity, unspecified: Secondary | ICD-10-CM | POA: Diagnosis not present

## 2019-07-15 DIAGNOSIS — E1169 Type 2 diabetes mellitus with other specified complication: Secondary | ICD-10-CM

## 2019-07-15 DIAGNOSIS — R252 Cramp and spasm: Secondary | ICD-10-CM | POA: Diagnosis not present

## 2019-07-15 NOTE — Telephone Encounter (Signed)
Copied from [place] [phone]. Topic: Appointment Scheduling - Scheduling Inquiry for Clinic >> Jul 15, 2019  7:55 AM Scherrie Gerlach wrote: Reason for CRM: pt states she is having leg cramps at night.  Last 2 weeks have gotten bad.  It wakes her up 2 -3 X a night.  She is on her feet 14 hours a day.  She would like to speak to Dr Nani Ravens so he can advise what she can do and or take for this issue. No appts that I can schedule any time soon   Scheduled at 4:15 today

## 2019-07-15 NOTE — Patient Instructions (Addendum)
Give [place] 2-3 business days to get the results of your labs back.   Try to get at least 60-65 oz of water a day. Cut down on caffeine intake as this can make you urinate.   Consider over the counter magnesium 200-400 mg daily.  Take a Vit D supplement 2000 units daily.   Try taking a spoonful of pickle juice nightly.   Let us know if you need anything.

## 2019-07-15 NOTE — Progress Notes (Signed)
Chief Complaint  Patient presents with  . leg cramps    worsened the past 2 weeks.      Subjective: Patient is a 53 y.o. female here for leg cramps.  Over the past 2 weeks, has been having worsening R leg cramping of calf near knee. Happens after starting for 14-15 hrs at a time at work. Worse at night. Tries to stay hydrated. Drinks coffee.    ROS: MSK: +calf cramping  Past Medical History:  Diagnosis Date  . Diabetes mellitus without complication ([place])   . Hypertension   . Thyroid disease     Objective: BP 120/80 (BP Location: Left Arm, Patient Position: Sitting, Cuff Size: Normal)   Pulse 95   Temp (!) 97.3 F (36.3 C) (Temporal)   Ht 5\' 3"  (1.6 m)   Wt 189 lb (85.7 kg)   SpO2 98%   BMI 33.48 kg/m  General: Awake, appears stated age MSK: +ttp over entire calf of R and L Lungs: No accessory muscle use Psych: Age appropriate judgment and insight, normal affect and mood  Assessment and Plan: Leg cramping - Plan: Comprehensive metabolic panel, Magnesium  Diabetes mellitus type 2 in obese ([place]) - Plan: Hemoglobin A1c  60-65 oz water daily. Electrolytes rec'd. Pickle juice. Mg.  F/u prn for this.  The patient voiced understanding and agreement to the plan.  [place], DO 07/15/19  4:52 PM

## 2019-07-16 ENCOUNTER — Other Ambulatory Visit: Payer: Self-pay | Admitting: Family Medicine

## 2019-07-16 LAB — COMPREHENSIVE METABOLIC PANEL
AG Ratio: 1.1 (calc) (ref 1.0–2.5)
ALT: 58 U/L — ABNORMAL HIGH (ref 6–29)
AST: 68 U/L — ABNORMAL HIGH (ref 10–35)
Albumin: 4.1 g/dL (ref 3.6–5.1)
Alkaline phosphatase (APISO): 102 U/L (ref 37–153)
BUN: 9 mg/dL (ref 7–25)
CO2: 29 mmol/L (ref 20–32)
Calcium: 10 mg/dL (ref 8.6–10.4)
Chloride: 93 mmol/L — ABNORMAL LOW (ref 98–110)
Creat: 0.77 mg/dL (ref 0.50–1.05)
Globulin: 3.6 g/dL (calc) (ref 1.9–3.7)
Glucose, Bld: 340 mg/dL — ABNORMAL HIGH (ref 65–99)
Potassium: 4.4 mmol/L (ref 3.5–5.3)
Sodium: 133 mmol/L — ABNORMAL LOW (ref 135–146)
Total Bilirubin: 0.7 mg/dL (ref 0.2–1.2)
Total Protein: 7.7 g/dL (ref 6.1–8.1)

## 2019-07-16 LAB — HEMOGLOBIN A1C
Hgb A1c MFr Bld: 8.9 % of total Hgb — ABNORMAL HIGH (ref <5.7)
Mean Plasma Glucose: 209 (calc)
eAG (mmol/L): 11.6 (calc)

## 2019-07-16 LAB — MAGNESIUM: Magnesium: 1.6 mg/dL (ref 1.5–2.5)

## 2019-07-16 MED ORDER — DAPAGLIFLOZIN PROPANEDIOL 10 MG PO TABS: 10.0000 mg | ORAL_TABLET | Freq: Every day | ORAL | 2 refills | Status: DC

## 2019-07-18 ENCOUNTER — Ambulatory Visit: Payer: Self-pay | Admitting: Family Medicine

## 2019-07-19 ENCOUNTER — Ambulatory Visit: Payer: Self-pay | Admitting: Family Medicine

## 2019-08-23 ENCOUNTER — Other Ambulatory Visit: Payer: Self-pay | Admitting: Family Medicine

## 2019-08-23 DIAGNOSIS — E119 Type 2 diabetes mellitus without complications: Secondary | ICD-10-CM

## 2019-08-23 DIAGNOSIS — I10 Essential (primary) hypertension: Secondary | ICD-10-CM

## 2019-09-08 ENCOUNTER — Other Ambulatory Visit: Payer: Self-pay

## 2019-09-09 ENCOUNTER — Ambulatory Visit (INDEPENDENT_AMBULATORY_CARE_PROVIDER_SITE_OTHER): Payer: BC Managed Care – PPO | Admitting: Family Medicine

## 2019-09-09 ENCOUNTER — Encounter: Payer: Self-pay | Admitting: Family Medicine

## 2019-09-09 ENCOUNTER — Other Ambulatory Visit: Payer: Self-pay

## 2019-09-09 VITALS — BP 120/84 | HR 77 | Temp 97.7°F | Ht 61.0 in | Wt 182.0 lb

## 2019-09-09 DIAGNOSIS — M545 Low back pain, unspecified: Secondary | ICD-10-CM

## 2019-09-09 DIAGNOSIS — L729 Follicular cyst of the skin and subcutaneous tissue, unspecified: Secondary | ICD-10-CM

## 2019-09-09 DIAGNOSIS — G8929 Other chronic pain: Secondary | ICD-10-CM | POA: Diagnosis not present

## 2019-09-09 DIAGNOSIS — M1990 Unspecified osteoarthritis, unspecified site: Secondary | ICD-10-CM

## 2019-09-09 MED ORDER — MELOXICAM 15 MG PO TABS: 15.0000 mg | ORAL_TABLET | Freq: Every day | ORAL | 0 refills | Status: DC

## 2019-09-09 MED ORDER — KETOROLAC TROMETHAMINE 60 MG/2ML IM SOLN
60.0000 mg | Freq: Once | INTRAMUSCULAR | Status: AC
  Administered 2019-09-09: 60 mg via INTRAMUSCULAR

## 2019-09-09 NOTE — Progress Notes (Signed)
Chief Complaint  Patient presents with  . Breast Problem    knot under left breast  . Back Pain    Lindsey Owen is a 53 y.o. female here for a skin complaint.  Duration: 1 month Location: just under L breast  Pruritic? No Painful? No Drainage? No New soaps/lotions/topicals/detergents? No Other associated symptoms: not growing, no skin color changes Therapies tried thus far: none  Low back pain started around 1 mo, got worse as she continues to stand on concrete for work. Has tried heat/ice, ibuprofen with little relief. No weakness. Stands for 16 hrs/d, exacerbates her situation.   ROS:  Const: No fevers Skin: As noted in HPI  Past Medical History:  Diagnosis Date  . Diabetes mellitus without complication ([place])   . Hypertension   . Thyroid disease     BP 120/84 (BP Location: Left Arm, Patient Position: Sitting, Cuff Size: Normal)   Pulse 77   Temp 97.7 F (36.5 C) (Temporal)   Ht 5\' 1"  (1.549 m)   Wt 182 lb (82.6 kg)   SpO2 94%   BMI 34.39 kg/m  Gen: awake, alert, appearing stated age Lungs: No accessory muscle use Skin: Examined in presence of female chaperone. There is a 1 cm superficial lesion without skin tone changes over the top. It is freely moveable.  No drainage, erythema, TTP, fluctuance, excoriation. It is not over the breast, but the L ant axillary line at approx R 8 MSK: +TTP over midline and lumbar/lower thoracic parasp msc. Neg straight leg b/l, hamstring (hip flexion) ROM around 80 degrees b/l.  Neuro: Gait nml, no cerebellar signs, DTR's equal and symmetric, no clonus Psych: Age appropriate judgment and insight  Chronic bilateral low back pain without sciatica - Plan: ketorolac (TORADOL) injection 60 mg  Arthritis - Plan: meloxicam (MOBIC) 15 MG tablet, ketorolac (TORADOL) injection 60 mg  Cyst of skin  1- Cont stretches/exercises. Ice, heat, Tylenol, Mobic starting 11/21. Wear supportive shoes, consider arch supports. Letter for less time off  of feet.  2- Reassurance. If bothersome, can excise or refer to derm. F/u prn. The patient voiced understanding and agreement to the plan.  [place], DO 09/10/19 12:25 PM

## 2019-09-09 NOTE — Patient Instructions (Signed)
Continue the stretches/exercises.  Heat (pad or rice pillow in microwave) over affected area, 10-15 minutes twice daily.   Ice/cold pack over area for 10-15 min twice daily.  OK to take Tylenol 1000 mg (2 extra strength tabs) or 975 mg (3 regular strength tabs) every 6 hours as needed.  Start the [employer].  Do not worry about the skin lesion. It is likely a cyst, which is not a worrisome skin finding. If there is a change let me know.  Let us know if you need anything.

## 2019-10-24 ENCOUNTER — Ambulatory Visit: Payer: BC Managed Care – PPO | Attending: Internal Medicine

## 2019-10-24 DIAGNOSIS — Z20822 Contact with and (suspected) exposure to covid-19: Secondary | ICD-10-CM | POA: Diagnosis not present

## 2019-10-25 LAB — NOVEL CORONAVIRUS, NAA: SARS-CoV-2, NAA: NOT DETECTED

## 2020-01-06 ENCOUNTER — Ambulatory Visit: Payer: BC Managed Care – PPO | Attending: Internal Medicine

## 2020-01-06 DIAGNOSIS — Z23 Encounter for immunization: Secondary | ICD-10-CM

## 2020-01-06 NOTE — Progress Notes (Signed)
   Covid-19 Vaccination Clinic  Name:  Javeah Loeza    MRN: [id number] DOB: Aug 25, 1966  01/06/2020  Ms. Canepa was observed post Covid-19 immunization for 15 minutes without incident. She was provided with Vaccine Information Sheet and instruction to access the V-Safe system.   Ms. Garron was instructed to call 911 with any severe reactions post vaccine: Marland Kitchen Difficulty breathing  . Swelling of face and throat  . A fast heartbeat  . A bad rash all over body  . Dizziness and weakness   Immunizations Administered    Name Date Dose VIS Date Route   Pfizer COVID-19 Vaccine 01/06/2020 11:04 AM 0.3 mL 09/30/2019 Intramuscular   Manufacturer: [employer], [employer]   Lot: [id number]   NDC: [address]-2

## 2020-01-28 ENCOUNTER — Other Ambulatory Visit: Payer: Self-pay | Admitting: Family Medicine

## 2020-01-28 DIAGNOSIS — E669 Obesity, unspecified: Secondary | ICD-10-CM

## 2020-01-28 DIAGNOSIS — I10 Essential (primary) hypertension: Secondary | ICD-10-CM

## 2020-01-28 DIAGNOSIS — E119 Type 2 diabetes mellitus without complications: Secondary | ICD-10-CM

## 2020-01-28 DIAGNOSIS — E1169 Type 2 diabetes mellitus with other specified complication: Secondary | ICD-10-CM

## 2020-01-30 NOTE — Telephone Encounter (Signed)
Requesting:lyrica Contract:no UDS:n/a Last OV: 09/09/19 Next OV:n/a Last Refill:08/23/19 Database:   Please advise

## 2020-02-01 ENCOUNTER — Ambulatory Visit: Payer: BC Managed Care – PPO | Attending: Internal Medicine

## 2020-02-01 DIAGNOSIS — Z23 Encounter for immunization: Secondary | ICD-10-CM

## 2020-02-01 NOTE — Progress Notes (Signed)
   Covid-19 Vaccination Clinic  Name:  Lindsey Owen    MRN: [id number] DOB: [birth year]  02/01/2020  [name] was observed post Covid-19 immunization for 15 minutes without incident. She was provided with Vaccine Information Sheet and instruction to access the V-Safe system.   [name] was instructed to call 911 with any severe reactions post vaccine: Marland Kitchen Difficulty breathing  . Swelling of face and throat  . A fast heartbeat  . A bad rash all over body  . Dizziness and weakness   Immunizations Administered    Name Date Dose VIS Date Route   Pfizer COVID-19 Vaccine 02/01/2020  9:11 AM 0.3 mL 09/30/2019 Intramuscular   Manufacturer: [employer], [employer]   Lot: [id number]   NDC: [address]-2

## 2020-02-27 ENCOUNTER — Ambulatory Visit (INDEPENDENT_AMBULATORY_CARE_PROVIDER_SITE_OTHER): Payer: BC Managed Care – PPO | Admitting: Family Medicine

## 2020-02-27 ENCOUNTER — Other Ambulatory Visit: Payer: Self-pay

## 2020-02-27 ENCOUNTER — Encounter: Payer: Self-pay | Admitting: Family Medicine

## 2020-02-27 VITALS — BP 134/86 | HR 70 | Temp 95.5°F | Ht 62.0 in | Wt 182.5 lb

## 2020-02-27 DIAGNOSIS — E1169 Type 2 diabetes mellitus with other specified complication: Secondary | ICD-10-CM | POA: Diagnosis not present

## 2020-02-27 DIAGNOSIS — R319 Hematuria, unspecified: Secondary | ICD-10-CM | POA: Diagnosis not present

## 2020-02-27 DIAGNOSIS — E669 Obesity, unspecified: Secondary | ICD-10-CM

## 2020-02-27 DIAGNOSIS — M545 Low back pain, unspecified: Secondary | ICD-10-CM

## 2020-02-27 DIAGNOSIS — M5136 Other intervertebral disc degeneration, lumbar region: Secondary | ICD-10-CM

## 2020-02-27 LAB — POC URINALSYSI DIPSTICK (AUTOMATED)
Bilirubin, UA: NEGATIVE
Blood, UA: NEGATIVE
Glucose, UA: POSITIVE — AB
Ketones, UA: NEGATIVE
Leukocytes, UA: NEGATIVE
Nitrite, UA: NEGATIVE
Protein, UA: NEGATIVE
Spec Grav, UA: 1.02 (ref 1.010–1.025)
Urobilinogen, UA: 2 E.U./dL — AB
pH, UA: 5.5 (ref 5.0–8.0)

## 2020-02-27 MED ORDER — TRAMADOL HCL 50 MG PO TABS: 50.0000 mg | ORAL_TABLET | Freq: Three times a day (TID) | ORAL | 0 refills | Status: AC | PRN

## 2020-02-27 MED ORDER — AMITRIPTYLINE HCL 25 MG PO TABS: 25.0000 mg | ORAL_TABLET | Freq: Every day | ORAL | 2 refills | Status: DC

## 2020-02-27 MED ORDER — KETOROLAC TROMETHAMINE 60 MG/2ML IM SOLN
60.0000 mg | Freq: Once | INTRAMUSCULAR | Status: AC
  Administered 2020-02-27: 12:00:00 60 mg via INTRAMUSCULAR

## 2020-02-27 MED ORDER — MELOXICAM 15 MG PO TABS: 15.0000 mg | ORAL_TABLET | Freq: Every day | ORAL | 0 refills | Status: DC

## 2020-02-27 MED ORDER — GLYBURIDE 5 MG PO TABS: 5.0000 mg | ORAL_TABLET | Freq: Every day | ORAL | 2 refills | Status: DC

## 2020-02-27 NOTE — Addendum Note (Signed)
Addended by: Thelma Barge D on: 02/27/2020 12:07 PM   Modules accepted: Orders

## 2020-02-27 NOTE — Progress Notes (Signed)
Musculoskeletal Exam  Patient: Lindsey Owen DOB: [birth year]  DOS: 02/27/2020  SUBJECTIVE:  Chief Complaint:   Chief Complaint  Patient presents with  . Back Pain  . Hematuria    Lindsey Owen is a 54 y.o.  female for evaluation and treatment of her back pain.   Onset:  2 days ago.  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 +hx of DDD. Location: lower Character:  aching and sharp  Progression of issue:  has worsened Associated symptoms: saw blood in urine this AM; no other urinary s/s's; no fevers, bruising, redness, swelling Denies bowel/bladder incontinence or weakness Treatment: to date has been OTC NSAIDS.   Neurovascular symptoms: no Failed gabapentin, Lyrica, Cymbalta, Effexor for DDD, was seeing chiropractor but stopped going 3 mo ago as her work responsibilities increased.   Pt's sugars have been running in 200's. Compliant w metformin 500 mg bid, no AE's. Diet is fair, doesn't eat much, largely healthy. No exercise. Has been working 17-18 hr days, 7 d/week.   Past Medical History:  Diagnosis Date  . Diabetes mellitus without complication ([facility])   . Hypertension   . Thyroid disease     Objective:  VITAL SIGNS: BP 134/86 (BP Location: Left Arm, Patient Position: Sitting, Cuff Size: Normal)   Pulse 70   Temp (!) 95.5 F (35.3 C) (Temporal)   Ht 5\' 2"  (1.575 m)   Wt 182 lb 8 oz (82.8 kg)   SpO2 94%   BMI 33.38 kg/m  Constitutional: Well formed, well developed. No acute distress. Abd: BS+, S, NT, ND Thorax & Lungs:  CTAB. No accessory muscle use Heart: RRR Musculoskeletal: low back.   Tenderness to palpation: yes over L lumbar and lower thor parasp msc Deformity: no Ecchymosis: no Straight leg test: negative for Poor hamstring flexibility b/l. Neurologic: Normal sensory function. No focal deficits noted. DTR's equal and symmetric in LE's. No clonus. Psychiatric: Normal mood. Age appropriate judgment and insight. Alert & oriented x 3.    Assessment:  Acute  left-sided low back pain without sciatica - Plan: traMADol (ULTRAM) 50 MG tablet, meloxicam (MOBIC) 15 MG tablet  DDD (degenerative disc disease), lumbar - Plan: amitriptyline (ELAVIL) 25 MG tablet  Diabetes mellitus type 2 in obese ([facility]) - Plan: glyBURIDE (DIABETA) 5 MG tablet  Hematuria, unspecified type - Plan: POCT Urinalysis Dipstick (Automated), Urine Culture  Plan: Orders as above. Stretches/exercises, heat, ice, Tylenol, NSAIDs. UA neg. Doubt infection, no blood. She will let know if anything changes with urination/bleeding.  Add glyburide to Metformin. Counseled on diet/exercise, though her work schedule is not conducive to this. F/u in 1 mo for this.  The patient voiced understanding and agreement to the plan.   [place], DO 02/27/20  11:59 AM

## 2020-02-27 NOTE — Patient Instructions (Addendum)

## 2020-02-28 LAB — URINE CULTURE
MICRO NUMBER:: 10458571
SPECIMEN QUALITY:: ADEQUATE

## 2020-04-05 DIAGNOSIS — M542 Cervicalgia: Secondary | ICD-10-CM | POA: Diagnosis not present

## 2020-04-05 DIAGNOSIS — M5412 Radiculopathy, cervical region: Secondary | ICD-10-CM | POA: Diagnosis not present

## 2020-04-05 DIAGNOSIS — M4722 Other spondylosis with radiculopathy, cervical region: Secondary | ICD-10-CM | POA: Diagnosis not present

## 2020-05-26 ENCOUNTER — Other Ambulatory Visit: Payer: Self-pay | Admitting: Family Medicine

## 2020-05-26 DIAGNOSIS — E1169 Type 2 diabetes mellitus with other specified complication: Secondary | ICD-10-CM

## 2020-05-26 DIAGNOSIS — E669 Obesity, unspecified: Secondary | ICD-10-CM

## 2020-05-26 DIAGNOSIS — M545 Low back pain, unspecified: Secondary | ICD-10-CM

## 2020-05-26 DIAGNOSIS — M5136 Other intervertebral disc degeneration, lumbar region: Secondary | ICD-10-CM

## 2020-05-26 DIAGNOSIS — I10 Essential (primary) hypertension: Secondary | ICD-10-CM

## 2020-06-14 ENCOUNTER — Other Ambulatory Visit: Payer: Self-pay | Admitting: Family Medicine

## 2020-07-02 ENCOUNTER — Other Ambulatory Visit: Payer: Self-pay | Admitting: Family Medicine

## 2020-07-02 DIAGNOSIS — E669 Obesity, unspecified: Secondary | ICD-10-CM

## 2020-07-02 DIAGNOSIS — E1169 Type 2 diabetes mellitus with other specified complication: Secondary | ICD-10-CM

## 2020-07-02 DIAGNOSIS — E119 Type 2 diabetes mellitus without complications: Secondary | ICD-10-CM

## 2020-08-03 ENCOUNTER — Other Ambulatory Visit: Payer: Self-pay | Admitting: Family

## 2020-08-03 ENCOUNTER — Other Ambulatory Visit: Payer: Self-pay

## 2020-08-03 ENCOUNTER — Ambulatory Visit (INDEPENDENT_AMBULATORY_CARE_PROVIDER_SITE_OTHER): Payer: BC Managed Care – PPO | Admitting: Family

## 2020-08-03 VITALS — BP 151/95 | HR 69 | Temp 97.8°F | Resp 16 | Ht 62.0 in | Wt 172.0 lb

## 2020-08-03 DIAGNOSIS — N611 Abscess of the breast and nipple: Secondary | ICD-10-CM

## 2020-08-03 MED ORDER — CEPHALEXIN 500 MG PO CAPS
500.0000 mg | ORAL_CAPSULE | Freq: Four times a day (QID) | ORAL | 0 refills | Status: DC
Start: 2020-08-03 — End: 2020-08-03

## 2020-08-03 MED ORDER — CEPHALEXIN 500 MG PO CAPS: 500.0000 mg | ORAL_CAPSULE | Freq: Four times a day (QID) | ORAL | 0 refills | Status: DC

## 2020-08-03 MED ORDER — HYDROCODONE-ACETAMINOPHEN 5-300 MG PO TABS: 1.0000 | ORAL_TABLET | Freq: Four times a day (QID) | ORAL | 0 refills | Status: DC | PRN

## 2020-08-03 NOTE — Addendum Note (Signed)
Addended by: Sandford Craze on: 08/03/2020 03:12 PM   Modules accepted: Orders

## 2020-08-03 NOTE — Progress Notes (Signed)
Subjective:    Patient ID: Lindsey Owen, female    DOB: 1966-09-25, 54 y.o.   MRN: [id number]  HPI  Patient is a 54 yr old female who presents today with chief complaint of left sided breast mass. She reports that she first noted several months ago, but it was small and she hoped it would go away.  The last 5-6 days it has become increasingly more painful.    Review of Systems See HPI  Past Medical History:  Diagnosis Date  . Diabetes mellitus without complication ([facility])   . Hypertension   . Thyroid disease      Social History   Socioeconomic History  . Marital status: Married    Spouse name: Not on file  . Number of children: Not on file  . Years of education: Not on file  . Highest education level: Not on file  Occupational History  . Not on file  Tobacco Use  . Smoking status: Never Smoker  . Smokeless tobacco: Never Used  Substance and Sexual Activity  . Alcohol use: Never  . Drug use: No  . Sexual activity: Not on file  Other Topics Concern  . Not on file  Social History Narrative  . Not on file   Social Determinants of Health   Financial Resource Strain:   . Difficulty of Paying Living Expenses: Not on file  Food Insecurity:   . Worried About Programme researcher, broadcasting/film/video in the Last Year: Not on file  . Ran Out of Food in the Last Year: Not on file  Transportation Needs:   . Lack of Transportation (Medical): Not on file  . Lack of Transportation (Non-Medical): Not on file  Physical Activity:   . Days of Exercise per Week: Not on file  . Minutes of Exercise per Session: Not on file  Stress:   . Feeling of Stress : Not on file  Social Connections:   . Frequency of Communication with Friends and Family: Not on file  . Frequency of Social Gatherings with Friends and Family: Not on file  . Attends Religious Services: Not on file  . Active Member of Clubs or Organizations: Not on file  . Attends Banker Meetings: Not on file  . Marital Status: Not  on file  Intimate Partner Violence:   . Fear of Current or Ex-Partner: Not on file  . Emotionally Abused: Not on file  . Physically Abused: Not on file  . Sexually Abused: Not on file    Past Surgical History:  Procedure Laterality Date  . CESAREAN SECTION  2000  . UTERINE FIBROID SURGERY      Family History  Problem Relation Age of Onset  . Kidney disease Mother        Kidney Failure  . Heart disease Father        MI    No Known Allergies  Current Outpatient Medications on File Prior to Visit  Medication Sig Dispense Refill  . amitriptyline (ELAVIL) 25 MG tablet TAKE 1 TABLET BY MOUTH AT BEDTIME 30 tablet 0  . Calcium Carbonate-Vit D-Min (CALCIUM 1200 PO) Take by mouth.    . dapagliflozin propanediol (FARXIGA) 10 MG TABS tablet Take 10 mg by mouth daily. 30 tablet 2  . EUTHYROX 125 MCG tablet TAKE 1 TABLET BY MOUTH ONCE DAILY BEFORE BREAKFAST 90 tablet 0  . ferrous sulfate 325 (65 FE) MG tablet Take 1 tablet by mouth daily.    . fluticasone (FLONASE) 50 MCG/ACT nasal  spray Place 2 sprays into both nostrils daily. 16 g 2  . glyBURIDE (DIABETA) 5 MG tablet Take 1 tablet by mouth once daily with breakfast 30 tablet 0  . lisinopril-hydrochlorothiazide (ZESTORETIC) 20-12.5 MG tablet Take 1 tablet by mouth once daily 90 tablet 0  . meloxicam (MOBIC) 15 MG tablet Take 1 tablet by mouth once daily 30 tablet 0  . metFORMIN (GLUCOPHAGE) 500 MG tablet TAKE 1 TABLET BY MOUTH TWICE DAILY WITH MEALS 180 tablet 0  . Omega-3 Fatty Acids (FISH OIL) 1000 MG CAPS Take by mouth.    . pioglitazone (ACTOS) 45 MG tablet Take 1 tablet (45 mg total) by mouth daily. 30 tablet 3  . pregabalin (LYRICA) 75 MG capsule TAKE 2 CAPSULES BY MOUTH ONCE DAILY IN THE MORNING AND 1 CAPSULE ONCE DAILY IN THE EVENING 90 capsule 5   No current facility-administered medications on file prior to visit.    BP (!) 151/95 (BP Location: Right Arm, Patient Position: Sitting, Cuff Size: Small)   Pulse 69   Temp 97.8 F  (36.6 C) (Oral)   Resp 16   Ht 5\' 2"  (1.575 m)   Wt 172 lb (78 kg)   SpO2 99%   BMI 31.46 kg/m       Objective:   Physical Exam Constitutional:      Appearance: She is well-developed.     Comments: Patient appears uncomfortable/holding her left breast  Pulmonary:     Effort: Pulmonary effort is normal. No respiratory distress.     Breath sounds: No wheezing.  Neurological:     Mental Status: She is alert.  Psychiatric:        Behavior: Behavior normal.        Thought Content: Thought content normal.        Judgment: Judgment normal.   Breast:  L breast- abscess noted on underside of breast at 6 oclock.  + central fluctuance and extreme tenderness with surrounding induration.        Assessment & Plan:  Breast Abscess- new. Will rx with Keflex, advised warm compresses bid.  Will rx hydrocodone prn pain since she is so uncomfortable.  I did call over to [facility] to see if they could work her in for I and D today, however, the stated that their protocol is that pt needs breast [facility] with aspiration before they will see the patient. I have placed order for breast US/aspiration at the [facility] and we will contact their office to work her in ASAP.  She understands that should her symptoms worsen over the weekend then she is to go to the ED.    30 minutes spent on today's visit. The majority of the time was spent coordinating her care.   This visit occurred during the SARS-CoV-2 public health emergency.  Safety protocols were in place, including screening questions prior to the visit, additional usage of staff PPE, and extensive cleaning of exam room while observing appropriate contact time as indicated for disinfecting solutions.

## 2020-08-03 NOTE — Patient Instructions (Addendum)
Please begin antibiotic. Apply moist warm compresses twice daily.  We will work on getting you set up at the breast center for ultrasound/aspiriation. Go to the ER if increased pain/swelling/redness.

## 2020-08-06 ENCOUNTER — Encounter: Payer: Self-pay | Admitting: Family

## 2020-08-06 DIAGNOSIS — N611 Abscess of the breast and nipple: Secondary | ICD-10-CM | POA: Diagnosis not present

## 2020-08-06 DIAGNOSIS — N644 Mastodynia: Secondary | ICD-10-CM | POA: Diagnosis not present

## 2020-08-10 ENCOUNTER — Telehealth: Payer: Self-pay | Admitting: Family

## 2020-08-10 MED ORDER — CEPHALEXIN 500 MG PO CAPS: 500.0000 mg | ORAL_CAPSULE | Freq: Four times a day (QID) | ORAL | 0 refills | Status: DC

## 2020-08-10 NOTE — Telephone Encounter (Signed)
I have sent in 3 more days, I will see her Monday.

## 2020-08-10 NOTE — Telephone Encounter (Signed)
Called the patient informed results/instructions. She stated [facility] called her today and stated she would need another prescription of antibiotic. She still has redness//drainage//lump. Send prescription to [facility] in [place]

## 2020-08-10 NOTE — Telephone Encounter (Signed)
Scheduled

## 2020-08-10 NOTE — Telephone Encounter (Signed)
Called left message to call back 

## 2020-08-10 NOTE — Telephone Encounter (Signed)
Could you please contact pt and let her know that I reviewed the results from her testing at [facility].  I would recommend that she schedule follow up with Dr. Carmelia Roller when she completes her antibiotics to determine if she needs further treatment.

## 2020-08-13 ENCOUNTER — Ambulatory Visit (INDEPENDENT_AMBULATORY_CARE_PROVIDER_SITE_OTHER): Payer: BC Managed Care – PPO | Admitting: Family Medicine

## 2020-08-13 ENCOUNTER — Encounter: Payer: Self-pay | Admitting: Family Medicine

## 2020-08-13 ENCOUNTER — Other Ambulatory Visit: Payer: Self-pay

## 2020-08-13 ENCOUNTER — Other Ambulatory Visit: Payer: Self-pay | Admitting: Family Medicine

## 2020-08-13 VITALS — BP 130/82 | HR 88 | Temp 98.0°F | Ht 62.0 in | Wt 170.4 lb

## 2020-08-13 DIAGNOSIS — E119 Type 2 diabetes mellitus without complications: Secondary | ICD-10-CM

## 2020-08-13 DIAGNOSIS — E669 Obesity, unspecified: Secondary | ICD-10-CM

## 2020-08-13 DIAGNOSIS — N611 Abscess of the breast and nipple: Secondary | ICD-10-CM

## 2020-08-13 DIAGNOSIS — E1169 Type 2 diabetes mellitus with other specified complication: Secondary | ICD-10-CM

## 2020-08-13 MED ORDER — TRAMADOL HCL 50 MG PO TABS: 50.0000 mg | ORAL_TABLET | Freq: Three times a day (TID) | ORAL | 0 refills | Status: DC | PRN

## 2020-08-13 MED ORDER — MELOXICAM 15 MG PO TABS: 15.0000 mg | ORAL_TABLET | Freq: Every day | ORAL | 0 refills | Status: DC

## 2020-08-13 MED ORDER — PREGABALIN 75 MG PO CAPS: 75.0000 mg | ORAL_CAPSULE | Freq: Every day | ORAL | 2 refills | Status: DC

## 2020-08-13 NOTE — Progress Notes (Signed)
Chief Complaint  Patient presents with  . Follow-up    Breast Ultra sound    Lindsey Owen is a 54 y.o. female here for a skin complaint.  Duration: 2 weeks Location: L breast Pruritic? No Painful? No Drainage? Yes New soaps/lotions/topicals/detergents? No Sick contacts? No Other associated symptoms: Feels hard underneath Therapies tried thus far: 10 d of Keflex, warm compresses  Patient has a history of diabetes.  She also has neuropathy which she takes Lyrica for.  She has been taking 75 mg nightly.  No adverse effects and she reports compliance.  This does adequately control her symptoms.  She is requesting a refill.  Past Medical History:  Diagnosis Date  . Diabetes mellitus without complication (HCC)   . Hypertension   . Thyroid disease     BP 130/82 (BP Location: Left Arm, Patient Position: Sitting, Cuff Size: Normal)   Pulse 88   Temp 98 F (36.7 C) (Oral)   Ht 5\' 2"  (1.575 m)   Wt 170 lb 6 oz (77.3 kg)   SpO2 99%   BMI 31.16 kg/m  Gen: awake, alert, appearing stated age Lungs: No accessory muscle use Skin: The patient was examined in front of a female chaperone.  On the left lower outer quadrant of the breast, there is an elliptically shaped area of induration with an overlying area of fluctuance with tenderness to palpation.  There is no excessive warmth or active drainage.  No erythema or excoriation. Psych: Age appropriate judgment and insight  Left breast abscess - Plan: meloxicam (MOBIC) 15 MG tablet, traMADol (ULTRAM) 50 MG tablet  Controlled type 2 diabetes mellitus without complication, without long-term current use of insulin (HCC) - Plan: pregabalin (LYRICA) 75 MG capsule  1.  Start meloxicam daily with tramadol for breakthrough pain.  Continue warm compresses, ice for inflammation.  If she is not improved by the end of the week, I will have her return for an incision and drainage procedure.  I do not think she needs any more antibiotic therapy. 2.  We  will continue Lyrica 75 mg nightly.  I would hold off on adding gabapentin if she is already on Lyrica. F/u as originally scheduled. The patient voiced understanding and agreement to the plan.  [place], DO 08/13/20 11:50 AM

## 2020-08-13 NOTE — Patient Instructions (Addendum)
Continue the warm compresses.   Ice/cold pack over area for 10-15 min twice daily.  OK to take Tylenol 1000 mg (2 extra strength tabs) or 975 mg (3 regular strength tabs) every 6 hours as needed.  Use the Mobic daily.  Use tramadol as needed for breakthrough pain.  Do not drink alcohol, do any illicit/street drugs, drive or do anything that requires alertness while on this medicine.   I will see you later in the week to open this up if still having issues.  Let us know if you need anything.

## 2020-09-07 ENCOUNTER — Encounter: Payer: Self-pay | Admitting: Family

## 2020-09-07 DIAGNOSIS — N611 Abscess of the breast and nipple: Secondary | ICD-10-CM | POA: Diagnosis not present

## 2020-09-07 LAB — HM MAMMOGRAPHY: HM Mammogram: NORMAL (ref 0–4)

## 2020-09-26 ENCOUNTER — Telehealth: Payer: Self-pay | Admitting: Family Medicine

## 2020-09-26 NOTE — Telephone Encounter (Signed)
Called the patient and she had received an automotive call stating she was due to get her A1c done. Gave her PCP response to request.  She is good to wait later on to check, she had never received a call like that before and was just wondering if she was due and thought she was not.

## 2020-09-26 NOTE — Telephone Encounter (Signed)
Patient called and would like to get A1c scheduled and done on Friday 12.10.2021    Please advise

## 2020-09-26 NOTE — Telephone Encounter (Signed)
Happy to order, but I would wait until after 12/25 as it should be 90+ days before last one otherwise ins might not cover. Ty.

## 2020-11-21 ENCOUNTER — Other Ambulatory Visit: Payer: Self-pay | Admitting: Family Medicine

## 2020-11-21 DIAGNOSIS — N611 Abscess of the breast and nipple: Secondary | ICD-10-CM

## 2020-12-14 ENCOUNTER — Encounter: Payer: Self-pay | Admitting: Family Medicine

## 2020-12-14 ENCOUNTER — Other Ambulatory Visit: Payer: Self-pay

## 2020-12-14 ENCOUNTER — Ambulatory Visit (INDEPENDENT_AMBULATORY_CARE_PROVIDER_SITE_OTHER): Payer: BC Managed Care – PPO | Admitting: Family Medicine

## 2020-12-14 VITALS — BP 118/72 | HR 71 | Temp 97.9°F | Ht 63.0 in | Wt 171.0 lb

## 2020-12-14 DIAGNOSIS — E039 Hypothyroidism, unspecified: Secondary | ICD-10-CM

## 2020-12-14 DIAGNOSIS — E1169 Type 2 diabetes mellitus with other specified complication: Secondary | ICD-10-CM

## 2020-12-14 DIAGNOSIS — B353 Tinea pedis: Secondary | ICD-10-CM

## 2020-12-14 DIAGNOSIS — Z1331 Encounter for screening for depression: Secondary | ICD-10-CM

## 2020-12-14 DIAGNOSIS — Z0001 Encounter for general adult medical examination with abnormal findings: Secondary | ICD-10-CM | POA: Diagnosis not present

## 2020-12-14 DIAGNOSIS — L659 Nonscarring hair loss, unspecified: Secondary | ICD-10-CM | POA: Diagnosis not present

## 2020-12-14 DIAGNOSIS — M5136 Other intervertebral disc degeneration, lumbar region: Secondary | ICD-10-CM

## 2020-12-14 DIAGNOSIS — I10 Essential (primary) hypertension: Secondary | ICD-10-CM | POA: Diagnosis not present

## 2020-12-14 DIAGNOSIS — E669 Obesity, unspecified: Secondary | ICD-10-CM | POA: Diagnosis not present

## 2020-12-14 LAB — VITAMIN D 25 HYDROXY (VIT D DEFICIENCY, FRACTURES): VITD: 45.12 ng/mL (ref 30.00–100.00)

## 2020-12-14 LAB — LIPID PANEL
Cholesterol: 174 mg/dL (ref 0–200)
HDL: 57.2 mg/dL (ref >39.00)
LDL Cholesterol: 86 mg/dL (ref 0–99)
NonHDL: 116.78
Total CHOL/HDL Ratio: 3
Triglycerides: 153 mg/dL — ABNORMAL HIGH (ref 0.0–149.0)
VLDL: 30.6 mg/dL (ref 0.0–40.0)

## 2020-12-14 LAB — CBC
HCT: 41.9 % (ref 36.0–46.0)
Hemoglobin: 14.2 g/dL (ref 12.0–15.0)
MCHC: 33.9 g/dL (ref 30.0–36.0)
MCV: 86.9 fl (ref 78.0–100.0)
Platelets: 172 10*3/uL (ref 150.0–400.0)
RBC: 4.82 Mil/uL (ref 3.87–5.11)
RDW: 13.3 % (ref 11.5–15.5)
WBC: 6.4 10*3/uL (ref 4.0–10.5)

## 2020-12-14 LAB — COMPREHENSIVE METABOLIC PANEL
ALT: 43 U/L — ABNORMAL HIGH (ref 0–35)
AST: 30 U/L (ref 0–37)
Albumin: 4.1 g/dL (ref 3.5–5.2)
Alkaline Phosphatase: 83 U/L (ref 39–117)
BUN: 12 mg/dL (ref 6–23)
CO2: 34 mEq/L — ABNORMAL HIGH (ref 19–32)
Calcium: 9.5 mg/dL (ref 8.4–10.5)
Chloride: 94 mEq/L — ABNORMAL LOW (ref 96–112)
Creatinine, Ser: 0.71 mg/dL (ref 0.40–1.20)
GFR: 94.73 mL/min (ref >60.00)
Glucose, Bld: 316 mg/dL — ABNORMAL HIGH (ref 70–99)
Potassium: 4.4 mEq/L (ref 3.5–5.1)
Sodium: 133 mEq/L — ABNORMAL LOW (ref 135–145)
Total Bilirubin: 1.2 mg/dL (ref 0.2–1.2)
Total Protein: 7.6 g/dL (ref 6.0–8.3)

## 2020-12-14 LAB — HEMOGLOBIN A1C: Hgb A1c MFr Bld: 12.7 % — ABNORMAL HIGH (ref 4.6–6.5)

## 2020-12-14 LAB — TSH: TSH: 2.06 u[IU]/mL (ref 0.35–4.50)

## 2020-12-14 MED ORDER — AMITRIPTYLINE HCL 25 MG PO TABS
25.0000 mg | ORAL_TABLET | Freq: Every day | ORAL | 2 refills | Status: DC
Start: 2020-12-14 — End: 2021-10-04

## 2020-12-14 MED ORDER — LEVOTHYROXINE SODIUM 125 MCG PO TABS: 125.0000 ug | ORAL_TABLET | Freq: Every day | ORAL | 2 refills | Status: DC

## 2020-12-14 MED ORDER — LISINOPRIL-HYDROCHLOROTHIAZIDE 20-12.5 MG PO TABS: 1.0000 | ORAL_TABLET | Freq: Every day | ORAL | 2 refills | Status: DC

## 2020-12-14 MED ORDER — METFORMIN HCL ER 500 MG PO TB24: 1000.0000 mg | ORAL_TABLET | Freq: Every day | ORAL | 2 refills | Status: DC

## 2020-12-14 MED ORDER — KETOCONAZOLE 2 % EX CREA: 1.0000 "application " | TOPICAL_CREAM | Freq: Every day | CUTANEOUS | 0 refills | Status: AC

## 2020-12-14 NOTE — Progress Notes (Signed)
Chief Complaint  Patient presents with  . Annual Exam     Well Woman Lindsey Owen is here for a complete physical.   Her last physical was >1 year ago.  Current diet: in general, a "fair" diet. Current exercise: none. Weight is stable and she confirms fatigue.  Seatbelt? Yes Loss of interested in doing things or depression in past 2 weeks? No  Health Maintenance Pap/HPV- Yes Mammogram- Yes Colon cancer screening-Yes Shingrix- No Tetanus- Yes Hep C screening- Yes HIV screening- Yes   Hair has been thinning as well as nails. She is on thyroid medication and does not have lab work done in quite some time. She does not eat much due to her work schedule. 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 No new hair products or pulling on her hair/time tight ponytails.  The patient has not been taking her diabetes medication outside of Metformin which does give her abdominal upset.  Past Medical History:  Diagnosis Date  . Diabetes mellitus without complication (HCC)   . Hypertension   . Thyroid disease      Past Surgical History:  Procedure Laterality Date  . CESAREAN SECTION  2000  . UTERINE FIBROID SURGERY      Medications  Current Outpatient Medications on File Prior to Visit  Medication Sig Dispense Refill  . amitriptyline (ELAVIL) 25 MG tablet TAKE 1 TABLET BY MOUTH AT BEDTIME 30 tablet 0  . Calcium Carbonate-Vit D-Min (CALCIUM 1200 PO) Take by mouth.    . dapagliflozin propanediol (FARXIGA) 10 MG TABS tablet Take 10 mg by mouth daily. 30 tablet 2  . EUTHYROX 125 MCG tablet TAKE 1 TABLET BY MOUTH ONCE DAILY BEFORE BREAKFAST 90 tablet 0  . ferrous sulfate 325 (65 FE) MG tablet Take 1 tablet by mouth daily.    . fluticasone (FLONASE) 50 MCG/ACT nasal spray Place 2 sprays into both nostrils daily. 16 g 2  . glyBURIDE (DIABETA) 5 MG tablet Take 1 tablet by mouth once daily with breakfast 30 tablet 0  . lisinopril-hydrochlorothiazide (ZESTORETIC) 20-12.5 MG tablet Take 1  tablet by mouth once daily 90 tablet 0  . meloxicam (MOBIC) 15 MG tablet TAKE 1 TABLET (15 MG TOTAL) BY MOUTH DAILY. 30 tablet 0  . metFORMIN (GLUCOPHAGE) 500 MG tablet TAKE 1 TABLET BY MOUTH TWICE DAILY WITH MEALS 180 tablet 0  . Omega-3 Fatty Acids (FISH OIL) 1000 MG CAPS Take by mouth.    . pioglitazone (ACTOS) 45 MG tablet Take 1 tablet (45 mg total) by mouth daily. 30 tablet 3  . pregabalin (LYRICA) 75 MG capsule Take 1 capsule (75 mg total) by mouth daily. 90 capsule 2   Allergies No Known Allergies  Review of Systems: Constitutional:  no unexpected weight changes Eye:  no recent significant change in vision Ear/Nose/Mouth/Throat:  Ears: ears feel blocked Nose/Mouth/Throat:  no complaints of nasal congestion, no sore throat Cardiovascular: no chest pain Respiratory:  no shortness of breath Gastrointestinal:  no abdominal pain, no change in bowel habits GU:  Female: negative for dysuria or pelvic pain Musculoskeletal/Extremities:  +R side pain Integumentary (Skin/Breast):  +hair thinning Neurologic:  no headaches Endocrine:  +fatigue  Exam BP 118/72 (BP Location: Left Arm, Patient Position: Sitting, Cuff Size: Normal)   Pulse 71   Temp 97.9 F (36.6 C) (Oral)   Ht 5\' 3"  (1.6 m)   Wt 171 lb (77.6 kg)   SpO2 97%   BMI 30.29 kg/m  General:  well developed, well nourished, in  no apparent distress Skin:  no significant moles, warts, or growths; I do not appreciate any patches of alopecia on her scalp; macerated tissue between the third and fourth, fourth and fifth digits of the left foot Head:  no masses, lesions, or tenderness Eyes:  pupils equal and round, sclera anicteric without injection Ears:  canals without lesions, TMs shiny without retraction, no obvious effusion, no erythema Nose:  nares patent, septum midline, mucosa normal, and no drainage or sinus tenderness Throat/Pharynx:  lips and gingiva without lesion; tongue and uvula midline; non-inflamed pharynx; no  exudates or postnasal drainage Neck: neck supple without adenopathy, thyromegaly, or masses Lungs:  clear to auscultation, breath sounds equal bilaterally, no respiratory distress Cardio:  regular rate and rhythm, no LE edema Abdomen:  abdomen soft, nontender; bowel sounds normal; no masses or organomegaly Genital: Defer to GYN Musculoskeletal:  symmetrical muscle groups noted without atrophy or deformity Extremities:  no clubbing, cyanosis, or edema, no deformities, no skin discoloration Neuro:  gait normal; deep tendon reflexes normal and symmetric Psych: well oriented with normal range of affect and appropriate judgment/insight  Assessment and Plan  Encounter for well adult exam with abnormal findings  Diabetes mellitus type 2 in obese (HCC) - Plan: Comprehensive metabolic panel, Lipid panel, Hemoglobin A1c, metFORMIN (GLUCOPHAGE-XR) 500 MG 24 hr tablet  Hair thinning - Plan: CBC, VITAMIN D 25 Hydroxy (Vit-D Deficiency, Fractures)  Essential hypertension - Plan: lisinopril-hydrochlorothiazide (ZESTORETIC) 20-12.5 MG tablet  DDD (degenerative disc disease), lumbar - Plan: amitriptyline (ELAVIL) 25 MG tablet  Hypothyroidism, unspecified type - Plan: TSH, levothyroxine (EUTHYROX) 125 MCG tablet  Depression screening negative  Tinea pedis of left foot - Plan: ketoconazole (NIZORAL) 2 % cream   Well 55 y.o. female. Counseled on diet and exercise. Other orders as above. Ketoconazole cream for the athlete's foot. Hair thinning is likely due to poor sleep/exhaustion, will check above labs. Her diabetes is likely poorly controlled. We will start by adding back her glyburide and maybe [place] after that. Follow up pending above. The patient voiced understanding and agreement to the plan.  Jilda Roche [place], DO 12/14/20 11:38 AM

## 2020-12-14 NOTE — Patient Instructions (Addendum)
Give [place] 2-3 business days to get the results of your labs back.   Keep the diet clean and stay active.  The new Shingrix vaccine (for shingles) is a 2 shot series. It can make people feel low energy, achy and almost like they have the flu for 48 hours after injection. Please plan accordingly when deciding on when to get this shot. Call our office for a nurse visit appointment to get this. The second shot of the series is less severe regarding the side effects, but it still lasts 48 hours.   I recommend the covid booster.   Let us know if you need anything.

## 2020-12-15 ENCOUNTER — Encounter: Payer: Self-pay | Admitting: Family Medicine

## 2020-12-15 ENCOUNTER — Other Ambulatory Visit: Payer: Self-pay | Admitting: Family Medicine

## 2020-12-15 DIAGNOSIS — K760 Fatty (change of) liver, not elsewhere classified: Secondary | ICD-10-CM | POA: Insufficient documentation

## 2020-12-15 MED ORDER — GLYBURIDE 5 MG PO TABS: 5.0000 mg | ORAL_TABLET | Freq: Two times a day (BID) | ORAL | 1 refills | Status: DC

## 2020-12-31 ENCOUNTER — Other Ambulatory Visit: Payer: Self-pay

## 2020-12-31 ENCOUNTER — Telehealth: Payer: BC Managed Care – PPO | Admitting: Family Medicine

## 2021-03-14 DIAGNOSIS — R739 Hyperglycemia, unspecified: Secondary | ICD-10-CM | POA: Diagnosis not present

## 2021-03-14 DIAGNOSIS — U071 COVID-19: Secondary | ICD-10-CM | POA: Diagnosis not present

## 2021-03-14 DIAGNOSIS — Z20822 Contact with and (suspected) exposure to covid-19: Secondary | ICD-10-CM | POA: Diagnosis not present

## 2021-03-14 DIAGNOSIS — E1165 Type 2 diabetes mellitus with hyperglycemia: Secondary | ICD-10-CM | POA: Diagnosis not present

## 2021-03-29 ENCOUNTER — Other Ambulatory Visit: Payer: Self-pay | Admitting: Family Medicine

## 2021-03-29 ENCOUNTER — Other Ambulatory Visit (HOSPITAL_BASED_OUTPATIENT_CLINIC_OR_DEPARTMENT_OTHER): Payer: Self-pay

## 2021-03-29 DIAGNOSIS — E1169 Type 2 diabetes mellitus with other specified complication: Secondary | ICD-10-CM

## 2021-03-29 DIAGNOSIS — N611 Abscess of the breast and nipple: Secondary | ICD-10-CM

## 2021-03-29 MED ORDER — MELOXICAM 15 MG PO TABS
ORAL_TABLET | Freq: Every day | ORAL | 0 refills | Status: DC
  Filled 2021-03-29: qty 30, 30d supply, fill #0

## 2021-03-29 MED ORDER — PREGABALIN 75 MG PO CAPS
ORAL_CAPSULE | ORAL | 2 refills | Status: DC
  Filled 2021-03-29: qty 90, 90d supply, fill #0
  Filled 2021-07-26: qty 90, 90d supply, fill #1
  Filled 2021-10-07: qty 90, 90d supply, fill #2

## 2021-03-29 NOTE — Telephone Encounter (Signed)
Requesting: Lyrica 75mg  Contract: None UDS: None Last Visit: 12/14/2020 Next Visit: None Last Refill: 08/13/2020 #90 and 1RF  Please Advise

## 2021-04-12 ENCOUNTER — Other Ambulatory Visit: Payer: Self-pay

## 2021-04-12 ENCOUNTER — Ambulatory Visit (INDEPENDENT_AMBULATORY_CARE_PROVIDER_SITE_OTHER): Payer: BC Managed Care – PPO | Admitting: Family Medicine

## 2021-04-12 ENCOUNTER — Encounter: Payer: Self-pay | Admitting: Family Medicine

## 2021-04-12 VITALS — BP 118/70 | HR 81 | Temp 98.0°F | Ht 63.0 in | Wt 176.4 lb

## 2021-04-12 DIAGNOSIS — R300 Dysuria: Secondary | ICD-10-CM | POA: Diagnosis not present

## 2021-04-12 LAB — URINALYSIS, ROUTINE W REFLEX MICROSCOPIC
Bilirubin Urine: NEGATIVE
Hgb urine dipstick: NEGATIVE
Ketones, ur: NEGATIVE
Nitrite: NEGATIVE
RBC / HPF: NONE SEEN (ref 0–?)
Specific Gravity, Urine: 1.02 (ref 1.000–1.030)
Total Protein, Urine: NEGATIVE
Urine Glucose: 500 — AB
Urobilinogen, UA: 0.2 (ref 0.0–1.0)
pH: 6 (ref 5.0–8.0)

## 2021-04-12 MED ORDER — FLUCONAZOLE 150 MG PO TABS: ORAL_TABLET | ORAL | 0 refills | Status: DC

## 2021-04-12 MED ORDER — SULFAMETHOXAZOLE-TRIMETHOPRIM 800-160 MG PO TABS: 1.0000 | ORAL_TABLET | Freq: Two times a day (BID) | ORAL | 0 refills | Status: AC

## 2021-04-12 NOTE — Patient Instructions (Signed)
Stay hydrated.   Warning signs/symptoms: Uncontrollable nausea/vomiting, fevers, worsening symptoms despite treatment, confusion.  Give us around 2 business days to get culture back to you.  Let us know if you need anything. 

## 2021-04-12 NOTE — Progress Notes (Signed)
Chief Complaint  Patient presents with   Back Pain   Cough   Dysuria    Lindsey Owen is a 55 y.o. female here for possible UTI.  Duration: 2 months. Symptoms: Dysuria, urinary frequency, hematuria, urinary hesitancy, urinary retention, flank pain on  both sides , and urgency. Denies: fever, nausea, vomiting, and urinary incontinence, vaginal discharge Hx of recurrent UTI? No Denies new sexual partners.  Past Medical History:  Diagnosis Date   Diabetes mellitus without complication (HCC)    Hypertension    NAFLD (nonalcoholic fatty liver disease)    Thyroid disease      BP 118/70   Pulse 81   Temp 98 F (36.7 C) (Oral)   Ht 5\' 3"  (1.6 m)   Wt 176 lb 6 oz (80 kg)   SpO2 99%   BMI 31.24 kg/m  General: Awake, alert, appears stated age Heart: RRR Lungs: CTAB, normal respiratory effort, no accessory muscle usage Abd: BS+, soft, +suprapubic ttp, ND, no masses or organomegaly MSK: No CVA tenderness, neg Lloyd's sign Psych: Age appropriate judgment and insight  Dysuria  7 d bid Bactrim. Diflucan prn. Doubt pyelo but will tx longer given duration of s/s's.  Stay hydrated. Seek immediate care if pt starts to develop fevers, new/worsening symptoms, uncontrollable N/V. F/u prn. The patient voiced understanding and agreement to the plan.  [place], DO 04/12/21 1:46 PM

## 2021-04-12 NOTE — Addendum Note (Signed)
Addended by: Scharlene Gloss B on: 04/12/2021 01:47 PM   Modules accepted: Orders

## 2021-04-14 LAB — URINE CULTURE
MICRO NUMBER:: 12047776
SPECIMEN QUALITY:: ADEQUATE

## 2021-05-10 ENCOUNTER — Other Ambulatory Visit: Payer: Self-pay

## 2021-05-10 ENCOUNTER — Ambulatory Visit (INDEPENDENT_AMBULATORY_CARE_PROVIDER_SITE_OTHER): Payer: BC Managed Care – PPO | Admitting: Family Medicine

## 2021-05-10 ENCOUNTER — Encounter: Payer: Self-pay | Admitting: Family Medicine

## 2021-05-10 VITALS — BP 124/84 | HR 72 | Temp 99.0°F | Ht 63.0 in | Wt 179.4 lb

## 2021-05-10 DIAGNOSIS — R432 Parageusia: Secondary | ICD-10-CM | POA: Diagnosis not present

## 2021-05-10 DIAGNOSIS — M545 Low back pain, unspecified: Secondary | ICD-10-CM | POA: Diagnosis not present

## 2021-05-10 DIAGNOSIS — M533 Sacrococcygeal disorders, not elsewhere classified: Secondary | ICD-10-CM

## 2021-05-10 DIAGNOSIS — G8929 Other chronic pain: Secondary | ICD-10-CM | POA: Diagnosis not present

## 2021-05-10 MED ORDER — METHYLPREDNISOLONE ACETATE 40 MG/ML IJ SUSP
40.0000 mg | Freq: Once | INTRAMUSCULAR | Status: AC
  Administered 2021-05-10: 40 mg via INTRA_ARTICULAR

## 2021-05-10 NOTE — Progress Notes (Signed)
Chief Complaint  Patient presents with   smell problem   Back Pain    Subjective: Patient is a 55 y.o. female here for taste issue.  Over the past 3 mo, has been having a sweet taste followed by a medicinal/bitter smell. She tried Flonase for >2 mo without relief. This taste/smell is not appreciated by other people. No fevers, URI s/s's, headaches. She has N/V that resolves after the sensations resolved. It lasts a few minutes to an hour.  Happens daily, multiple times per day. It lasted for the whole day when she was driving. No specific triggers.   Patient has a history of chronic low back pain.  She was given home stretches and exercises which she is unable to do routinely due to her strenuous work hours.  She is also unable to see physical therapy.  She has been using Tylenol and heat with some relief.  She used to get massages which helped, but her masseuse moved away and she has been not getting the service lately.  No new neurologic signs or symptoms.  Past Medical History:  Diagnosis Date   Diabetes mellitus without complication (HCC)    Hypertension    NAFLD (nonalcoholic fatty liver disease)    Thyroid disease     Objective: BP 124/84   Pulse 72   Temp 99 F (37.2 C) (Oral)   Ht 5\' 3"  (1.6 m)   Wt 179 lb 6 oz (81.4 kg)   SpO2 97%   BMI 31.77 kg/m  General: Awake, appears stated age HEENT: MMM, EOMi, ears negative bilaterally, nares are patent without discharge Neuro: Gait is antalgic, DTRs equal and symmetric throughout, no clonus, no cerebellar signs MSK: Tender over bilateral SI joints and lumbar paraspinal musculature bilaterally Lungs: CTAB, no rales, wheezes or rhonchi. No accessory muscle use Psych: Age appropriate judgment and insight, normal affect and mood  Procedure note; R SI joint injection Verbal consent obtained. The PSIS's were palpated and demarcated with an otoscope speculum tip just medial to the side of interest. The area was cleaned with  alcohol. The joint was entered and 40 mg Depo-Medrol with 2 mL of 1% lidocaine was injected. The area was then bandaged. There were no complications noted.  The patient tolerated the procedure well.  Assessment and Plan: Dysgeusia - Plan: Ambulatory referral to ENT  Chronic SI joint pain - Plan: methylPREDNISolone acetate (DEPO-MEDROL) injection 40 mg, Inject trigger point, 1 or 2  Chronic bilateral low back pain without sciatica  Chronic, uncontrolled.  She has failed intranasal corticosteroids.  Will refer to ENT for further evaluation.  Does not seem like a migraine or chronic sinusitis.  Doubt allergies.  Would refer to neurology if ENT work-up is unremarkable. Injection today.  Do stretches and exercises when able. Standing offer for PT referral given. The patient voiced understanding and agreement to the plan.  [place], DO 05/10/21  4:35 PM

## 2021-05-10 NOTE — Patient Instructions (Addendum)
Ice/cold pack over area for 10-15 min twice daily.  Heat (pad or rice pillow in microwave) over affected area, 10-15 minutes twice daily.   OK to take Tylenol 1000 mg (2 extra strength tabs) or 975 mg (3 regular strength tabs) every 6 hours as needed.  Use your stretches/exercises.   If you ever want physical therapy, let me know.   If you do not hear anything about your referral in the next 1-2 weeks, call our office and ask for an update.=  Let us know if you need anything.

## 2021-05-23 DIAGNOSIS — L299 Pruritus, unspecified: Secondary | ICD-10-CM | POA: Diagnosis not present

## 2021-05-23 DIAGNOSIS — R439 Unspecified disturbances of smell and taste: Secondary | ICD-10-CM | POA: Diagnosis not present

## 2021-05-23 DIAGNOSIS — H93293 Other abnormal auditory perceptions, bilateral: Secondary | ICD-10-CM | POA: Diagnosis not present

## 2021-05-24 DIAGNOSIS — R439 Unspecified disturbances of smell and taste: Secondary | ICD-10-CM | POA: Diagnosis not present

## 2021-05-24 DIAGNOSIS — R432 Parageusia: Secondary | ICD-10-CM | POA: Diagnosis not present

## 2021-05-24 DIAGNOSIS — H903 Sensorineural hearing loss, bilateral: Secondary | ICD-10-CM | POA: Diagnosis not present

## 2021-05-31 ENCOUNTER — Encounter: Payer: Self-pay | Admitting: Family Medicine

## 2021-05-31 ENCOUNTER — Ambulatory Visit (INDEPENDENT_AMBULATORY_CARE_PROVIDER_SITE_OTHER): Payer: BC Managed Care – PPO | Admitting: Family Medicine

## 2021-05-31 ENCOUNTER — Other Ambulatory Visit: Payer: Self-pay

## 2021-05-31 VITALS — BP 122/86 | HR 77 | Temp 98.3°F | Ht 63.0 in | Wt 174.4 lb

## 2021-05-31 DIAGNOSIS — L0291 Cutaneous abscess, unspecified: Secondary | ICD-10-CM | POA: Diagnosis not present

## 2021-05-31 MED ORDER — TRAMADOL HCL 50 MG PO TABS
50.0000 mg | ORAL_TABLET | Freq: Three times a day (TID) | ORAL | 0 refills | Status: AC | PRN
Start: 2021-05-31 — End: 2021-06-05

## 2021-05-31 MED ORDER — DOXYCYCLINE HYCLATE 100 MG PO TABS: 100.0000 mg | ORAL_TABLET | Freq: Two times a day (BID) | ORAL | 0 refills | Status: AC

## 2021-05-31 NOTE — Patient Instructions (Signed)
Ice/cold pack over area for 10-15 min twice daily.  OK to take Tylenol 1000 mg (2 extra strength tabs) or 975 mg (3 regular strength tabs) every 6 hours as needed.  Ibuprofen 400-600 mg (2-3 over the counter strength tabs) every 6 hours as needed for pain.  Do not drink alcohol, do any illicit/street drugs, drive or do anything that requires alertness while on this medicine.   Do not shower for the rest of the day. When you do wash it, use only soap and water. Do not vigorously scrub. Apply triple antibiotic ointment (like Neosporin) twice daily. Keep the area clean and dry.   Things to look out for: increasing pain not relieved by ibuprofen/acetaminophen, fevers, spreading redness, drainage of pus, or foul odor.  Let us know if you need anything.

## 2021-05-31 NOTE — Progress Notes (Signed)
Chief Complaint  Patient presents with   cyst under left breast    Lindsey Owen is a 55 y.o. female here for a skin complaint.  Duration: 5 days Location: under/side of L breast Pruritic? No Painful? Yes Drainage? No New soaps/lotions/topicals/detergents? No Sick contacts? No Other associated symptoms: getting bigger, feels warm, unsure about redness Denies fevers.  Therapies tried thus far: warm compresses  Past Medical History:  Diagnosis Date   Diabetes mellitus without complication (HCC)    Hypertension    NAFLD (nonalcoholic fatty liver disease)    Thyroid disease     BP 122/86   Pulse 77   Temp 98.3 F (36.8 C) (Oral)   Ht 5\' 3"  (1.6 m)   Wt 174 lb 6 oz (79.1 kg)   SpO2 98%   BMI 30.89 kg/m  Gen: awake, alert, appearing stated age Lungs: No accessory muscle use Skin: Examined in the presence of a female chaperone.  Under the left lateral breast, there is an elliptically shaped raised lesion that is erythematous, very tender to palpation, and warm.  There is fluctuance.  There appears to be purulent material under a center opening without active drainage. Psych: Age appropriate judgment and insight  Procedure note; incision and drainage Informed consent obtained. The area was cleaned with Hibiclens. The area was anesthetized with 5 mL of 1% lidocaine with epinephrine. Once adequate anesthesia was obtained, a cruciate incision was made with 11 blade scalpel. Approximately 10 mL of purulent material with blood was expressed. Cultures were taken. Loculations were interrupted with a curved hemostat. The area was packed with approximately 6 cm of 0.25 in iodoform gauze. The area was then dressed with gauze. There were no complications noted. The patient tolerated the procedure well.  Abscess - Plan: doxycycline (VIBRA-TABS) 100 MG tablet, traMADol (ULTRAM) 50 MG tablet, PR DRAIN SKIN ABSCESS SIMPLE  7 d doxy, tramadol prn, ice, warm compresses, Tylenol,  ibuprofen. Warning signs and symptoms verbalized and written down in AVS.  F/u in 1 week to pull packing.  The patient voiced understanding and agreement to the plan.  [place], DO 05/31/21 11:54 AM

## 2021-06-05 ENCOUNTER — Ambulatory Visit: Payer: BC Managed Care – PPO | Admitting: Family Medicine

## 2021-06-07 ENCOUNTER — Encounter: Payer: Self-pay | Admitting: Family

## 2021-06-07 ENCOUNTER — Ambulatory Visit (INDEPENDENT_AMBULATORY_CARE_PROVIDER_SITE_OTHER): Payer: BC Managed Care – PPO | Admitting: Family

## 2021-06-07 ENCOUNTER — Other Ambulatory Visit: Payer: Self-pay

## 2021-06-07 VITALS — BP 154/98 | HR 89 | Temp 97.3°F | Ht 63.0 in | Wt 173.0 lb

## 2021-06-07 DIAGNOSIS — B029 Zoster without complications: Secondary | ICD-10-CM | POA: Diagnosis not present

## 2021-06-07 MED ORDER — VALACYCLOVIR HCL 1 G PO TABS: 1000.0000 mg | ORAL_TABLET | Freq: Three times a day (TID) | ORAL | 0 refills | Status: AC

## 2021-06-07 MED ORDER — PREDNISONE 10 MG PO TABS: 10.0000 mg | ORAL_TABLET | Freq: Every day | ORAL | 0 refills | Status: DC

## 2021-06-07 NOTE — Progress Notes (Signed)
Lindsey Owen is a 55 y.o. female with the following history as recorded in [employer]:  Patient Active Problem List   Diagnosis Date Noted   Chronic SI joint pain 05/10/2021   NAFLD (nonalcoholic fatty liver disease)    DDD (degenerative disc disease), lumbar 02/27/2020   Chronic bilateral low back pain without sciatica 09/09/2019   Arthritis 06/01/2019   Diabetes mellitus type 2 in obese (HCC) 09/10/2018   Essential hypertension 03/19/2015   Hypothyroidism 03/19/2015    Current Outpatient Medications  Medication Sig Dispense Refill   amitriptyline (ELAVIL) 25 MG tablet Take 1 tablet (25 mg total) by mouth at bedtime. 90 tablet 2   Calcium Carbonate-Vit D-Min (CALCIUM 1200 PO) Take by mouth.     doxycycline (VIBRA-TABS) 100 MG tablet Take 1 tablet (100 mg total) by mouth 2 (two) times daily for 7 days. 14 tablet 0   ferrous sulfate 325 (65 FE) MG tablet Take 1 tablet by mouth daily.     fluticasone (FLONASE) 50 MCG/ACT nasal spray Place 2 sprays into both nostrils daily. 16 g 2   glyBURIDE (DIABETA) 5 MG tablet Take 1 tablet (5 mg total) by mouth 2 (two) times daily with a meal. 60 tablet 1   levothyroxine (EUTHYROX) 125 MCG tablet Take 1 tablet (125 mcg total) by mouth daily before breakfast. 90 tablet 2   lisinopril-hydrochlorothiazide (ZESTORETIC) 20-12.5 MG tablet Take 1 tablet by mouth daily. 90 tablet 2   meloxicam (MOBIC) 15 MG tablet TAKE 1 TABLET BY MOUTH ONCE DAILY 30 tablet 0   metFORMIN (GLUCOPHAGE-XR) 500 MG 24 hr tablet Take 2 tablets (1,000 mg total) by mouth daily with breakfast. 60 tablet 2   Omega-3 Fatty Acids (FISH OIL) 1000 MG CAPS Take by mouth.     predniSONE (DELTASONE) 10 MG tablet Take 1 tablet (10 mg total) by mouth daily with breakfast. 5 tablet 0   pregabalin (LYRICA) 75 MG capsule TAKE 1 CAPSULE (75 MG TOTAL) BY MOUTH DAILY. 90 capsule 2   valACYclovir (VALTREX) 1000 MG tablet Take 1 tablet (1,000 mg total) by mouth 3 (three) times daily for 7 days. 21  tablet 0   No current facility-administered medications for this visit.    Allergies: Patient has no known allergies.  Past Medical History:  Diagnosis Date   Diabetes mellitus without complication (HCC)    Hypertension    NAFLD (nonalcoholic fatty liver disease)    Thyroid disease     Past Surgical History:  Procedure Laterality Date   CESAREAN SECTION  2000   UTERINE FIBROID SURGERY      Family History  Problem Relation Age of Onset   Kidney disease Mother        Kidney Failure   Heart disease Father        MI    Social History   Tobacco Use   Smoking status: Never   Smokeless tobacco: Never  Substance Use Topics   Alcohol use: Never    Subjective:  Rash on left upper extremity x 1 week; was started on Doxycycline last week for abscess and is concerned she could be having a allergic reaction to antibiotic;  Notes that she has recently taken her COVID booster;       Objective:  Vitals:   06/07/21 1354  BP: (!) 154/98  Pulse: 89  Temp: (!) 97.3 F (36.3 C)  TempSrc: Oral  SpO2: 98%  Weight: 173 lb (78.5 kg)  Height: 5\' 3"  (1.6 m)    General: Well  developed, well nourished, in no acute distress  Skin : Warm and dry. Erythematous vesicular lesions noted on left side of upper left shoulder/ left antecubetal space/ left wrist;  Head: Normocephalic and atraumatic  Eyes: Sclera and conjunctiva clear; pupils round and reactive to light; extraocular movements intact  Ears: External normal; canals clear; tympanic membranes normal  Oropharynx: Pink, supple. No suspicious lesions  Neck: Supple without thyromegaly, adenopathy  Lungs: Respirations unlabored;  Neurologic: Alert and oriented; speech intact; face symmetrical; moves all extremities well; CNII-XII intact without focal deficit   Assessment:  1. Herpes zoster without complication     Plan:  Reassurance- suspect secondary to the recent COVID booster; Rx for Valtrex 1 gm tid x 7 days, prednisone 10 mg  daily x 5 days;  Follow up worse, no better.  This visit occurred during the SARS-CoV-2 public health emergency.  Safety protocols were in place, including screening questions prior to the visit, additional usage of staff PPE, and extensive cleaning of exam room while observing appropriate contact time as indicated for disinfecting solutions.    No follow-ups on file.  No orders of the defined types were placed in this encounter.   Requested Prescriptions   Signed Prescriptions Disp Refills   valACYclovir (VALTREX) 1000 MG tablet 21 tablet 0    Sig: Take 1 tablet (1,000 mg total) by mouth 3 (three) times daily for 7 days.   predniSONE (DELTASONE) 10 MG tablet 5 tablet 0    Sig: Take 1 tablet (10 mg total) by mouth daily with breakfast.

## 2021-06-07 NOTE — Patient Instructions (Signed)

## 2021-06-10 ENCOUNTER — Other Ambulatory Visit: Payer: Self-pay | Admitting: Family

## 2021-07-26 ENCOUNTER — Other Ambulatory Visit: Payer: Self-pay | Admitting: Family Medicine

## 2021-07-26 ENCOUNTER — Other Ambulatory Visit (HOSPITAL_BASED_OUTPATIENT_CLINIC_OR_DEPARTMENT_OTHER): Payer: Self-pay

## 2021-07-26 DIAGNOSIS — N611 Abscess of the breast and nipple: Secondary | ICD-10-CM

## 2021-07-26 MED ORDER — MELOXICAM 15 MG PO TABS
ORAL_TABLET | Freq: Every day | ORAL | 0 refills | Status: DC
  Filled 2021-07-26: qty 30, 30d supply, fill #0

## 2021-07-30 ENCOUNTER — Other Ambulatory Visit: Payer: Self-pay | Admitting: Family Medicine

## 2021-07-30 DIAGNOSIS — E669 Obesity, unspecified: Secondary | ICD-10-CM

## 2021-07-30 DIAGNOSIS — E1169 Type 2 diabetes mellitus with other specified complication: Secondary | ICD-10-CM

## 2021-08-23 LAB — HM DIABETES EYE EXAM

## 2021-08-26 ENCOUNTER — Encounter: Payer: Self-pay | Admitting: Family Medicine

## 2021-08-29 ENCOUNTER — Other Ambulatory Visit: Payer: Self-pay

## 2021-08-29 ENCOUNTER — Encounter: Payer: Self-pay | Admitting: Neurology

## 2021-08-29 ENCOUNTER — Ambulatory Visit (INDEPENDENT_AMBULATORY_CARE_PROVIDER_SITE_OTHER): Payer: BC Managed Care – PPO | Admitting: Neurology

## 2021-08-29 VITALS — BP 138/92 | HR 74 | Ht 62.0 in | Wt 172.5 lb

## 2021-08-29 DIAGNOSIS — H6983 Other specified disorders of Eustachian tube, bilateral: Secondary | ICD-10-CM | POA: Diagnosis not present

## 2021-08-29 DIAGNOSIS — R442 Other hallucinations: Secondary | ICD-10-CM | POA: Diagnosis not present

## 2021-08-29 DIAGNOSIS — R431 Parosmia: Secondary | ICD-10-CM

## 2021-08-29 DIAGNOSIS — T50B95A Adverse effect of other viral vaccines, initial encounter: Secondary | ICD-10-CM | POA: Diagnosis not present

## 2021-08-29 MED ORDER — FLUTICASONE PROPIONATE 50 MCG/ACT NA SUSP: 2.0000 | Freq: Every day | NASAL | 2 refills | Status: DC

## 2021-08-29 MED ORDER — ALPRAZOLAM 1 MG PO TABS: 1.0000 mg | ORAL_TABLET | Freq: Every evening | ORAL | 0 refills | Status: DC | PRN

## 2021-08-29 NOTE — Progress Notes (Addendum)
Provider:  Melvyn Novas, MD  Primary Care Physician:  Sharlene Dory, DO [address] [place] [address]     Referring Provider: Aquilla Hacker, Pa-c [address] [place],  [place] [address]          Chief Complaint according to patient   Patient presents with:     New Patient (Initial Visit)           HISTORY OF PRESENT ILLNESS:  Lindsey Owen is a 55 y.o. year old Asian female patient seen here as a referral on 08/29/2021 from [place] for a parosmia.. June 2022 had Covid , had not had a booster yet- runny nose, coughing, tested positive. Chief concern according to patient :  variable intensity of olfactory hallucinations , onset after several days post third booster shot, [employer],  about July 15s, and onset in August. This affected a trip to [place].    I have the pleasure of seeing Lindsey Owen today, a right-handed [place] female with a possible sleep disorder.  She  has a past medical history of Diabetes mellitus without complication (HCC), Hypertension, NAFLD (nonalcoholic fatty liver disease), and Thyroid disease.   Social history: Patient is working as an Network engineer / Sports administrator, Psychologist, sport and exercise. In [place], [place].  She lives in a household with spouse,   grown kids, none at home.  The patient currently works/ used to work in shifts( night/ rotating,) Tobacco use: none .  ETOH use none, Caffeine intake in form of Coffee( 1 cup ) Soda( /) Tea ( 1-2 a day) or energy drinks. Hobbies :cooking.       Review of Systems: Out of a complete 14 system review, the patient complains of only the following symptoms, and all other reviewed systems are negative.   Parosmia, olfactory hallucinations/ anosmia  Social History   Socioeconomic History   Marital status: Married    Spouse name: nayin   Number of children: 4   Years of education: Not on file   Highest education level: Not on file  Occupational History    Not on file  Tobacco Use   Smoking status: Never   Smokeless tobacco: Never  Substance and Sexual Activity   Alcohol use: Never   Drug use: No   Sexual activity: Not on file  Other Topics Concern   Not on file  Social History Narrative   Lives at home with husband and 4 children   Right handed   Caffeine: 1 cup of coffee in the AM   Social Determinants of Health   Financial Resource Strain: Not on file  Food Insecurity: Not on file  Transportation Needs: Not on file  Physical Activity: Not on file  Stress: Not on file  Social Connections: Not on file    Family History  Problem Relation Age of Onset   Kidney disease Mother        Kidney Failure   Heart disease Father        MI    Past Medical History:  Diagnosis Date   Diabetes mellitus without complication (HCC)    Hypertension    NAFLD (nonalcoholic fatty liver disease)    Thyroid disease     Past Surgical History:  Procedure Laterality Date   CESAREAN SECTION  2000   UTERINE FIBROID SURGERY       Current Outpatient Medications on File Prior to Visit  Medication Sig Dispense  Refill   amitriptyline (ELAVIL) 25 MG tablet Take 1 tablet (25 mg total) by mouth at bedtime. 90 tablet 2   Calcium Carbonate-Vit D-Min (CALCIUM 1200 PO) Take by mouth.     ferrous sulfate 325 (65 FE) MG tablet Take 1 tablet by mouth daily.     fluticasone (FLONASE) 50 MCG/ACT nasal spray Place 2 sprays into both nostrils daily. 16 g 2   glyBURIDE (DIABETA) 5 MG tablet TAKE 1 TABLET BY MOUTH TWICE DAILY WITH A MEAL 60 tablet 0   levothyroxine (EUTHYROX) 125 MCG tablet Take 1 tablet (125 mcg total) by mouth daily before breakfast. 90 tablet 2   lisinopril-hydrochlorothiazide (ZESTORETIC) 20-12.5 MG tablet Take 1 tablet by mouth daily. 90 tablet 2   meloxicam (MOBIC) 15 MG tablet TAKE 1 TABLET BY MOUTH ONCE DAILY 30 tablet 0   metFORMIN (GLUCOPHAGE-XR) 500 MG 24 hr tablet TAKE 2 TABLETS BY MOUTH ONCE DAILY WITH BREAKFAST 60 tablet 0    Omega-3 Fatty Acids (FISH OIL) 1000 MG CAPS Take by mouth.     predniSONE (DELTASONE) 10 MG tablet Take 1 tablet (10 mg total) by mouth daily with breakfast. 5 tablet 0   pregabalin (LYRICA) 75 MG capsule TAKE 1 CAPSULE (75 MG TOTAL) BY MOUTH DAILY. 90 capsule 2   No current facility-administered medications on file prior to visit.    No Known Allergies  Physical exam:  Today's Vitals   08/29/21 1356  BP: (!) 138/92  Pulse: 74  Weight: 172 lb 8 oz (78.2 kg)  Height: 5\' 2"  (1.575 m)   Body mass index is 31.55 kg/m.   Wt Readings from Last 3 Encounters:  08/29/21 172 lb 8 oz (78.2 kg)  06/07/21 173 lb (78.5 kg)  05/31/21 174 lb 6 oz (79.1 kg)     Ht Readings from Last 3 Encounters:  08/29/21 5\' 2"  (1.575 m)  06/07/21 5\' 3"  (1.6 m)  05/31/21 5\' 3"  (1.6 m)      General: The patient is awake, alert and appears not in acute distress. The patient is well groomed. Head: Normocephalic, atraumatic. Neck is supple.  Mallampati 2, normal nasal airflow.  neck circumference:15 inches . Dental status: biological teeth  Cardiovascular:  Regular rate and cardiac rhythm by pulse,  without distended neck veins. Respiratory: Lungs are clear to auscultation.  Skin:  Without evidence of ankle edema, or rash. Trunk: The patient's posture is erect.   Neurologic exam : The patient is awake and alert, oriented to place and time.   Memory subjective described as intact.  Attention span & concentration ability appears normal.  Speech is fluent,  without  dysarthria, dysphonia or aphasia.  Mood and affect are appropriate.   Cranial nerves: no loss of smell or taste reported :  Peppermint identified by smell.  Lemon identified on third attempt. Grapefruit extract, not identified.   Vanilla extract- not identified, but this is the smell she feels is bothering her .  Coconut extract - not identified, this feels like a chemical too.   Lavender- she likes the smell, but she couldn't identify  it. 06/09/21 oil - identified. Second attempt .  Cloves smell identified as powder.    Pupils are equal and briskly reactive to light. Funduscopic exam deferred.  Extraocular movements in vertical and horizontal planes were intact and without nystagmus. No Diplopia. Visual fields by finger perimetry are intact. Hearing was intact to soft voice and finger rubbing.    Facial sensation intact to fine touch.  Facial motor strength is symmetric and tongue and uvula move midline.  Neck ROM : rotation, tilt and flexion extension were normal for age and shoulder shrug was symmetrical.    Motor exam:  Symmetric bulk, tone and ROM.   Normal tone without cog wheeling, symmetric grip strength .         After spending a total time of 40 minutes face to face and additional time for physical and neurologic examination, review of laboratory studies,  personal review of imaging studies, reports and results of other testing and review of referral information / records as far as provided in visit, I have established the following assessments:  1) olfactory - hallucinations, more based on sweet smells, parosmia, lingering when nobody else will smell anything.     My Plan is to proceed with:  1) MRI brain , olfactory pathway  associative cortex for smells.  2 rehabilitation, smell clinic.     I would like to thank Sharlene Dory, DO and Aquilla Hacker, Pa-c [address] [place],  [place] [address] for allowing me to meet with and to take care of this pleasant patient.   I plan to follow up either personally or through our NP within 3 month.   CC: I will share my notes with PCP and  ENT.  Electronically signed by: Melvyn Novas, MD 08/29/2021 2:23 PM  [facility] Board certified by The [employer] of Sleep Medicine and Diplomate of the [employer] of Sleep Medicine. Board certified In Neurology through the ABPN, Fellow of  the [employer] of Neurology. Medical Director of [facility].   Loss of smell, also known as ANOSMIA, is one of the most common symptoms of COVID-19.   The [facility] Professor of Otolaryngology Loetta Rough, MD, and Assistant Professor Monika Salk, MD, have investigated several treatments for persistent anosmia (loss of smell), with a special interest in viral-related anosmia.     As the number of total, confirmed COVID-19 cases increases so does the number of people suffering from disease-related anosmia, making anosmia a significant public health problem.     Rehabilitation of anosmia :   Smell daily one sample of the following categories; and look at a picture of the object - f. Example at a picture of a lemon while smelling a lemon oil.   A floral scent  - such as rose, lavender, or vanilla - all would qualify for these.  A spice scent - nutmeg, anise, coffee, cinnamon-  A citrus smell - lemon, lime, orange  A menthol or minty scent, or eucalyptus.   Goal is to re-associate scent ( and eventually taste ) to the image.  The success is gradual, and this form of rehabilitation may take 12 month to succeed.   Another complication is PAROSMIA - smelling something that is not present, often an unpleasant smell of burning rubber or spoiled , foul smells.   This can be reduced by using a carbamazepine at 100 mg po bid. This antiepileptic medication slows the nerve conduction and allows the reduction in abnormal smell sensation.    Melvyn Novas, MD

## 2021-08-29 NOTE — Addendum Note (Signed)
Addended by: Melvyn Novas on: 08/29/2021 03:00 PM   Modules accepted: Orders

## 2021-08-29 NOTE — Addendum Note (Signed)
Addended by: Melvyn Novas on: 08/29/2021 03:03 PM   Modules accepted: Orders

## 2021-08-29 NOTE — Patient Instructions (Addendum)
  Rehabilitation of anosmia :   Smell daily one sample of the following categories; and look at a picture of the object - f. Example at a picture of a lemon while smelling a lemon oil.   A floral scent  - such as rose, lavender, or vanilla - all would qualify for these.  A spice scent - nutmeg, anise, coffee, cinnamon-  A citrus smell - lemon, lime, orange  A menthol or minty scent, or eucalyptus.   Goal is to re-associate scent ( and eventually taste ) to the image.  The success is gradual, and this form of rehabilitation may take 12 month to succeed.   Another complication is PAROSMIA - smelling something that is not present, often an unpleasant smell of burning rubber or spoiled , foul smells.   This can be reduced by using a carbamazepine at 100 mg po bid. This antiepileptic medication slows the nerve conduction and allows the reduction in abnormal smell sensation.       Treatment of ANOSMIA and Ageusia: If the combination of smell and vision of the same object that you're smelling improves the recovery of smell, then just smell alone. So we call that bimodal, visual and olfactory stimulation. So the standard olfactory training is four scents: rose, lemon, cloves and eucalyptus.  And the subject sniffs twice a day, 10, 12, 20 seconds each time the four essential oils for anywhere to six, eight or 12 weeks.  And olfactory training for anosmia has been around for maybe 10, 15 years, studies suggest it works.  There's nothing absolutely definitive- but it can't hurt. It's thought to work by retraining the brain, the process of neuroplasticity, retraining the brain to smell these odors again. And we think that by smelling, you can retrain the brain. Now, one of the outstanding questions is, are you retraining the brain to smell rose, lemon, cloves and eucalyptus better, but forget about coffee, vanilla, coconut?  How generalizable is the improvement in smell? Rosanne Ashing, we asked the patients what  smells would you like to pick and train on? The answer was fascinating to [place]. I thought it might be coffee as a coffee lover. I thought it might be vanilla as a vanilla-bean ice cream lover. It was smoke. The number one odor, smell, if you will, that people wanted to train on and to get better was smoke for the reasons that we talked about before.  The sense of smell is first and foremost attached to safety. And these people felt that if they could smell smoke better, they would feel more safe.  Unfortunately, there isn't a smoke essential oil so we couldn't do that.  But it just reinforced to [place] how important it is for patients to pick what smells they want to train on.  The other unique aspect that should be brought up is that half of the group are looking at high-quality pictures of the item, one of four items they picked, and each one of them have high-quality photographs presented on their iPhone or iPad at the same time that they smell.

## 2021-08-30 LAB — COMPREHENSIVE METABOLIC PANEL
ALT: 63 IU/L — ABNORMAL HIGH (ref 0–32)
AST: 62 IU/L — ABNORMAL HIGH (ref 0–40)
Albumin/Globulin Ratio: 1.3 (ref 1.2–2.2)
Albumin: 4.3 g/dL (ref 3.8–4.9)
Alkaline Phosphatase: 101 IU/L (ref 44–121)
BUN/Creatinine Ratio: 13 (ref 9–23)
BUN: 9 mg/dL (ref 6–24)
Bilirubin Total: 0.9 mg/dL (ref 0.0–1.2)
CO2: 29 mmol/L (ref 20–29)
Calcium: 9.5 mg/dL (ref 8.7–10.2)
Chloride: 94 mmol/L — ABNORMAL LOW (ref 96–106)
Creatinine, Ser: 0.67 mg/dL (ref 0.57–1.00)
Globulin, Total: 3.4 g/dL (ref 1.5–4.5)
Glucose: 345 mg/dL — ABNORMAL HIGH (ref 70–99)
Potassium: 5 mmol/L (ref 3.5–5.2)
Sodium: 135 mmol/L (ref 134–144)
Total Protein: 7.7 g/dL (ref 6.0–8.5)
eGFR: 102 mL/min/{1.73_m2} (ref >59)

## 2021-08-30 NOTE — Progress Notes (Signed)
High non-fast glucose, elevated liver enzymes.  Kidney function is normal and we can use contrast for Brain MRI

## 2021-09-02 ENCOUNTER — Encounter: Payer: Self-pay | Admitting: Neurology

## 2021-09-03 ENCOUNTER — Telehealth: Payer: Self-pay | Admitting: Neurology

## 2021-09-03 NOTE — Telephone Encounter (Signed)
LVM for pt to call back to schedule  [employer] auth: [id number] (exp. 09/02/21 to 10/01/21)

## 2021-09-04 NOTE — Telephone Encounter (Signed)
spoke to the patient she states she wanted a Friday or weekend order sent to GI pt is aware

## 2021-09-26 ENCOUNTER — Ambulatory Visit
Admission: RE | Admit: 2021-09-26 | Discharge: 2021-09-26 | Disposition: A | Discharge status: Home / Self Care | Payer: BC Managed Care – PPO | Source: Ambulatory Visit | Attending: Neurology | Admitting: Neurology

## 2021-09-26 ENCOUNTER — Other Ambulatory Visit: Payer: Self-pay

## 2021-09-26 DIAGNOSIS — H6993 Unspecified Eustachian tube disorder, bilateral: Secondary | ICD-10-CM

## 2021-09-26 DIAGNOSIS — R442 Other hallucinations: Secondary | ICD-10-CM | POA: Diagnosis not present

## 2021-09-26 DIAGNOSIS — H6983 Other specified disorders of Eustachian tube, bilateral: Secondary | ICD-10-CM

## 2021-09-26 DIAGNOSIS — R431 Parosmia: Secondary | ICD-10-CM | POA: Diagnosis not present

## 2021-09-26 MED ORDER — GADOBENATE DIMEGLUMINE 529 MG/ML IV SOLN
19.0000 mL | Freq: Once | INTRAVENOUS | Status: AC | PRN
  Administered 2021-09-26: 19 mL via INTRAVENOUS

## 2021-10-04 ENCOUNTER — Ambulatory Visit (INDEPENDENT_AMBULATORY_CARE_PROVIDER_SITE_OTHER): Payer: BC Managed Care – PPO | Admitting: Family Medicine

## 2021-10-04 ENCOUNTER — Other Ambulatory Visit (HOSPITAL_BASED_OUTPATIENT_CLINIC_OR_DEPARTMENT_OTHER): Payer: Self-pay

## 2021-10-04 ENCOUNTER — Encounter: Payer: Self-pay | Admitting: Family Medicine

## 2021-10-04 VITALS — BP 112/70 | HR 71 | Temp 98.0°F | Ht 63.0 in | Wt 171.4 lb

## 2021-10-04 DIAGNOSIS — E1169 Type 2 diabetes mellitus with other specified complication: Secondary | ICD-10-CM

## 2021-10-04 DIAGNOSIS — R252 Cramp and spasm: Secondary | ICD-10-CM | POA: Diagnosis not present

## 2021-10-04 DIAGNOSIS — E039 Hypothyroidism, unspecified: Secondary | ICD-10-CM

## 2021-10-04 DIAGNOSIS — L659 Nonscarring hair loss, unspecified: Secondary | ICD-10-CM

## 2021-10-04 DIAGNOSIS — R431 Parosmia: Secondary | ICD-10-CM | POA: Diagnosis not present

## 2021-10-04 DIAGNOSIS — E669 Obesity, unspecified: Secondary | ICD-10-CM

## 2021-10-04 DIAGNOSIS — M5136 Other intervertebral disc degeneration, lumbar region: Secondary | ICD-10-CM

## 2021-10-04 DIAGNOSIS — I10 Essential (primary) hypertension: Secondary | ICD-10-CM

## 2021-10-04 MED ORDER — MELOXICAM 15 MG PO TABS: ORAL_TABLET | Freq: Every day | ORAL | 0 refills | Status: DC

## 2021-10-04 MED ORDER — LISINOPRIL-HYDROCHLOROTHIAZIDE 20-12.5 MG PO TABS: 1.0000 | ORAL_TABLET | Freq: Every day | ORAL | 2 refills | Status: DC

## 2021-10-04 MED ORDER — GLYBURIDE 5 MG PO TABS
5.0000 mg | ORAL_TABLET | Freq: Two times a day (BID) | ORAL | 0 refills | Status: DC
Start: 2021-10-04 — End: 2021-11-24

## 2021-10-04 MED ORDER — METFORMIN HCL ER 500 MG PO TB24: ORAL_TABLET | ORAL | 3 refills | Status: DC

## 2021-10-04 MED ORDER — AMITRIPTYLINE HCL 25 MG PO TABS: 25.0000 mg | ORAL_TABLET | Freq: Every day | ORAL | 2 refills | Status: DC

## 2021-10-04 MED ORDER — TIZANIDINE HCL 4 MG PO TABS
4.0000 mg | ORAL_TABLET | Freq: Four times a day (QID) | ORAL | 0 refills | Status: DC | PRN
  Filled 2021-10-04: qty 30, 8d supply, fill #0

## 2021-10-04 MED ORDER — LEVOTHYROXINE SODIUM 125 MCG PO TABS: 125.0000 ug | ORAL_TABLET | Freq: Every day | ORAL | 2 refills | Status: DC

## 2021-10-04 NOTE — Progress Notes (Signed)
Chief Complaint  Patient presents with   Follow-up    Hair loss Nail problem Feet cramps     Subjective: Patient is a 55 y.o. female here for cramps. Here w spouse.   Patient is here to follow-up on her diabetes.  She takes metformin extended release 2 tablets daily, diabetic 5 mg twice daily.  Due to excessive work demands, she has not been able to follow-up for her diabetes routinely.  Diet is fair.  She does not exercise routinely.  No chest pain or shortness of breath.  She does not check her sugars routinely.  She refuses vaccinations at this time.  Patient has been having thinning hair.  No caustic materials over her scalp/hair.  She does not pull extra tight.  She is compliant with her levothyroxine 125 mcg daily.  She denies any bleeding in her stool or urine.  She has associated thinning/brittle nails.  Diet as above.  She is not malnourished.  Patient is been having cramping for several years.  She stays hydrated.  Electrolytes have historically been normal.  She tried pickle juice and mustard without relief.  She has never tried a muscle relaxer.  She also continues to have random episodes of smelling a foul odor.  She had a MRI of her brain that was unremarkable.  No sinus drainage or fevers.  Past Medical History:  Diagnosis Date   Diabetes mellitus without complication (HCC)    Hypertension    NAFLD (nonalcoholic fatty liver disease)    Thyroid disease     Objective: BP 112/70    Pulse 71    Temp 98 F (36.7 C) (Oral)    Ht 5\' 3"  (1.6 m)    Wt 171 lb 6 oz (77.7 kg)    SpO2 97%    BMI 30.36 kg/m  General: Awake, appears stated age Heart: RRR, no LE edema Skin: Thinning over the frontal portion of the scalp without frank alopecia, erythema, or scaling skin; nails w pronounced ridges longitudinally w/o separation of nailbed, hyperpigmentation, hematoma Lungs: CTAB, no rales, wheezes or rhonchi. No accessory muscle use Psych: Age appropriate judgment and insight, normal  affect and mood  Assessment and Plan: Parosmia - Plan: AMB Referral to [facility]ordin[facility]  Diabetes mellitus type 2 in obese (HCC) - Plan: AMB Referral to [facility], Comprehensive metabolic panel, Lipid panel, Hemoglobin A1c, CBC, Zinc, Microalbumin / creatinine urine ratio, metFORMIN (GLUCOPHAGE-XR) 500 MG 24 hr tablet, Iron, TIBC and Ferritin Panel  Leg cramping - Plan: Magnesium, TSH  Hair thinning-labs as above, consider vitamin B10  Brittle nails-check zinc and iron levels  DDD (degenerative disc disease), lumbar - Plan: amitriptyline (ELAVIL) 25 MG tablet  Hypothyroidism, unspecified type - Plan: levothyroxine (EUTHYROX) 125 MCG tablet  Essential hypertension - Plan: lisinopril-hydrochlorothiazide (ZESTORETIC) 20-12.5 MG tablet  For her smelling issue, we will refer her to chronic care management, particularly hoping for the advice of the pharmacy team.  The goal would be to see if any of the medication she is taking can contribute to her diagnosis. For her diabetes, she will currently continue metformin XR 1000 mg daily and glyburide 5 mg twice daily.  Counseled on diet and exercise.  Check labs.  Looking at sugar levels since her last A1c in February, she will likely need an adjustment.  Would hope Ozempic would be a reasonable option or possibly March.  Cost would be a barrier. Check labs as above, add tizanidine 4 mg as needed.  Continue to  stay hydrated. And zinc to above labs.  Needs to increase protein intake. Looks like onychorrhexis.  Cont other chronic meds.  F/u pending above.  The patient voiced understanding and agreement to the plan.  I spent 40 min w the patient discussing the above plan, reviewing her chart on the same day of visit and reviewing imaging ordered by other teams.   [place], DO 10/04/21  4:28 PM

## 2021-10-04 NOTE — Patient Instructions (Signed)
Give us 2-3 business days to get the results of your labs back.   Keep the diet clean and stay active.  Stay hydrated.   Let us know if you need anything.  

## 2021-10-04 NOTE — Addendum Note (Signed)
Addended by: Radene Gunning on: 10/04/2021 04:36 PM   Modules accepted: Level of Service

## 2021-10-07 ENCOUNTER — Other Ambulatory Visit (HOSPITAL_BASED_OUTPATIENT_CLINIC_OR_DEPARTMENT_OTHER): Payer: Self-pay

## 2021-10-07 ENCOUNTER — Telehealth: Payer: Self-pay | Admitting: *Deleted

## 2021-10-07 ENCOUNTER — Other Ambulatory Visit: Payer: Self-pay

## 2021-10-07 ENCOUNTER — Other Ambulatory Visit (INDEPENDENT_AMBULATORY_CARE_PROVIDER_SITE_OTHER): Payer: BC Managed Care – PPO

## 2021-10-07 DIAGNOSIS — I10 Essential (primary) hypertension: Secondary | ICD-10-CM | POA: Diagnosis not present

## 2021-10-07 DIAGNOSIS — E1169 Type 2 diabetes mellitus with other specified complication: Secondary | ICD-10-CM | POA: Diagnosis not present

## 2021-10-07 DIAGNOSIS — E669 Obesity, unspecified: Secondary | ICD-10-CM | POA: Diagnosis not present

## 2021-10-07 NOTE — Chronic Care Management (AMB) (Signed)
°  Care Management   Note  10/07/2021 Name: Lindsey Owen MRN: [id number] DOB: Nov 20, 1965  Lindsey Owen is a 55 y.o. year old female who is a primary care patient of Sharlene Dory, DO. I reached out to [employer] by phone today in response to a referral sent by Lindsey Owen's primary care provider.   Lindsey Owen was given information about care management services today including:  Care management services include personalized support from designated clinical staff supervised by her physician, including individualized plan of care and coordination with other care providers 24/7 contact phone numbers for assistance for urgent and routine care needs. The patient may stop care management services at any time by phone call to the office staff.  Patient agreed to services and verbal consent obtained.   Follow up plan: Telephone appointment with care management team member scheduled for: 11/01/2021  Burman Nieves, CCMA Care Guide, Embedded Care Coordination [facility]   Care Management  Direct Dial: [phone]

## 2021-10-07 NOTE — Addendum Note (Signed)
Addended by: Rosita Kea on: 10/07/2021 03:39 PM   Modules accepted: Orders

## 2021-10-08 LAB — MICROALBUMIN / CREATININE URINE RATIO
Creatinine,U: 74.6 mg/dL
Microalb Creat Ratio: 1.7 mg/g (ref 0.0–30.0)
Microalb, Ur: 1.3 mg/dL (ref 0.0–1.9)

## 2021-10-09 LAB — LIPID PANEL
Cholesterol: 185 mg/dL (ref <200)
HDL: 55 mg/dL (ref >=50)
LDL Cholesterol (Calc): 108 mg/dL (calc) — ABNORMAL HIGH
Non-HDL Cholesterol (Calc): 130 mg/dL (calc) — ABNORMAL HIGH (ref <130)
Total CHOL/HDL Ratio: 3.4 (calc) (ref <5.0)
Triglycerides: 112 mg/dL (ref <150)

## 2021-10-09 LAB — HEMOGLOBIN A1C
Hgb A1c MFr Bld: 12.5 % of total Hgb — ABNORMAL HIGH (ref <5.7)
Mean Plasma Glucose: 312 mg/dL
eAG (mmol/L): 17.3 mmol/L

## 2021-10-09 LAB — CBC
HCT: 43.9 % (ref 35.0–45.0)
Hemoglobin: 14.6 g/dL (ref 11.7–15.5)
MCH: 29.4 pg (ref 27.0–33.0)
MCHC: 33.3 g/dL (ref 32.0–36.0)
MCV: 88.3 fL (ref 80.0–100.0)
MPV: 10.7 fL (ref 7.5–12.5)
Platelets: 220 10*3/uL (ref 140–400)
RBC: 4.97 10*6/uL (ref 3.80–5.10)
RDW: 12.5 % (ref 11.0–15.0)
WBC: 7.9 10*3/uL (ref 3.8–10.8)

## 2021-10-09 LAB — COMPREHENSIVE METABOLIC PANEL
AG Ratio: 1.1 (calc) (ref 1.0–2.5)
ALT: 60 U/L — ABNORMAL HIGH (ref 6–29)
AST: 51 U/L — ABNORMAL HIGH (ref 10–35)
Albumin: 4.1 g/dL (ref 3.6–5.1)
Alkaline phosphatase (APISO): 88 U/L (ref 37–153)
BUN: 15 mg/dL (ref 7–25)
CO2: 29 mmol/L (ref 20–32)
Calcium: 9.7 mg/dL (ref 8.6–10.4)
Chloride: 95 mmol/L — ABNORMAL LOW (ref 98–110)
Creat: 0.73 mg/dL (ref 0.50–1.03)
Globulin: 3.8 g/dL (calc) — ABNORMAL HIGH (ref 1.9–3.7)
Glucose, Bld: 241 mg/dL — ABNORMAL HIGH (ref 65–99)
Potassium: 4.9 mmol/L (ref 3.5–5.3)
Sodium: 135 mmol/L (ref 135–146)
Total Bilirubin: 0.9 mg/dL (ref 0.2–1.2)
Total Protein: 7.9 g/dL (ref 6.1–8.1)

## 2021-10-09 LAB — TSH: TSH: 2.57 mIU/L (ref 0.40–4.50)

## 2021-10-09 LAB — MICROALBUMIN / CREATININE URINE RATIO

## 2021-10-09 LAB — MAGNESIUM: Magnesium: 1.7 mg/dL (ref 1.5–2.5)

## 2021-10-09 LAB — IRON,TIBC AND FERRITIN PANEL
%SAT: 29 % (calc) (ref 16–45)
Ferritin: 85 ng/mL (ref 16–232)
Iron: 97 ug/dL (ref 45–160)
TIBC: 330 mcg/dL (calc) (ref 250–450)

## 2021-10-09 LAB — ZINC

## 2021-10-22 ENCOUNTER — Telehealth: Payer: Self-pay | Admitting: Pharmacist

## 2021-10-22 NOTE — Chronic Care Management (AMB) (Signed)
° ° °  Chronic Care Management Pharmacy Assistant   Name: Lindsey Owen  MRN: [id number] DOB: 10/24/1965  Lindsey Owen is an 56 y.o. year old female who presents for his initial CCM visit with the clinical pharmacist.  Recent office visits:  10/04/21-Nicholas Carren Rang, DO (PCP) General follow up visit. Plan: AMB Referral to [facility] Coordination. Labs ordered. Start Tizanidine 4 mg as needed. 06/07/21-Laura Alanson Puls, FNP. Seen for a rash. Start on Valtrex gm tid for 7days, Prednisone 10 mg daily for 5 days. 05/31/21-Nicholas Carren Rang, DO (PCP) Seen for cyst under left breast. Start on Doxycycline 100 mg for 7 days and Tramadol as needed. Follow up in 1 week. 05/10/21-Nicholas Carren Rang, DO (PCP) Seen for a taste issue. Ambulatory referral to ENT.   Recent consult visits:  08/29/21-(Neurology) Melvyn Novas, MD. New patient visit. Follow up in 3 months. 05/23/21-(Otolaryngology) Beverely Risen. Notes not available.  Hospital visits:  None in previous 6 months  Medications: Outpatient Encounter Medications as of 10/22/2021  Medication Sig Note   ALPRAZolam (XANAX) 1 MG tablet Take 1 tablet (1 mg total) by mouth at bedtime as needed for anxiety.    amitriptyline (ELAVIL) 25 MG tablet Take 1 tablet (25 mg total) by mouth at bedtime.    Calcium Carbonate-Vit D-Min (CALCIUM 1200 PO) Take by mouth.    ferrous sulfate 325 (65 FE) MG tablet Take 1 tablet by mouth daily. 11/23/2015: Received from: [facility] Received Sig: Take by mouth.   fluticasone (FLONASE) 50 MCG/ACT nasal spray Place 2 sprays into both nostrils daily.    glyBURIDE (DIABETA) 5 MG tablet Take 1 tablet (5 mg total) by mouth 2 (two) times daily with a meal.    levothyroxine (EUTHYROX) 125 MCG tablet Take 1 tablet (125 mcg total) by mouth daily before breakfast.    lisinopril-hydrochlorothiazide (ZESTORETIC) 20-12.5 MG tablet Take 1 tablet by mouth daily.    meloxicam (MOBIC) 15 MG tablet  TAKE 1 TABLET BY MOUTH ONCE DAILY    metFORMIN (GLUCOPHAGE-XR) 500 MG 24 hr tablet TAKE 2 TABLETS BY MOUTH ONCE DAILY WITH BREAKFAST    Omega-3 Fatty Acids (FISH OIL) 1000 MG CAPS Take by mouth.    pregabalin (LYRICA) 75 MG capsule TAKE 1 CAPSULE (75 MG TOTAL) BY MOUTH DAILY.    tiZANidine (ZANAFLEX) 4 MG tablet Take 1 tablet (4 mg total) by mouth every 6 (six) hours as needed for muscle spasms.    No facility-administered encounter medications on file as of 10/22/2021.   ALPRAZolam (XANAX) 1 MG tablet Last filled:09/17/21 1 DS Amitriptyline (ELAVIL) 25 MG tablet Last filled:10/04/21 90 DS Calcium Carbonate-Vit D-Min (CALCIUM 1200 PO) Last filled:None noted Ferrous sulfate 325 (65 FE) MG tablet Last filled:None noted Fluticasone (FLONASE) 50 MCG/ACT nasal spray Last filled:None noted GlyBURIDE (DIABETA) 5 MG tablet Last filled:10/04/21 30 DS Levothyroxine (EUTHYROX) 125 MCG tablet Last filled:10/04/21 90 DS Lisinopril-hydrochlorothiazide (ZESTORETIC) 20-12.5 MG tablet Last filled:10/04/21 90 DS Meloxicam (MOBIC) 15 MG tablet Last filled:10/04/21 30 DS MetFORMIN (GLUCOPHAGE-XR) 500 MG 24 hr tablet Last filled:10/04/21 30 DS Omega-3 Fatty Acids (FISH OIL) 1000 MG CAPS Last filled:None noted Pregabalin (LYRICA) 75 MG capsule Last filled:10/11/21 90 DS TiZANidine (ZANAFLEX) 4 MG tablet Last filled:10/04/21 8 DS   Care Gaps: PAP SMEAR-Modifier:Due soon on 12/14/2021  Star Rating Drugs: Lisinopril-hydrochlorothiazide (ZESTORETIC) 20-12.5 MG tablet Last filled:10/04/21 90 DS MetFORMIN (GLUCOPHAGE-XR) 500 MG 24 hr tablet Last filled:10/04/21 30 DS  Myriam Carolin Coy, RMA Health Concierge

## 2021-10-31 DIAGNOSIS — M5412 Radiculopathy, cervical region: Secondary | ICD-10-CM | POA: Diagnosis not present

## 2021-10-31 DIAGNOSIS — M4722 Other spondylosis with radiculopathy, cervical region: Secondary | ICD-10-CM | POA: Diagnosis not present

## 2021-10-31 DIAGNOSIS — M542 Cervicalgia: Secondary | ICD-10-CM | POA: Diagnosis not present

## 2021-11-01 ENCOUNTER — Telehealth: Payer: BC Managed Care – PPO

## 2021-11-19 ENCOUNTER — Ambulatory Visit: Payer: BC Managed Care – PPO | Admitting: Pharmacist

## 2021-11-19 MED ORDER — ROSUVASTATIN CALCIUM 5 MG PO TABS: 5.0000 mg | ORAL_TABLET | Freq: Every day | ORAL | 3 refills | Status: DC

## 2021-11-24 MED ORDER — GLYBURIDE 5 MG PO TABS: 5.0000 mg | ORAL_TABLET | Freq: Two times a day (BID) | ORAL | 1 refills | Status: DC

## 2021-11-24 NOTE — Addendum Note (Signed)
Addended by: Henrene Pastor B on: 11/24/2021 03:25 PM   Modules accepted: Orders

## 2021-11-24 NOTE — Patient Instructions (Addendum)
Diabetes: Uncontrolled; Initial A1c goal is < 8.0% but will lower to  < 7.0% if able to reach goal without significant hypoglycemia Lab Results  Component Value Date   HGBA1C 12.5 (H) 10/04/2021    Current treatment: Metformin 528m ER - take 2 tablets daily with breakfast Glyburide 552m- take 1 tablet twice a day with a meal  Interventions: Reviewed home blood glucose reviewed goals and A1c goal Fasting blood glucose goal (before meals) = 80 to 130 Blood glucose goal after a meal = less than 180  Recommend patient start checking blood glucose once a day prior to breakfast (since this is easiest time for her to remember to check blood glucose) ChScientist, forensicHas a high deductible plan with $7000 deductible (per [employer] representative only met $209 as of 11/19/2021). Sent in prescription for 90 days for glyburide Patient to hold metformin to see if parosmia improves.  Looking into other options for diabetes. Could try pioglitazone or FaWilder Gladef she qualifies for patient assistance program.  Discussed adherence and complications of uncontrolled type 2 DM.  Hypertension: Controlled per last office blood pressure; blood pressure goal 130/80 BP Readings from Last 3 Encounters:  10/04/21 112/70  08/29/21 (!) 138/92  06/07/21 (!) 154/98    Current treatment:  Lisinopril hydrochlorothiazide 20/12.75m84m take 1 tablet daily each morning  Interventions:  Discussed blood pressure goals Discussed importance of blood pressure control to prevent heart and kidney disease  Hyperlipidemia: Uncontrolled; LDL goal <100 Lab Results  Component Value Date   CHOL 185 10/04/2021   HDL 55 10/04/2021   LDLCALC 108 (H) 10/04/2021   TRIG 112 10/04/2021   CHOLHDL 3.4 10/04/2021   Current treatment: none  Interventions:  Started rosuvastatin 75mg38mtake 1 tablet daily Discussed benefits of statin therapy in prevention of cardiovascular disease.  Recheck lipids in 2 to 3  months  Hypothyroidism:  Controlled: Last TSH was at goal Current therapy:  Levothyroxine 1275mc104mily  Adherence for 2022 was low at 9%  but patient did fill for 90 day supply 10/04/2021. Recommended take levothyroxine with her other medications with breakfast if that's easiest to remember. Best time to take it is when you know you will remember EVERY DAY.  Can also take prior to bedtime since not close to evening meal.    Patient Goals/Self-Care Activities Over the next 90 days, patient will:  Hold metformin for next 2 weeks to see if abnormal smells improves.  Start rosuvastatin 75mg d34my  take other medications as prescribed,  focus on medication adherence by using weekly pill containers and integrating with morning routine activities,  check glucose daily, document, and provide at future appointments, and  collaborate with provider on medication access solutions  Follow Up Plan: Telephone follow up appointment with care management team member scheduled for:  2 to 3 weeks

## 2021-11-24 NOTE — Chronic Care Management (AMB) (Signed)
Care Management   Pharmacy Note  11/24/2021 Name: Lindsey Owen MRN: [id number] DOB: 1966-08-03  Summary:  Reviewed medication list for potential pharmaceutical cause of parosmia. Possibly metformin, COVID vaccination. Enalapril is known to cause parosmia so lisinopril might as well. I feel most likely cause is COVID vaccination and metformin though uncontrolled type 2 DM has also been associated with parosmia.   Type 2 DM no well controlled and not currently taking statin.  Low adherence to DM medications and levothyroxine in 2022.   Plan:  Hold metformin for next 2 weeks to see if parosmia improves. Check blood glucose daily each morning - Call office if blood glucose is >200.  Will consult with PCP about possibly starting either pioglitazone, Farxiga or GLP1 (would need to apply for patient assistance program for Farxiga or GLP1) Started rosuvastatin 21m daily. Recheck lipids and LFTS in 2 to 3 months.  Discussed adherence and importance of improved diabetes control in prevention complications.  Sent in updated prescription for glyburide for 90 days supply.  Subjective: Lindsey Dowenis a 56y.o. year old female who is a primary care patient of WShelda Pal DO. The Care Management team was consulted for assistance with care management and care coordination needs.    Engaged with patient by telephone for initial visit in response to provider referral for pharmacy case management and/or care coordination services.   The patient was given information about Care Management services today including:  Care Management services includes personalized support from designated clinical staff supervised by the patient's primary care provider, including individualized plan of care and coordination with other care providers. 24/7 contact phone numbers for assistance for urgent and routine care needs. The patient may stop case management services at any time by phone call to the office  staff.  Patient agreed to services and consent obtained.  Assessment:  Review of patient status, including review of consultants reports, laboratory and other test data, was performed as part of comprehensive evaluation and provision of chronic care management services.   SDOH (Social Determinants of Health) assessments and interventions performed:  SDOH Interventions    Flowsheet Row Most Recent Value  SDOH Interventions   Financial Strain Interventions Other (Comment)  [Patient has high deductible plan $7000 OOP]  Stress Interventions Other (Comment), Offered Community Wellness Resources  [Referral to sEducation officer, museumfor stress management]       Recent PCP office visits: 10/04/2021 - [facility] (Dr WNani Ravens Patient reports hair loss, nail problem and cramps in feet.   Recent Consult Visits:  10/31/2021 - Neurosurgery (Reinaldo Meeker NP) Seen for upper extremity numbness and pain.  Unable to see full notes from visit.  08/29/2021 - Neurology (Dr Dohmeier) Seen for parosmia. Ordered MRI of brain and CMP.  Smell test performed. Recommended scent therapy (smell sample of one scent daily with picture of object to re-assiate scent to image. Also mentioned carbamazepien 100g bid might be beneficial for parosmia.  05/23/2021 - [facility](Dr NSallee Provencal Seen for dysonmia following COVID infection.Ordered sinus CT. Prescribed Dermotic oil for ear itching.   Objective:  Lab Results  Component Value Date   CREATININE 0.73 10/04/2021   CREATININE 0.67 08/29/2021   CREATININE 0.71 12/14/2020    Lab Results  Component Value Date   HGBA1C 12.5 (H) 10/04/2021       Component Value Date/Time   CHOL 185 10/04/2021 1551   TRIG 112 10/04/2021 1551   HDL 55 10/04/2021 1551   CHOLHDL 3.4 10/04/2021 1551  VLDL 30.6 12/14/2020 1110   LDLCALC 108 (H) 10/04/2021 1551    Other: (TSH, CBC, Vit D, etc.)  Clinical ASCVD: No  The 10-year ASCVD risk score (Arnett DK, et al., 2019) is: 12%   Values  used to calculate the score:     Age: 56 years     Sex: Female     Is Non-Hispanic African American: No     Diabetic: Yes     Tobacco smoker: Yes     Systolic Blood Pressure: 983 mmHg     Is BP treated: Yes     HDL Cholesterol: 55 mg/dL     Total Cholesterol: 185 mg/dL      BP Readings from Last 3 Encounters:  10/04/21 112/70  08/29/21 (!) 138/92  06/07/21 (!) 154/98    Care Plan  No Known Allergies  Medications Reviewed Today     Reviewed by Cherre Robins, RPH-CPP (Pharmacist) on 11/19/21 at [place] List Status: <None>   Medication Order Taking? Sig Documenting Provider Last Dose Status Informant  ALPRAZolam (XANAX) 1 MG tablet [id number] No Take 1 tablet (1 mg total) by mouth at bedtime as needed for anxiety.  Patient not taking: Reported on 11/19/2021   Dohmeier, Asencion Partridge, MD Not Taking Active   amitriptyline (ELAVIL) 25 MG tablet [id number] Yes Take 1 tablet (25 mg total) by mouth at bedtime.  Patient taking differently: Take 25 mg by mouth at bedtime as needed.   Shelda Pal, DO Taking Active   Calcium Carbonate-Vit D-Min (CALCIUM 1200 PO) [id number] Yes Take 1 tablet by mouth daily. [provider] Taking Active   fluticasone (FLONASE) 50 MCG/ACT nasal spray [id number] Yes Place 2 sprays into both nostrils daily.  Patient taking differently: Place 2 sprays into both nostrils daily as needed.   Dohmeier, Asencion Partridge, MD Taking Active   glyBURIDE (DIABETA) 5 MG tablet [id number] Yes Take 1 tablet (5 mg total) by mouth 2 (two) times daily with a meal.  Patient taking differently: Take 5 mg by mouth daily with breakfast.   Shelda Pal, DO Taking Active   levothyroxine (EUTHYROX) 125 MCG tablet [id number] Yes Take 1 tablet (125 mcg total) by mouth daily before breakfast. Shelda Pal, DO Taking Active            Med Note Antony Contras, [place] Nov 19, 2021  9:30 AM) Patient states she is only take about 4 times per week   lisinopril-hydrochlorothiazide (ZESTORETIC) 20-12.5 MG tablet [id number] Yes Take 1 tablet by mouth daily. Shelda Pal, DO Taking Active   meloxicam (MOBIC) 15 MG tablet [id number] Yes TAKE 1 TABLET BY MOUTH ONCE DAILY Shelda Pal, DO Taking Active   metFORMIN (GLUCOPHAGE-XR) 500 MG 24 hr tablet [id number] No TAKE 2 TABLETS BY MOUTH ONCE DAILY WITH BREAKFAST  Patient not taking: Reported on 11/19/2021   Shelda Pal, DO Not Taking Active   Omega-3 Fatty Acids (FISH OIL) 1000 MG CAPS [id number] No Take by mouth.  Patient not taking: Reported on 11/19/2021   [provider] Not Taking Active   pregabalin (LYRICA) 75 MG capsule [id number] Yes TAKE 1 CAPSULE (75 MG TOTAL) BY MOUTH DAILY.  Patient taking differently: Take 75 mg by mouth at bedtime.   Shelda Pal, DO Taking Active   tiZANidine (ZANAFLEX) 4 MG tablet [id number] Yes Take 1 tablet (4 mg total) by mouth every 6 (six) hours as needed for muscle spasms. 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   NAFLD (nonalcoholic fatty liver disease)    DDD (degenerative disc disease), lumbar 02/27/2020   Chronic bilateral low back pain without sciatica 09/09/2019   Arthritis 06/01/2019   Diabetes mellitus type 2 in obese ([place]) 09/10/2018   Essential hypertension 03/19/2015   Hypothyroidism 03/19/2015    Conditions to be addressed/monitored: HTN, HLD, DMII, Hypothyroidism, and parosmia; poor medication adherence  Care Plan : Provide education, support and care coordination for medication therapy and chronic conditions  Updates made by Cherre Robins, RPH-CPP since 11/24/2021 12:00 AM     Problem: Chronic Conditions: type 2 DM, hypothyroidism; HDL; HTN; parosmia; non alcoholic fatty liver disease;   Note:    Current Barriers:  Unable to independently afford treatment regimen Unable to achieve control of hyperlipidemia, type 2 DM  Does not adhere to prescribed medication regimen Does not maintain contact with provider office Parosomia - potential side effect from medications  Pharmacist Clinical Goal(s):  Over the next 90 days, patient will achieve adherence to monitoring guidelines and medication adherence to achieve therapeutic efficacy achieve control of type 2 DM and hyperlipidemia as evidenced by A1c <8.0 and LDL <100 adhere to plan to optimize therapeutic regimen for hyperlipidemia as evidenced by report of adherence to recommended medication management changes adhere to prescribed medication regimen as evidenced by refill history Identify potential medication related causes of parosomia  through collaboration with PharmD and provider.   Interventions: 1:1 collaboration with Shelda Pal, DO regarding development and update of comprehensive plan of care as evidenced by provider attestation and co-signature Inter-disciplinary care team collaboration (see longitudinal plan of care) Comprehensive medication review performed; medication list updated in electronic medical record  Diabetes: Uncontrolled; Initial A1c goal is < 8.0% but will lower to 7.0% if able to reach goal without significant hypoglycemia Current treatment: Metformin 59m ER - take 2 tablets daily with breakfast Glyburide 5101m- take 1 tablet twice a day with a meal  Patient admits to not taking metformin due to the smell of the tablets seems to be associate with her paranomia She also states she rarely remembers to take her glyburide in the evening due to erratic work and meal schedule.  She also states she skips the morning dose too.  Refill history: metformin 37% fills for 2022 - last refilled #30 days on 10/04/2021; glyburide 26% fills for 2022 - last refilled for 30 days on 10/04/2021 Current glucose  readings: fasting glucose: not checking at home Denies hypoglycemic symptoms.  Current meal patterns: usually eats breakfast before work; them might eat a snack when at work. Evening meal is late - usually 6 to 7pm. Current exercise: none Interventions: Reviewed home blood glucose reviewed goals and A1c goal Fasting blood glucose goal (before meals) = 80 to 130 Blood glucose goal after a meal = less than 180  Recommend patient start checking blood glucose once a day prior to breakfast (since this is easiest time for her to remember to check blood glucose) Checked her BCAstronomerUnfortunately she has a high deductible plan with $7000 deductible (per [employer] representative only met $209 so far). Unless she qualifies for patient assistance program brand name medications will not be affordable.  Sent in prescription for 90 days for glyburide Patient to hold metformin to see if parosmia improves.  Looking into other options  for diabetes. Could try pioglitazone or Wilder Glade is she qualifies for patient assistance program.  Discussed adherence and complications of uncontrolled type 2 DM.  Hypertension: Controlled per last office blood pressure; blood pressure goal 130/80 Current treatment:  Lisinopril hydrochlorothiazide 20/12.17m - take 1 tablet daily each morning  Reviewed adherence - for 2022 filled about 87% of time. Last refill for lisinopril hydrochlorothiazide was 10/04/2021 for 90 days Current home readings: does not check blood pressure at home Denies hypotensive/hypertensive symptoms Interventions:  Discussed blood pressure goals Discussed importance of blood pressure control to prevent heart and kidney disease  Hyperlipidemia: Uncontrolled; LDL goal <100 Current treatment: none  Medications previously tried: None  Has slightly elevated LFTs related to NAFLD Interventions:  Started rosuvastatin 573m- take 1 tablet daily Discussed benefits of statin therapy in prevention of  cardio vascular disease.  Recheck lipids in 2 to 3 months  Hypothyroidism:  Controlled: Last TSH was at goal Current therapy:  Levothyroxine 12559mdaily  Adherence for 2022 was low at 9%  but patient did fill for 90 day supply 10/04/2021 She admits that she forgets to take levothyroxine because she is supposed to take on an empty stomach.  Recommended she can take with her other medications with breakfast, its just that absorption is better on empty stomach, 30 minutes prior to eating. She could also take prior to bedtime since not close to evening meal.   Parosmia:  Has been present since after 3rd COVID booster States that she "gets a weird chemical smell in her nose" Has been improving but occurs about 1 or 2 times per day Reports that taking metformin sometimes triggers this smell Interventions:  Researched reasons for parosmia: smoking, radiation, chemotherapy, exposure to chemicals, dry mouth, head congestion, stroke, Multiple sclerosis, Parkinson's Disease, normal aging, hypothyroidism; type 2 DM Researches medications that might lead to parosmia: amlodipine, beta blockers, ciprofloxacin, diltiazem, doxycycline, enalapril, felodipine, methotrexate, lovastatin, terbinafine, zinc Metformin can have a fish like smell and could trigger symptoms.  Looking into affordable alternatives Patient to start checking blood glucose daily. Patient has stopped zinc supplement. Zinc level has been ordered but was cancelled due to not in correct lab tube to complete testing. Consider checking with next labs.   Patient Goals/Self-Care Activities Over the next 90 days, patient will:  take medications as prescribed,  focus on medication adherence by using weekly pill containers and integrating with morning routine activities,  check glucose daily, document, and provide at future appointments, and  collaborate with provider on medication access solutions  Follow Up Plan: Telephone follow up  appointment with care management team member scheduled for:  2 to 3 weeks         Medication Assistance:   Patient states cost of current medications is affordable but unable to afford any branded medications due to high deductible medical plan  Follow Up:  Patient agrees to Care Plan and Follow-up.  Plan: Telephone follow up appointment with care management team member scheduled for:  2 to 3 weeks  TamCherre RobinsharmD Clinical Pharmacist [facility]

## 2021-12-02 ENCOUNTER — Ambulatory Visit: Payer: BC Managed Care – PPO | Admitting: Pharmacist

## 2021-12-02 DIAGNOSIS — I10 Essential (primary) hypertension: Secondary | ICD-10-CM

## 2021-12-02 DIAGNOSIS — E669 Obesity, unspecified: Secondary | ICD-10-CM

## 2021-12-02 DIAGNOSIS — R431 Parosmia: Secondary | ICD-10-CM

## 2021-12-02 DIAGNOSIS — E1169 Type 2 diabetes mellitus with other specified complication: Secondary | ICD-10-CM

## 2021-12-02 MED ORDER — METFORMIN HCL ER 500 MG PO TB24: ORAL_TABLET | ORAL | 1 refills | Status: DC

## 2021-12-05 ENCOUNTER — Telehealth: Payer: Self-pay | Admitting: Pharmacist

## 2021-12-05 ENCOUNTER — Ambulatory Visit: Payer: BC Managed Care – PPO | Admitting: Neurology

## 2021-12-05 MED ORDER — RYBELSUS 3 MG PO TABS: 3.0000 mg | ORAL_TABLET | Freq: Every day | ORAL | 0 refills | Status: DC

## 2021-12-05 NOTE — Telephone Encounter (Signed)
Rybelsus Rx sent to [facility] - needed prior authorization. Called [employer] but their fax system was down and unable to fax. Also rep for [employer] said they are unable to do prior authorization over the phone. Requested [employer] to send to Cover My Meds. Waiting to be sent.

## 2021-12-05 NOTE — Chronic Care Management (AMB) (Signed)
Care Management   Pharmacy Note  12/05/2021 Name: Lindsey Owen MRN: [id number] DOB: 12/15/1965  Summary:  Type 2 DM improved. Patient restarted metformin. Adherence to medications has improved.  Statin started at last visit. Patient is tolerating.  No change in parasomia  Plan:  Increase metformin to 500mg  3 times a day Continue to check blood glucose daily each morning - Call office if blood glucose is >200.  Follow up in 4 weeks.  Subjective: Lindsey Owen is a 56 y.o. year old female who is a primary care patient of Shelda Pal, DO. The Care Management team was consulted for assistance with care management and care coordination needs.    Engaged with patient by telephone for follow up visit in response to provider referral for pharmacy case management and/or care coordination services.   The patient was given information about Care Management services today including:  Care Management services includes personalized support from designated clinical staff supervised by the patient's primary care provider, including individualized plan of care and coordination with other care providers. 24/7 contact phone numbers for assistance for urgent and routine care needs. The patient may stop case management services at any time by phone call to the office staff.  Patient agreed to services and consent obtained.  Assessment:  Review of patient status, including review of consultants reports, laboratory and other test data, was performed as part of comprehensive evaluation and provision of chronic care management services.   SDOH (Social Determinants of Health) assessments and interventions performed:     Recent PCP office visits: 10/04/2021 - [facility] (Dr Nani Ravens) Patient reports hair loss, nail problem and cramps in feet.   Recent Consult Visits:  10/31/2021 - Neurosurgery Reinaldo Meeker, NP) Seen for upper extremity numbness and pain.  Unable to see full notes from visit.   08/29/2021 - Neurology (Dr Dohmeier) Seen for parosmia. Ordered MRI of brain and CMP.  Smell test performed. Recommended scent therapy (smell sample of one scent daily with picture of object to re-assiate scent to image. Also mentioned carbamazepien 100g bid might be beneficial for parosmia.  05/23/2021 - ENT at [place] (Dr Sallee Provencal) Seen for dysonmia following COVID infection.Ordered sinus CT. Prescribed Dermotic oil for ear itching.   Objective:  Lab Results  Component Value Date   CREATININE 0.73 10/04/2021   CREATININE 0.67 08/29/2021   CREATININE 0.71 12/14/2020    Lab Results  Component Value Date   HGBA1C 12.5 (H) 10/04/2021       Component Value Date/Time   CHOL 185 10/04/2021 1551   TRIG 112 10/04/2021 1551   HDL 55 10/04/2021 1551   CHOLHDL 3.4 10/04/2021 1551   VLDL 30.6 12/14/2020 1110   LDLCALC 108 (H) 10/04/2021 1551    Clinical ASCVD: No  The 10-year ASCVD risk score (Arnett DK, et al., 2019) is: 12%   Values used to calculate the score:     Age: 78 years     Sex: Female     Is Non-Hispanic African American: No     Diabetic: Yes     Tobacco smoker: Yes     Systolic Blood Pressure: [id number]     Is BP treated: Yes     HDL Cholesterol: 55 mg/dL     Total Cholesterol: 185 mg/dL      BP Readings from Last 3 Encounters:  10/04/21 112/70  08/29/21 (!) 138/92  06/07/21 (!) 154/98    Care Plan  No Known Allergies  Medications Reviewed Today  Reviewed by Henrene Pastor, RPH-CPP (Pharmacist) on 12/02/21 at 1020  Med List Status: <None>   Medication Order Taking? Sig Documenting Provider Last Dose Status Informant  ALPRAZolam (XANAX) 1 MG tablet [id number] No Take 1 tablet (1 mg total) by mouth at bedtime as needed for anxiety.  Patient not taking: Reported on 12/02/2021   Dohmeier, Porfirio Mylar, MD Not Taking Active   amitriptyline (ELAVIL) 25 MG tablet [id number] Yes Take 1 tablet (25 mg total) by mouth at bedtime.  Patient taking differently: Take 25  mg by mouth at bedtime as needed.   Sharlene Dory, DO Taking Active   Calcium Carbonate-Vit D-Min (CALCIUM 1200 PO) [id number] Yes Take 1 tablet by mouth daily. [provider] Taking Active   fluticasone (FLONASE) 50 MCG/ACT nasal spray [id number] Yes Place 2 sprays into both nostrils daily.  Patient taking differently: Place 2 sprays into both nostrils daily as needed.   Dohmeier, Porfirio Mylar, MD Taking Active   glyBURIDE (DIABETA) 5 MG tablet [id number] Yes Take 1 tablet (5 mg total) by mouth 2 (two) times daily with a meal. Sharlene Dory, DO Taking Active   levothyroxine (EUTHYROX) 125 MCG tablet [id number] Yes Take 1 tablet (125 mcg total) by mouth daily before breakfast. Sharlene Dory, DO Taking Active            Med Note Clydie Braun, [place] Mikami B   Tue Nov 19, 2021  9:30 AM) Patient states she is only take about 4 times per week  lisinopril-hydrochlorothiazide (ZESTORETIC) 20-12.5 MG tablet [id number] Yes Take 1 tablet by mouth daily. Sharlene Dory, DO Taking Active   meloxicam (MOBIC) 15 MG tablet [id number] Yes TAKE 1 TABLET BY MOUTH ONCE DAILY Sharlene Dory, DO Taking Active   metFORMIN (GLUCOPHAGE-XR) 500 MG 24 hr tablet [id number] Yes TAKE 2 TABLETS BY MOUTH ONCE DAILY WITH BREAKFAST Sharlene Dory, DO Taking Active   Omega-3 Fatty Acids (FISH OIL) 1000 MG CAPS [id number] Yes Take by mouth. [provider] Taking Active   pregabalin (LYRICA) 75 MG capsule [id number] Yes TAKE 1 CAPSULE (75 MG TOTAL) BY MOUTH DAILY.  Patient taking differently: Take 75 mg by mouth at bedtime.   Sharlene Dory, DO Taking Active   rosuvastatin (CRESTOR) 5 MG tablet [id number] Yes Take 1 tablet (5 mg total) by mouth daily. Sharlene Dory, DO Taking Active   tiZANidine (ZANAFLEX) 4 MG tablet [id number] Yes Take 1 tablet (4 mg total) by mouth every 6 (six) hours as needed for muscle spasms. 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   NAFLD (nonalcoholic fatty liver disease)    DDD (degenerative disc disease), lumbar 02/27/2020   Chronic bilateral low back pain without sciatica 09/09/2019   Arthritis 06/01/2019   Diabetes mellitus type 2 in obese (HCC) 09/10/2018   Essential hypertension 03/19/2015   Hypothyroidism 03/19/2015    Conditions to be addressed/monitored: HTN, HLD, DMII, Hypothyroidism, and parosmia; poor medication adherence  Care Plan : Pharmacy Care Plan  Updates made by Henrene Pastor, RPH-CPP since 12/05/2021 12:00 AM     Problem: Chronic Conditions: type 2 DM, hypothyroidism; HDL; HTN; parosmia; non alcoholic fatty liver disease;   Note:   Current Barriers:  Unable to independently afford treatment regimen Unable to achieve control of  hyperlipidemia, type 2 DM  Does not adhere to prescribed medication regimen Does not maintain contact with provider office Parosomia - potential side effect from medications  Pharmacist Clinical Goal(s):  Over the next 90 days, patient will achieve adherence to monitoring guidelines and medication adherence to achieve therapeutic efficacy achieve control of type 2 DM and hyperlipidemia as evidenced by A1c <8.0 and LDL <100 adhere to plan to optimize therapeutic regimen for hyperlipidemia as evidenced by report of adherence to recommended medication management changes adhere to prescribed medication regimen as evidenced by refill history Identify potential medication related causes of parosomia  through collaboration with PharmD and provider.   Interventions: 1:1 collaboration with Shelda Pal, DO regarding development and update of comprehensive plan of care as evidenced by provider attestation and co-signature Inter-disciplinary care team collaboration (see longitudinal  plan of care) Comprehensive medication review performed; medication list updated in electronic medical record  Diabetes: Uncontrolled; Initial A1c goal is < 8.0% but will lower to 7.0% if able to reach goal without significant hypoglycemia Current treatment: Metformin 500mg  ER - take 2 tablets daily with breakfast Glyburide 5mg  - take 1 tablet twice a day with a meal  At previous visits, patient admitted to not taking metformin due to the smell of the tablets seems to be associate with her paranomia. Today, 12/02/2021 she reports that she has been compliant with taking metformin as prescribed. She is tolerating the smell / taste of the tablets better.  She also reports better adherence to glyburide over the last 2 weeks.  Refill history: metformin 37% fills for 2022 - refilled #30 days on 10/04/2021, 11/01/2021 and 12/01/2021; glyburide 26% fills for 2022 - last refilled for 90 days on 11/24/2021 Current glucose readings: fasting glucose: 200 to 260 (high but improved) Denies hypoglycemic symptoms.  Current meal patterns: usually eats breakfast before work; them might eat a snack midday when at work. Evening meal  - usually 6 to 7pm. Current exercise: none Interventions: Reviewed home blood glucose reviewed goals and A1c goal Fasting blood glucose goal (before meals) = 80 to 130 Blood glucose goal after a meal = less than 180  Recommend patient start checking blood glucose once a day prior to breakfast (since this is easiest time for her to remember to check blood glucose) Checked her Astronomer. Unfortunately she has a high deductible plan with $7000 deductible. Unless she qualifies for patient assistance program brand name medications will not be affordable.  I discussed GLP1 and SGLT2 agents. She declines injections / even once weekly. She might be a good candidate for Rybelsus. Will send her application for patient assistance program.  Checked [employer] for possible copay  relief funding. None currently open for diabetes.  Increase metformin to 500mg  3 times a day Reviewed adherence and complications of uncontrolled type 2 DM.  Hypertension: Controlled per last office blood pressure; blood pressure goal 130/80 Current treatment:  Lisinopril hydrochlorothiazide 20/12.5mg  - take 1 tablet daily each morning  Reviewed adherence - for 2022 filled about 87% of time. Last refill for lisinopril hydrochlorothiazide was 10/04/2021 for 90 days Current home readings: does not check blood pressure at home Denies hypotensive/hypertensive symptoms Interventions:  Discussed blood pressure goals Discussed importance of blood pressure control to prevent heart and kidney disease  Hyperlipidemia: Uncontrolled; LDL goal <100 Current treatment:  Rosuvastatin 5mg  daily (started 11/01/2021) Medications previously tried: None  Has slightly elevated LFTs related to NAFLD Interventions:  Continue rosuvastatin 5mg  - take 1 tablet daily Reviewed  benefits of statin therapy in prevention of cardio vascular disease.  Recheck lipids in 2 to 3 months  Hypothyroidism:  Controlled: Last TSH was at goal Current therapy:  Levothyroxine 156mcg daily  Adherence for 2022 was low at 9%  but patient did fill for 90 day supply 10/04/2021 She admits that she forgets to take levothyroxine because she is supposed to take on an empty stomach.  Interventions: (addressed at previous visit) Recommended she can take with her other medications with breakfast, its just that absorption is better on empty stomach, 30 minutes prior to eating. She could also take prior to bedtime since not close to evening meal.   Parosmia:  Has been present since after 3rd COVID booster States that she "gets a weird chemical smell in her nose" Continues to improve but occurs about 1 or 2 times per day Reports that taking metformin sometimes triggers this smell Interventions: (addressed at previous visit) Researched  reasons for parosmia: smoking, radiation, chemotherapy, exposure to chemicals, dry mouth, head congestion, stroke, Multiple sclerosis, Parkinson's Disease, normal aging, hypothyroidism; type 2 DM Researches medications that might lead to parosmia: amlodipine, beta blockers, ciprofloxacin, diltiazem, doxycycline, enalapril, felodipine, methotrexate, lovastatin, terbinafine, zinc Metformin can have a fish like smell and could trigger symptoms.  Looking into affordable alternatives Patient to start checking blood glucose daily. Patient has stopped zinc supplement. Zinc level has been ordered but was cancelled due to not in correct lab tube to complete testing. Consider checking with next labs.   Patient Goals/Self-Care Activities Over the next 90 days, patient will:  Increase metformin to 500mg  3 times a day  Take other medications as prescribed,  focus on medication adherence by using weekly pill containers and integrating with morning routine activities,  check glucose daily, document, and provide at future appointments, and  collaborate with provider on medication access solutions - I am sending application for Ryblesus (possible diabetes treatment) patient assistance program. Please complete and return to our office.   Follow Up Plan: Telephone follow up appointment with care management team member scheduled for:  2 to 3 weeks         Medication Assistance:   Patient states cost of current medications is affordable but unable to afford any branded medications due to high deductible medical plan  Mailed patient application for [employer] for Rybelsus - will start is approved.   Follow Up:  Patient agrees to Care Plan and Follow-up.  Plan: Telephone follow up appointment with care management team member scheduled for:  4  weeks  Cherre Robins, PharmD Clinical Pharmacist [facility] [place] [facility]

## 2021-12-05 NOTE — Patient Instructions (Signed)
Lindsey Owen It was a pleasure speaking with you today.  I have attached a summary of our visit today and information about your health goals.   Patient Goals/Self-Care Activities Over the next 90 days, patient will:  Increase metformin to 500mg  3 times a day  Take other medications as prescribed,  focus on medication adherence by using weekly pill containers and integrating with morning routine activities,  check glucose daily, document, and provide at future appointments, and  collaborate with provider on medication access solutions - I am sending application for Ryblesus (possible diabetes treatment) patient assistance program. Please complete and return to our office.    If you have any questions or concerns, please feel free to contact me either at the phone number below or with a MyChart message.   Keep up the good work!  Cherre Robins, PharmD Clinical Pharmacist [facility] Primary [facility] [phone] (direct line)  [phone] (main office number)  Diabetes: Uncontrolled; Initial A1c goal is < 8.0% but will lower to  < 7.0% if able to reach goal without significant hypoglycemia  Lab Results  Component Value Date   HGBA1C 12.5 (H) 10/04/2021    Current treatment: Metformin 500mg  ER - take 2 tablets daily with breakfast Glyburide 5mg  - take 1 tablet twice a day with a meal  Interventions: Reviewed home blood glucose reviewed goals and A1c goal Fasting blood glucose goal (before meals) = 80 to 130 Blood glucose goal after a meal = less than 180  Continue to check blood glucose once a day prior to breakfast (since this is easiest time for her to remember to check blood glucose) Scientist, forensic. Has a high deductible plan with $7000 deductible. Checking into options for financial assistance.  Increase metformin to 500mg  3 times a day I am mailing you an application for Rybelsus which is a medication for diabetes. Complete the application and  return to Cherre Robins at [facility].  Discussed adherence and complications of uncontrolled type 2 DM.  Hypertension: Controlled per last office blood pressure; blood pressure goal 130/80 BP Readings from Last 3 Encounters:  10/04/21 112/70  08/29/21 (!) 138/92  06/07/21 (!) 154/98    Current treatment:  Lisinopril hydrochlorothiazide 20/12.5mg  - take 1 tablet daily each morning  Interventions:  Discussed blood pressure goals Discussed importance of blood pressure control to prevent heart and kidney disease  Hyperlipidemia: Uncontrolled; LDL goal <100 Lab Results  Component Value Date   CHOL 185 10/04/2021   HDL 55 10/04/2021   LDLCALC 108 (H) 10/04/2021   TRIG 112 10/04/2021   CHOLHDL 3.4 10/04/2021   Current treatment:  rosuvastatin 5mg  - take 1 tablet daily Interventions:  Continue rosuvastatin 5mg  - take 1 tablet daily Discussed benefits of statin therapy in prevention of cardiovascular disease.  Recheck lipids in 2 to 3 months  Hypothyroidism:  Controlled: Last TSH was at goal Current therapy:  Levothyroxine 155mcg daily  Interventions: Recommended take levothyroxine with her other medications with breakfast if that's easiest to remember. Best time to take it is when you know you will remember EVERY DAY.  Can also take prior to bedtime since not close to evening meal.   Patient Goals/Self-Care Activities Over the next 90 days, patient will:  Increase metformin to 500mg  3 times a day  Take other medications as prescribed,  focus on medication adherence by using weekly pill containers and integrating with morning routine activities,  check glucose daily, document, and provide at future appointments,  and  collaborate with provider on medication access solutions - I am sending application for Ryblesus (possible diabetes treatment) patient assistance program. Please complete and return to our office.   Follow Up Plan: Telephone follow up appointment with  care management team member scheduled for:  4 weeks    Patient verbalizes understanding of instructions and care plan provided today and agrees to view in [place]. Active MyChart status confirmed with patient.

## 2021-12-09 NOTE — Telephone Encounter (Signed)
Message from Plan  Rybelbsus prior authorization Effective from 12/09/2021 through 12/08/2022. Called [facility] to check copay but prior authorization is not going through just yet. Will check back tomorrow. I worry that with her high deductible plan, Rybelsus might still be too expensive.

## 2021-12-10 ENCOUNTER — Telehealth: Payer: Self-pay

## 2021-12-10 NOTE — Telephone Encounter (Signed)
PA initiated via Covermymeds; KEY: [id number]. Awaiting determination.

## 2021-12-10 NOTE — Telephone Encounter (Signed)
PA approved.   Effective from 12/10/2021 through 12/09/2022

## 2021-12-11 NOTE — Telephone Encounter (Signed)
Called [employer]. They were able to run Rybelsus through and prior authorization was approved but cos tof Rybelsus was still >$900 for 30 days due to high deductible plan.  Patient has been mailed medication assistance program application for [employer] / Rybelsus.

## 2021-12-12 ENCOUNTER — Other Ambulatory Visit: Payer: Self-pay | Admitting: Family Medicine

## 2021-12-12 ENCOUNTER — Other Ambulatory Visit (HOSPITAL_BASED_OUTPATIENT_CLINIC_OR_DEPARTMENT_OTHER): Payer: Self-pay

## 2021-12-12 MED ORDER — TIZANIDINE HCL 4 MG PO TABS
4.0000 mg | ORAL_TABLET | Freq: Four times a day (QID) | ORAL | 0 refills | Status: DC | PRN
  Filled 2021-12-12: qty 30, 8d supply, fill #0

## 2021-12-31 ENCOUNTER — Telehealth: Payer: BC Managed Care – PPO

## 2022-01-31 ENCOUNTER — Encounter: Payer: Self-pay | Admitting: Family Medicine

## 2022-01-31 ENCOUNTER — Ambulatory Visit (INDEPENDENT_AMBULATORY_CARE_PROVIDER_SITE_OTHER): Payer: BC Managed Care – PPO | Admitting: Family Medicine

## 2022-01-31 ENCOUNTER — Other Ambulatory Visit (HOSPITAL_BASED_OUTPATIENT_CLINIC_OR_DEPARTMENT_OTHER): Payer: Self-pay

## 2022-01-31 VITALS — BP 120/76 | HR 82 | Temp 98.2°F | Ht 63.0 in | Wt 172.2 lb

## 2022-01-31 DIAGNOSIS — E669 Obesity, unspecified: Secondary | ICD-10-CM

## 2022-01-31 DIAGNOSIS — E1169 Type 2 diabetes mellitus with other specified complication: Secondary | ICD-10-CM

## 2022-01-31 DIAGNOSIS — L299 Pruritus, unspecified: Secondary | ICD-10-CM

## 2022-01-31 DIAGNOSIS — R252 Cramp and spasm: Secondary | ICD-10-CM | POA: Diagnosis not present

## 2022-01-31 MED ORDER — CLOBETASOL PROPIONATE 0.05 % EX SOLN
1.0000 "application " | Freq: Two times a day (BID) | CUTANEOUS | 2 refills | Status: DC
  Filled 2022-01-31: qty 50, 25d supply, fill #0

## 2022-01-31 MED ORDER — PREGABALIN 75 MG PO CAPS
75.0000 mg | ORAL_CAPSULE | Freq: Every evening | ORAL | 1 refills | Status: DC
  Filled 2022-01-31: qty 90, 90d supply, fill #0

## 2022-01-31 NOTE — Addendum Note (Signed)
Addended by: Mervin Kung A on: 01/31/2022 02:07 PM ? ? Modules accepted: Orders ? ?

## 2022-01-31 NOTE — Patient Instructions (Addendum)
Give [place] 2-3 business days to get the results of your labs back.  ? ?Keep the diet clean and stay active. ? ?Send me a message if the scalp is still bothersome.  ? ?Let us know if you need anything. ?

## 2022-01-31 NOTE — Progress Notes (Signed)
Subjective:  ? ?Chief Complaint  ?Patient presents with  ? Follow-up  ?  Check scalp ?nails ?  ? ? ?Lindsey Owen is a 56 y.o. female here for follow-up of diabetes.   ?[place] does not routinely check her sugars.  ?Patient does not require insulin.   ?Medications include: Metformin XR 500 mg TID, glyburide 5 mg bid ?Diet is healthy overall.  ?Exercise: active at work ?No CP or SOB. ? ?Fatigue ?Still having fatigue. Works a lot. Wants to make sure nothing is abnormal with her labs.  Still having leg cramping which Lyrica is not helping for though it does help with her neuropathy. ? ?Over the past few months, the patient has had itchy areas on her scalp that sometimes bleed and once they start to open up, start to hurt.  No new topicals.  She has not tried anything at home.  There is no drainage. ? ?Past Medical History:  ?Diagnosis Date  ? Diabetes mellitus without complication ([place])   ? Hypertension   ? NAFLD (nonalcoholic fatty liver disease)   ? Thyroid disease   ?  ? ?Related testing: ?Retinal exam: Done ?Pneumovax: done ? ?Objective:  ?BP 120/76   Pulse 82   Temp 98.2 ?F (36.8 ?C) (Oral)   Ht 5\' 3"  (1.6 m)   Wt 172 lb 4 oz (78.1 kg)   SpO2 97%   BMI 30.51 kg/m?  ?General:  Well developed, well nourished, in no apparent distress ?Skin: Macerated tissue between 4/5 digits on the left.  Warm, no pallor or diaphoresis.  On the scalp there are scattered areas of excoriation without surrounding erythema, purulence, or drainage. ?Head:  Normocephalic, atraumatic ?Eyes:  Pupils equal and round, sclera anicteric without injection  ?Lungs:  CTAB, no access msc use ?Cardio:  RRR, no bruits, no LE edema ?Musculoskeletal:  Symmetrical muscle groups noted without atrophy or deformity ?Neuro:  Sensation intact to pinprick on feet ?Psych: Age appropriate judgment and insight ? ?Assessment:  ? ?Diabetes mellitus type 2 in obese [facility]), Chronic - Plan: CBC, Comprehensive metabolic panel, Hemoglobin A1c, Lipid panel,  pregabalin (LYRICA) 75 MG capsule, CANCELED: Microalbumin / creatinine urine ratio ? ?Leg cramping - Plan: IBC + Ferritin, Magnesium, TSH, B12 ? ?Itchy scalp - Plan: clobetasol (TEMOVATE) 0.05 % external solution ? ?Diabetes mellitus type 2 in obese ([place]) - Plan: pregabalin (LYRICA) 75 MG capsule  ? ?Plan:  ? ?Chronic, not controlled.  Check A1c.  If still very elevated, will refer to endocrinology.  For now, continue metformin and glyburide 5 mg twice daily.  Counseled on diet and exercise. ?Check labs. ?Start clobetasol solution for her scalp to see if we can control itching.  If no improvement, will refer to dermatology. ?F/u pending above ?The patient voiced understanding and agreement to the plan. ? ?Shelda Pal, DO ?01/31/22 ?2:00 PM ? ?

## 2022-02-01 ENCOUNTER — Other Ambulatory Visit: Payer: Self-pay | Admitting: Family Medicine

## 2022-02-01 ENCOUNTER — Encounter: Payer: Self-pay | Admitting: Family Medicine

## 2022-02-01 DIAGNOSIS — E1169 Type 2 diabetes mellitus with other specified complication: Secondary | ICD-10-CM

## 2022-02-01 LAB — COMPREHENSIVE METABOLIC PANEL
AG Ratio: 1.2 (calc) (ref 1.0–2.5)
ALT: 49 U/L — ABNORMAL HIGH (ref 6–29)
AST: 48 U/L — ABNORMAL HIGH (ref 10–35)
Albumin: 3.8 g/dL (ref 3.6–5.1)
Alkaline phosphatase (APISO): 75 U/L (ref 37–153)
BUN: 12 mg/dL (ref 7–25)
CO2: 28 mmol/L (ref 20–32)
Calcium: 9.1 mg/dL (ref 8.6–10.4)
Chloride: 96 mmol/L — ABNORMAL LOW (ref 98–110)
Creat: 0.72 mg/dL (ref 0.50–1.03)
Globulin: 3.1 g/dL (calc) (ref 1.9–3.7)
Glucose, Bld: 395 mg/dL — ABNORMAL HIGH (ref 65–99)
Potassium: 4.4 mmol/L (ref 3.5–5.3)
Sodium: 134 mmol/L — ABNORMAL LOW (ref 135–146)
Total Bilirubin: 1 mg/dL (ref 0.2–1.2)
Total Protein: 6.9 g/dL (ref 6.1–8.1)

## 2022-02-01 LAB — CBC
HCT: 41.8 % (ref 35.0–45.0)
Hemoglobin: 13.7 g/dL (ref 11.7–15.5)
MCH: 29.5 pg (ref 27.0–33.0)
MCHC: 32.8 g/dL (ref 32.0–36.0)
MCV: 90.1 fL (ref 80.0–100.0)
MPV: 11.5 fL (ref 7.5–12.5)
Platelets: 144 10*3/uL (ref 140–400)
RBC: 4.64 10*6/uL (ref 3.80–5.10)
RDW: 12.7 % (ref 11.0–15.0)
WBC: 6.2 10*3/uL (ref 3.8–10.8)

## 2022-02-01 LAB — LIPID PANEL
Cholesterol: 149 mg/dL (ref <200)
HDL: 49 mg/dL — ABNORMAL LOW (ref >=50)
LDL Cholesterol (Calc): 67 mg/dL (calc)
Non-HDL Cholesterol (Calc): 100 mg/dL (calc) (ref <130)
Total CHOL/HDL Ratio: 3 (calc) (ref <5.0)
Triglycerides: 250 mg/dL — ABNORMAL HIGH (ref <150)

## 2022-02-01 LAB — IRON,TIBC AND FERRITIN PANEL
%SAT: 41 % (calc) (ref 16–45)
Ferritin: 70 ng/mL (ref 16–232)
Iron: 122 ug/dL (ref 45–160)
TIBC: 298 mcg/dL (calc) (ref 250–450)

## 2022-02-01 LAB — MAGNESIUM: Magnesium: 1.7 mg/dL (ref 1.5–2.5)

## 2022-02-01 LAB — VITAMIN B12: Vitamin B-12: 516 pg/mL (ref 200–1100)

## 2022-02-01 LAB — HEMOGLOBIN A1C
Hgb A1c MFr Bld: 12.4 % of total Hgb — ABNORMAL HIGH (ref <5.7)
Mean Plasma Glucose: 309 mg/dL
eAG (mmol/L): 17.1 mmol/L

## 2022-02-01 LAB — TSH: TSH: 2.16 mIU/L (ref 0.40–4.50)

## 2022-02-01 MED ORDER — PIOGLITAZONE HCL 30 MG PO TABS: 30.0000 mg | ORAL_TABLET | Freq: Every day | ORAL | 2 refills | Status: DC

## 2022-02-01 MED ORDER — METFORMIN HCL ER 500 MG PO TB24: 1000.0000 mg | ORAL_TABLET | Freq: Two times a day (BID) | ORAL | 1 refills | Status: DC

## 2022-02-03 ENCOUNTER — Other Ambulatory Visit: Payer: Self-pay | Admitting: Family Medicine

## 2022-02-03 DIAGNOSIS — E785 Hyperlipidemia, unspecified: Secondary | ICD-10-CM

## 2022-02-03 DIAGNOSIS — E669 Obesity, unspecified: Secondary | ICD-10-CM

## 2022-02-07 ENCOUNTER — Ambulatory Visit (HOSPITAL_BASED_OUTPATIENT_CLINIC_OR_DEPARTMENT_OTHER)
Admission: RE | Admit: 2022-02-07 | Discharge: 2022-02-07 | Disposition: A | Discharge status: Home / Self Care | Payer: BC Managed Care – PPO | Source: Ambulatory Visit | Attending: Family Medicine | Admitting: Family Medicine

## 2022-02-07 ENCOUNTER — Ambulatory Visit (INDEPENDENT_AMBULATORY_CARE_PROVIDER_SITE_OTHER): Payer: BC Managed Care – PPO | Admitting: Family Medicine

## 2022-02-07 ENCOUNTER — Encounter: Payer: Self-pay | Admitting: Family Medicine

## 2022-02-07 ENCOUNTER — Other Ambulatory Visit (HOSPITAL_BASED_OUTPATIENT_CLINIC_OR_DEPARTMENT_OTHER): Payer: Self-pay

## 2022-02-07 VITALS — BP 128/90 | HR 87 | Temp 97.3°F | Resp 18 | Ht 63.0 in | Wt 171.6 lb

## 2022-02-07 DIAGNOSIS — M545 Low back pain, unspecified: Secondary | ICD-10-CM

## 2022-02-07 DIAGNOSIS — M5136 Other intervertebral disc degeneration, lumbar region: Secondary | ICD-10-CM | POA: Diagnosis not present

## 2022-02-07 DIAGNOSIS — M542 Cervicalgia: Secondary | ICD-10-CM | POA: Diagnosis not present

## 2022-02-07 IMAGING — DX DG LUMBAR SPINE COMPLETE 4+V
5 series · 5 of 5 positions shown · non-contrast
Comparison: X-ray [DATE].

CLINICAL DATA: Low back pain radiating into right leg.

EXAM:
LUMBAR SPINE - COMPLETE 4+ VIEW

[l-spine ap]
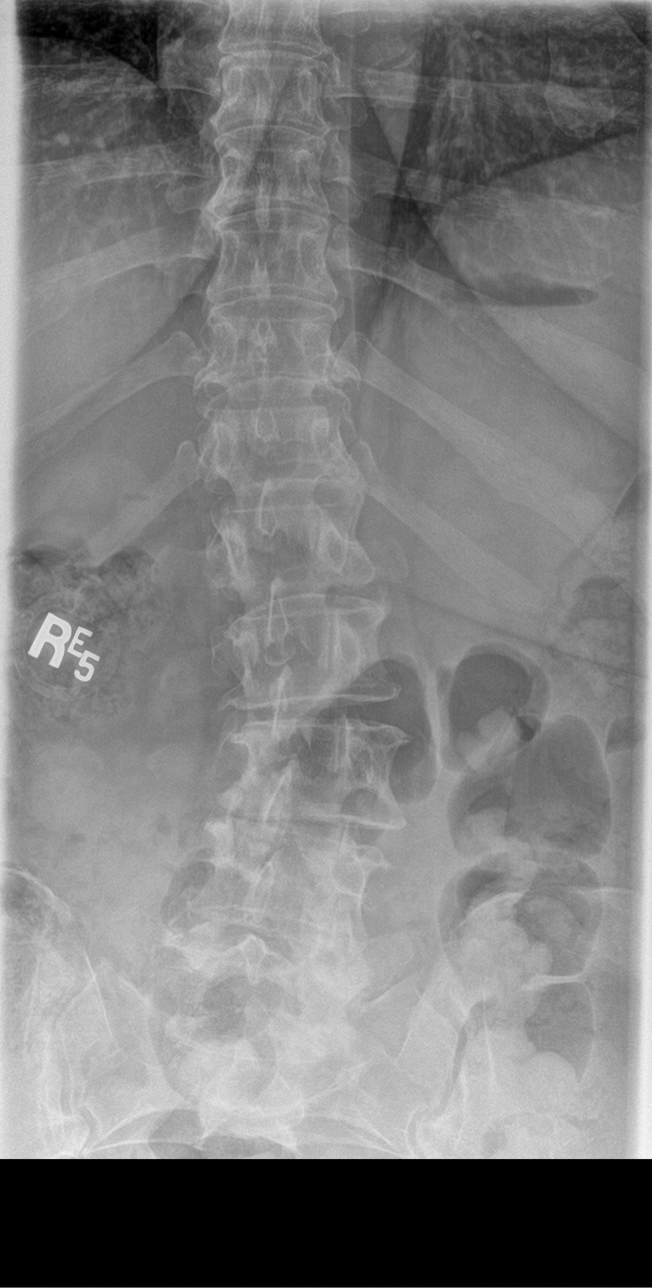

[l-spine obl (1 of 2)]
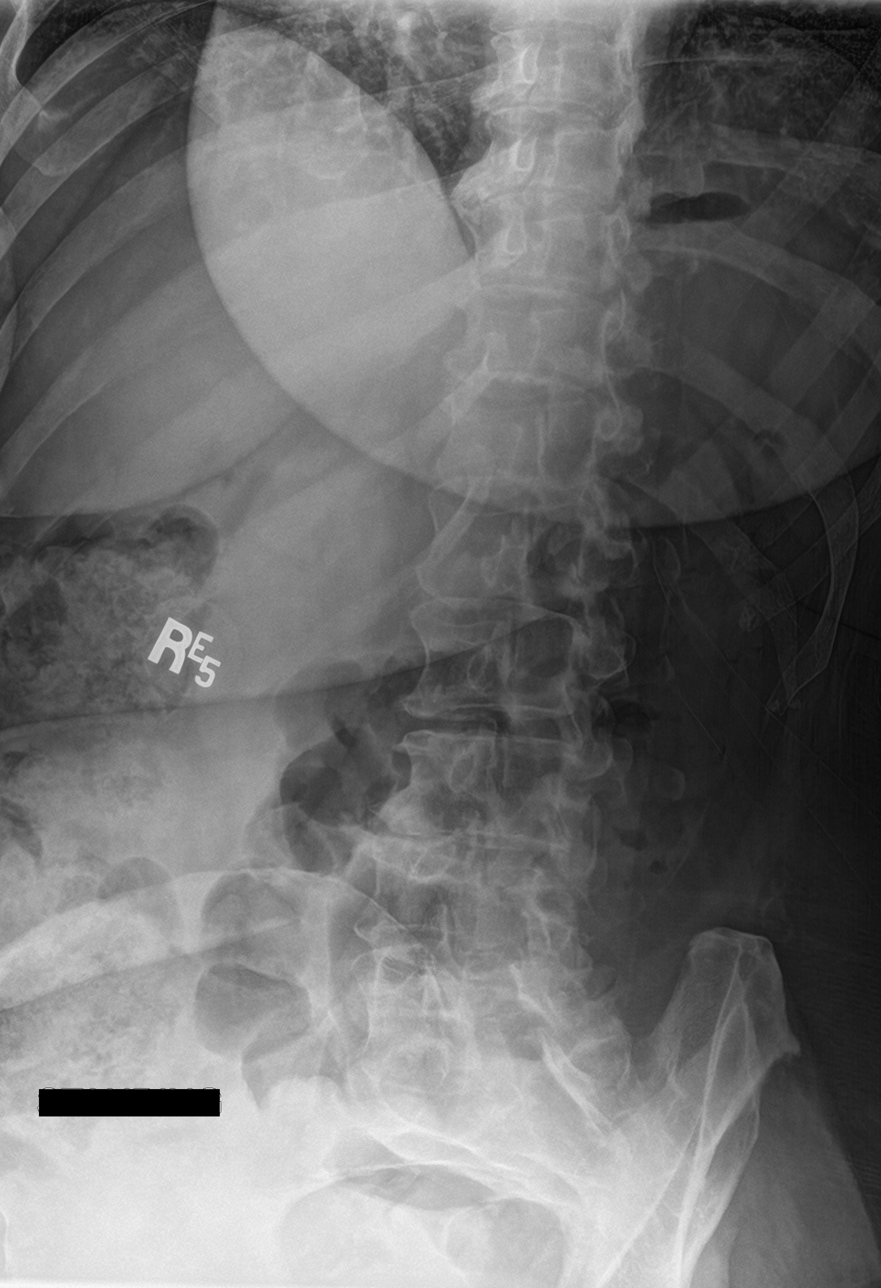

[l-spine obl (2 of 2)]
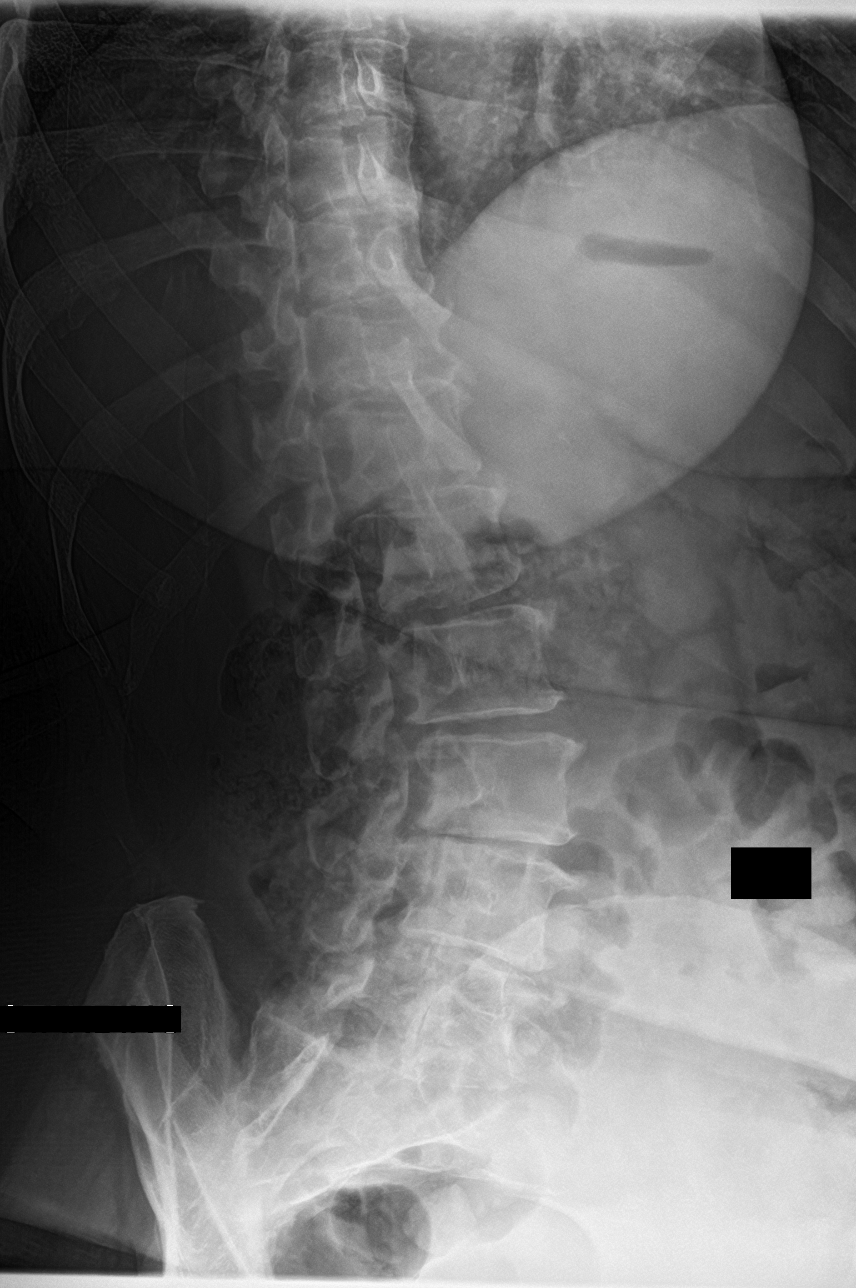

[l-spine lat]
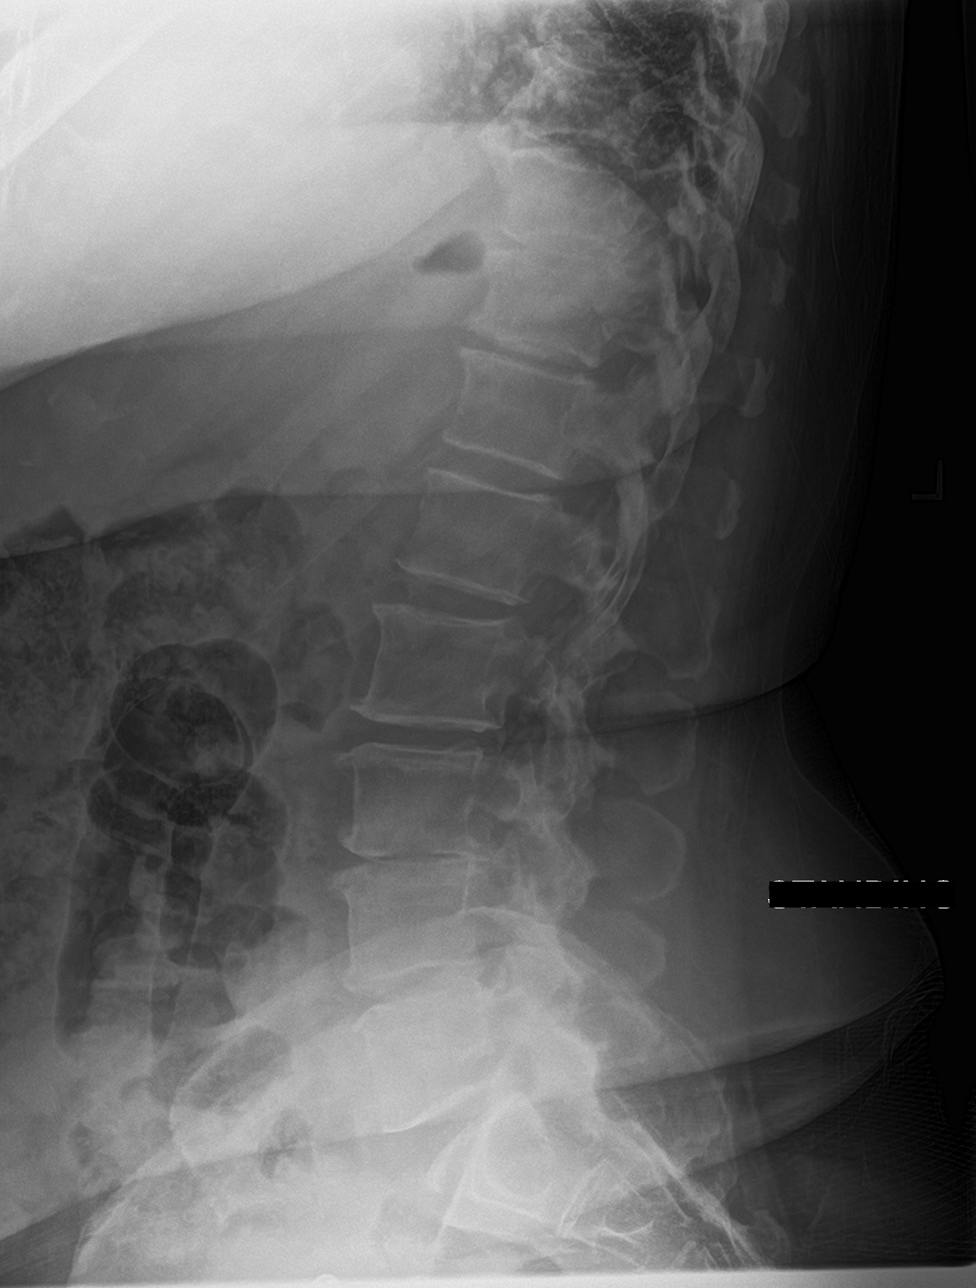

[l-spine spot]
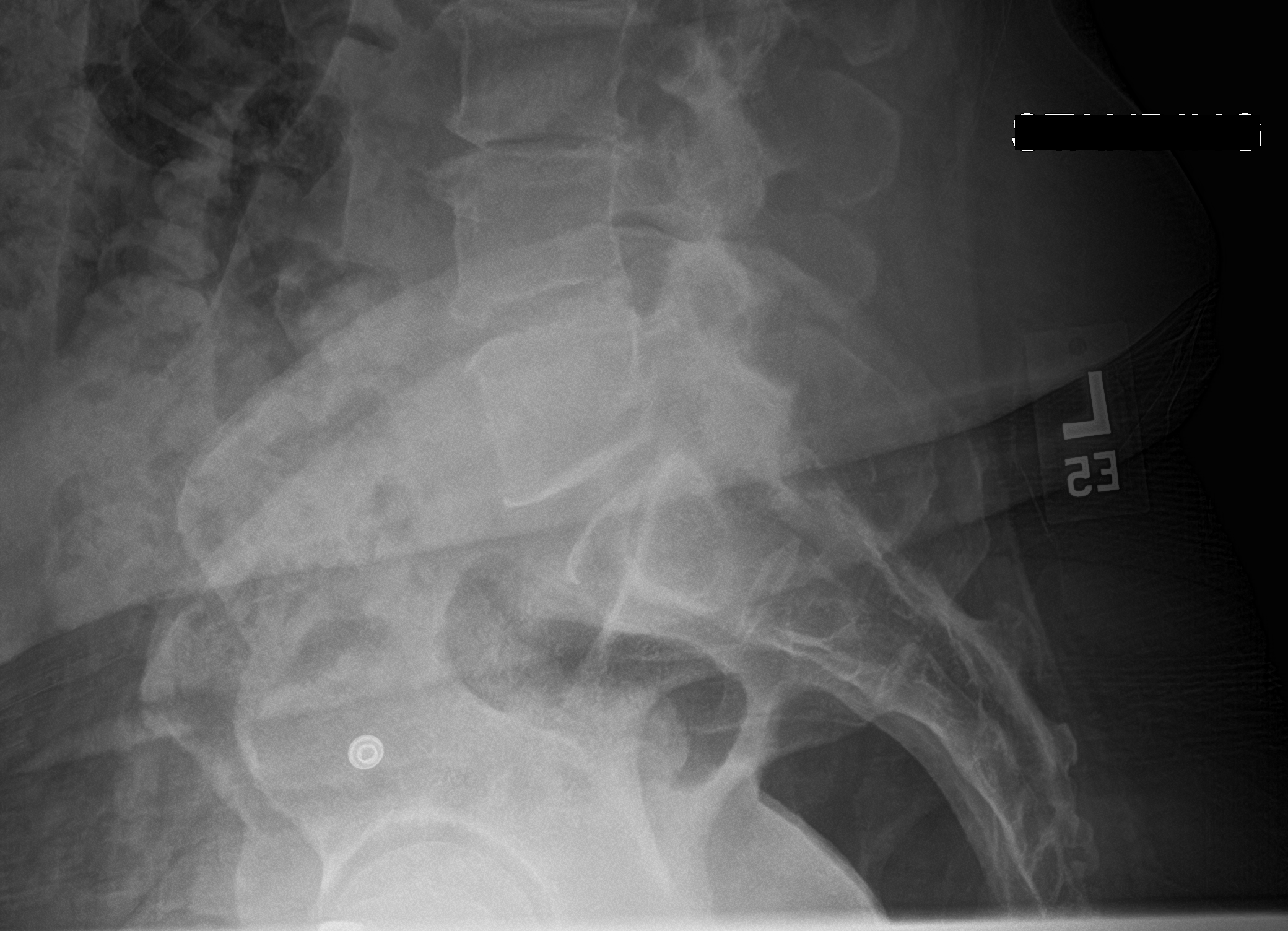

[5 of 5 positions shown; findings below may reference images not displayed]

FINDINGS: Scoliosis. Vertebral body heights are maintained. Moderate disc
space narrowing and degenerative change L3-L4 and L4-L5 with mild
degenerative changes elsewhere in the lumbar spine. Facet
degenerative changes of the lower lumbar spine.
IMPRESSION: Scoliosis and multilevel degenerative changes, slightly progressed
compared to prior radiograph

## 2022-02-07 MED ORDER — TIZANIDINE HCL 4 MG PO TABS
4.0000 mg | ORAL_TABLET | Freq: Four times a day (QID) | ORAL | 0 refills | Status: DC | PRN
  Filled 2022-02-07: qty 30, 8d supply, fill #0

## 2022-02-07 MED ORDER — TRAMADOL HCL 50 MG PO TABS
50.0000 mg | ORAL_TABLET | Freq: Three times a day (TID) | ORAL | 0 refills | Status: AC | PRN
  Filled 2022-02-07: qty 15, 5d supply, fill #0

## 2022-02-07 NOTE — Patient Instructions (Signed)

## 2022-02-07 NOTE — Progress Notes (Addendum)
? ?Subjective:  ? ?By signing my name below, I, Zite Okoli, attest that this documentation has been prepared under the direction and in the presence of Donato SchultzYvonne R Lowne Chase, DO. 02/07/2022 ? ? Patient ID: Lindsey Owen, female    DOB: 05/28/1966, 56 y.o.   MRN: [id number] ? ?Chief Complaint  ?Patient presents with  ? Back Pain  ?  Lower back pain, x2 months, pt states having pain and cramping that goes into her legs.   ? ? ?HPI ?Patient is in today for an office visit. ? ?She reports of lower back pain that has been present for a long time. Yesterday, the pain increased and radiated to her ankles. There is cramping and muscle spasms. Notes the left leg cramps worse.  The pain started when she had a fall about 15-16 years ago. MRI showed she had a bulging disk. 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  ? ?Her blood sugars have not been well controlled for the past 2 years. She has not had time due to work but 3 months ago, she has been able to reduce her workload and monitor them. Readings have been high. ? ?Past Medical History:  ?Diagnosis Date  ? Diabetes mellitus without complication (HCC)   ? Hypertension   ? NAFLD (nonalcoholic fatty liver disease)   ? Thyroid disease   ? ? ?Past Surgical History:  ?Procedure Laterality Date  ? CESAREAN SECTION  2000  ? UTERINE FIBROID SURGERY    ? ? ?Family History  ?Problem Relation Age of Onset  ? Kidney disease Mother   ?     Kidney Failure  ? Heart disease Father   ?     MI  ? ? ?Social History  ? ?Socioeconomic History  ? Marital status: Married  ?  Spouse name: nayin  ? Number of children: 4  ? Years of education: Not on file  ? Highest education level: Not on file  ?Occupational History  ? Not on file  ?Tobacco Use  ? Smoking status: Never  ? Smokeless tobacco: Never  ?Substance and Sexual Activity  ? Alcohol use: Never  ? Drug use: No  ? Sexual activity: Not on file  ?Other Topics Concern  ? Not on file  ?Social History Narrative  ?  Lives at home with husband and 4 children  ? Right handed  ? Caffeine: 1 cup of coffee in the AM  ? ?Social Determinants of Health  ? ?Financial Resource Strain: Medium Risk  ? Difficulty of Paying Living Expenses: Somewhat hard  ?Food Insecurity: Not on file  ?Transportation Needs: Not on file  ?Physical Activity: Not on file  ?Stress: Stress Concern Present  ? Feeling of Stress : Rather much  ?Social Connections: Not on file  ?Intimate Partner Violence: Not on file  ? ? ?Outpatient Medications Prior to Visit  ?Medication Sig Dispense Refill  ? amitriptyline (ELAVIL) 25 MG tablet Take 1 tablet (25 mg total) by mouth at bedtime. (Patient taking differently: Take 25 mg by mouth at bedtime as needed.) 90 tablet 2  ? Calcium Carbonate-Vit D-Min (CALCIUM 1200 PO) Take 1 tablet by mouth daily.    ? clobetasol (TEMOVATE) 0.05 % external solution Apply 1 application topically 2 (two) times daily. 50 mL 2  ? fluticasone (FLONASE) 50 MCG/ACT nasal spray Place 2 sprays into both nostrils daily. (Patient taking differently: Place 2 sprays into both nostrils daily as needed.) 16 g 2  ?  glyBURIDE (DIABETA) 5 MG tablet Take 1 tablet (5 mg total) by mouth 2 (two) times daily with a meal. 180 tablet 1  ? levothyroxine (EUTHYROX) 125 MCG tablet Take 1 tablet (125 mcg total) by mouth daily before breakfast. 90 tablet 2  ? lisinopril-hydrochlorothiazide (ZESTORETIC) 20-12.5 MG tablet Take 1 tablet by mouth daily. 90 tablet 2  ? meloxicam (MOBIC) 15 MG tablet TAKE 1 TABLET BY MOUTH ONCE DAILY 30 tablet 0  ? metFORMIN (GLUCOPHAGE-XR) 500 MG 24 hr tablet Take 2 tablets (1,000 mg total) by mouth in the morning and at bedtime. Take 1 tablet 3 times a day with a meal (can also take 2 tablets with breakfast and 1 tablet with evening meal) 360 tablet 1  ? Omega-3 Fatty Acids (FISH OIL) 1000 MG CAPS Take by mouth.    ? pioglitazone (ACTOS) 30 MG tablet Take 1 tablet (30 mg total) by mouth daily. 30 tablet 2  ? pregabalin (LYRICA) 75 MG capsule  Take 1 capsule (75 mg total) by mouth at bedtime. 90 capsule 1  ? rosuvastatin (CRESTOR) 5 MG tablet Take 1 tablet (5 mg total) by mouth daily. 90 tablet 3  ? tiZANidine (ZANAFLEX) 4 MG tablet Take 1 tablet (4 mg total) by mouth every 6 (six) hours as needed for muscle spasms. 30 tablet 0  ? ?No facility-administered medications prior to visit.  ? ? ?No Known Allergies ? ?Review of Systems  ?Constitutional:  Negative for fever.  ?HENT:  Negative for congestion, ear pain, hearing loss, sinus pain and sore throat.   ?Eyes:  Negative for blurred vision and pain.  ?Respiratory:  Negative for cough, sputum production, shortness of breath and wheezing.   ?Cardiovascular:  Negative for chest pain and palpitations.  ?Gastrointestinal:  Negative for blood in stool, constipation, diarrhea, nausea and vomiting.  ?Genitourinary:  Negative for dysuria, frequency, hematuria and urgency.  ?Musculoskeletal:  Positive for back pain. Negative for falls and myalgias.  ?     (+) cramping ?(+) muscle spasms   ?Neurological:  Positive for weakness. Negative for dizziness, sensory change, loss of consciousness and headaches.  ?Endo/Heme/Allergies:  Negative for environmental allergies. Does not bruise/bleed easily.  ?Psychiatric/Behavioral:  Negative for depression and suicidal ideas. The patient is not nervous/anxious and does not have insomnia.   ? ?   ?Objective:  ?  ?Physical Exam ?Vitals and nursing note reviewed.  ?Constitutional:   ?   General: She is not in acute distress. ?   Appearance: Normal appearance. She is not ill-appearing.  ?HENT:  ?   Head: Normocephalic and atraumatic.  ?   Right Ear: External ear normal.  ?   Left Ear: External ear normal.  ?Eyes:  ?   Extraocular Movements: Extraocular movements intact.  ?   Pupils: Pupils are equal, round, and reactive to light.  ?Cardiovascular:  ?   Rate and Rhythm: Normal rate and regular rhythm.  ?   Pulses: Normal pulses.  ?   Heart sounds: Normal heart sounds. No murmur  heard. ?  No gallop.  ?Pulmonary:  ?   Effort: Pulmonary effort is normal. No respiratory distress.  ?   Breath sounds: Normal breath sounds. No wheezing, rhonchi or rales.  ?Abdominal:  ?   General: Bowel sounds are normal. There is no distension.  ?   Palpations: Abdomen is soft. There is no mass.  ?   Tenderness: There is no abdominal tenderness. There is no guarding or rebound.  ?   Hernia: No  hernia is present.  ?Musculoskeletal:  ?   Cervical back: Normal range of motion and neck supple.  ?   Left knee: Erythema present.  ?   Comments: 2/5 strength in right leg ?3/5 strength in left leg ?Dorsiflexion left foot- 4/5, right foot 2/5 ?Decreased patellar reflexes  ?Lymphadenopathy:  ?   Cervical: No cervical adenopathy.  ?Skin: ?   General: Skin is warm and dry.  ?Neurological:  ?   Mental Status: She is alert and oriented to person, place, and time.  ?   Deep Tendon Reflexes:  ?   Reflex Scores: ?     Patellar reflexes are 1+ on the right side and 1+ on the left side. ?Psychiatric:     ?   Behavior: Behavior normal.  ? ? ?BP 128/90 (BP Location: Left Arm, Patient Position: Sitting, Cuff Size: Normal)   Pulse 87   Temp (!) 97.3 ?F (36.3 ?C) (Oral)   Resp 18   Ht 5\' 3"  (1.6 m)   Wt 171 lb 9.6 oz (77.8 kg)   SpO2 98%   BMI 30.40 kg/m?  ?Wt Readings from Last 3 Encounters:  ?02/07/22 171 lb 9.6 oz (77.8 kg)  ?01/31/22 172 lb 4 oz (78.1 kg)  ?10/04/21 171 lb 6 oz (77.7 kg)  ? ? ?Diabetic Foot Exam - Simple   ?No data filed ?  ? ?Lab Results  ?Component Value Date  ? WBC 6.2 01/31/2022  ? HGB 13.7 01/31/2022  ? HCT 41.8 01/31/2022  ? PLT 144 01/31/2022  ? GLUCOSE 395 (H) 01/31/2022  ? CHOL 149 01/31/2022  ? TRIG 250 (H) 01/31/2022  ? HDL 49 (L) 01/31/2022  ? LDLCALC 67 01/31/2022  ? ALT 49 (H) 01/31/2022  ? AST 48 (H) 01/31/2022  ? NA 134 (L) 01/31/2022  ? K 4.4 01/31/2022  ? CL 96 (L) 01/31/2022  ? CREATININE 0.72 01/31/2022  ? BUN 12 01/31/2022  ? CO2 28 01/31/2022  ? TSH 2.16 01/31/2022  ? HGBA1C 12.4 (H)  01/31/2022  ? MICROALBUR 1.3 10/07/2021  ? ? ?Lab Results  ?Component Value Date  ? TSH 2.16 01/31/2022  ? ?Lab Results  ?Component Value Date  ? WBC 6.2 01/31/2022  ? HGB 13.7 01/31/2022  ? HCT 41.8 01/31/2022  ? MC

## 2022-02-07 NOTE — Assessment & Plan Note (Signed)
Ns wanted mri prior to ov  ?c spine mri ordered  ?

## 2022-02-07 NOTE — Assessment & Plan Note (Signed)
With DDD--- suspect ruptured disc ?Mri L spine  ?Muscle relaxer refilled ?Tramadol for pain ?F/u pcp 2 weeks  ?

## 2022-02-10 ENCOUNTER — Encounter: Payer: Self-pay | Admitting: Family Medicine

## 2022-02-15 ENCOUNTER — Ambulatory Visit
Admission: RE | Admit: 2022-02-15 | Discharge: 2022-02-15 | Disposition: A | Discharge status: Home / Self Care | Payer: BC Managed Care – PPO | Source: Ambulatory Visit | Attending: Family Medicine | Admitting: Family Medicine

## 2022-02-15 DIAGNOSIS — M542 Cervicalgia: Secondary | ICD-10-CM | POA: Diagnosis not present

## 2022-02-15 DIAGNOSIS — M545 Low back pain, unspecified: Secondary | ICD-10-CM | POA: Diagnosis not present

## 2022-02-15 IMAGING — MR MR LUMBAR SPINE W/O CM
4 of 5 series · 11 of 48 positions shown · non-contrast
Comparison: [DATE]

CLINICAL DATA: Low back pain

EXAM:
MRI LUMBAR SPINE WITHOUT CONTRAST
TECHNIQUE: Multiplanar, multisequence MR imaging of the lumbar spine was
performed. No intravenous contrast was administered.

[Series 9: T2 · sagittal · 4.0mm · 0.31mm/px · 3 of 15 slices shown (1 of 2)]
[im 3/15]
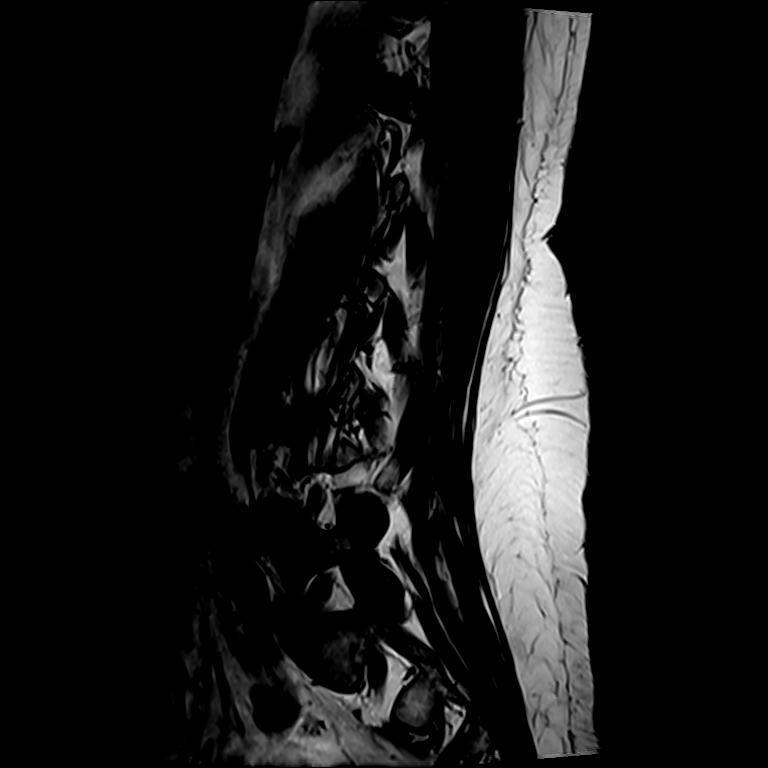
[im 9/15]
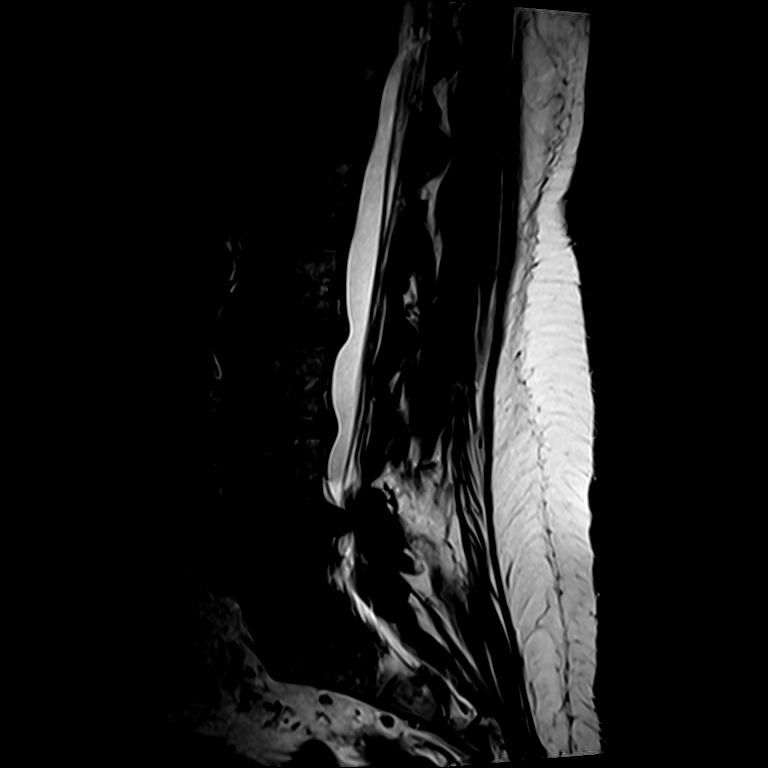
[im 15/15]
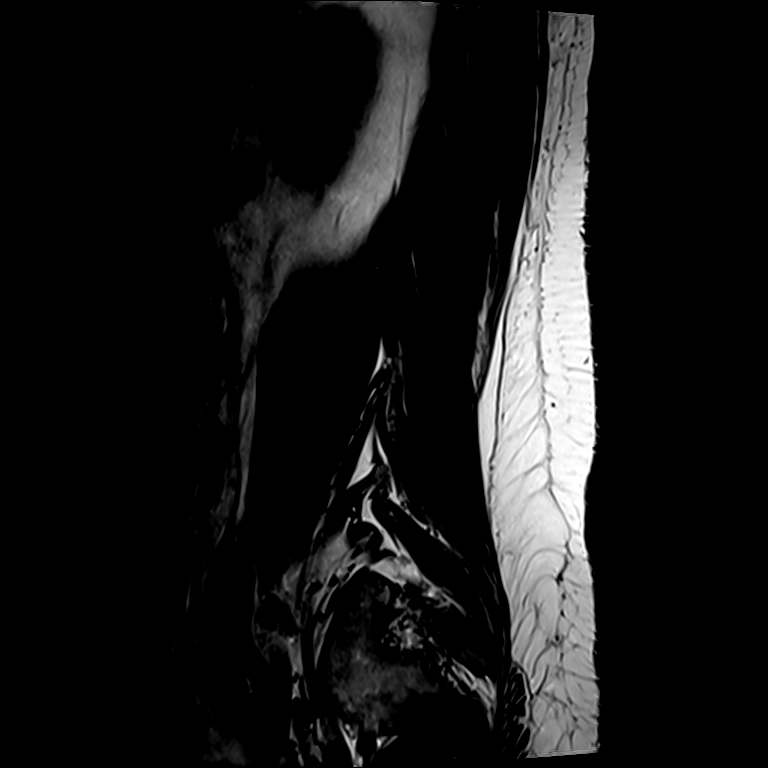

[Series 12: T1 · sagittal · 4.0mm · 0.31mm/px · 3 of 15 slices shown (1 of 2)]
[im 3/15]
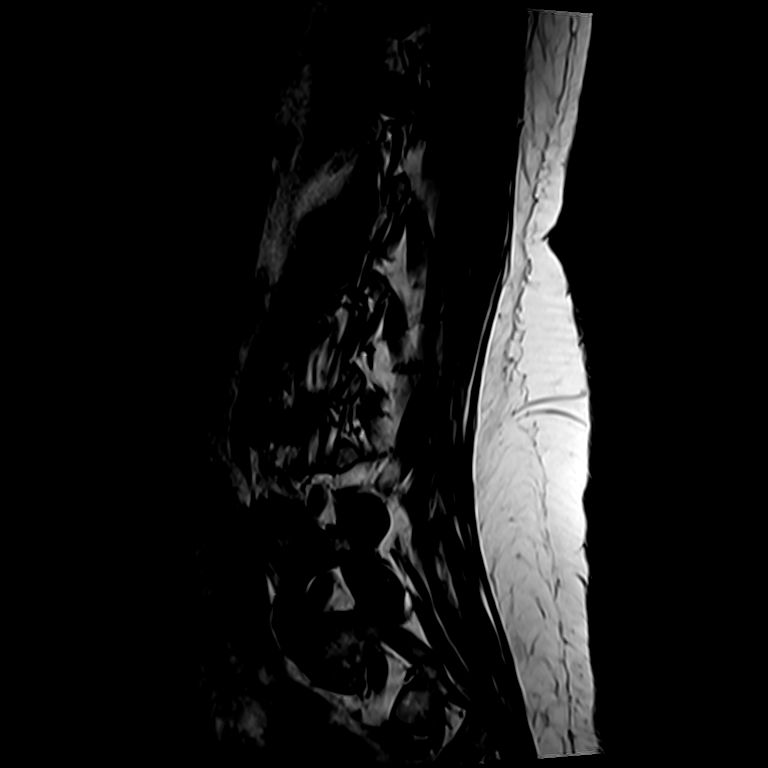
[im 9/15]
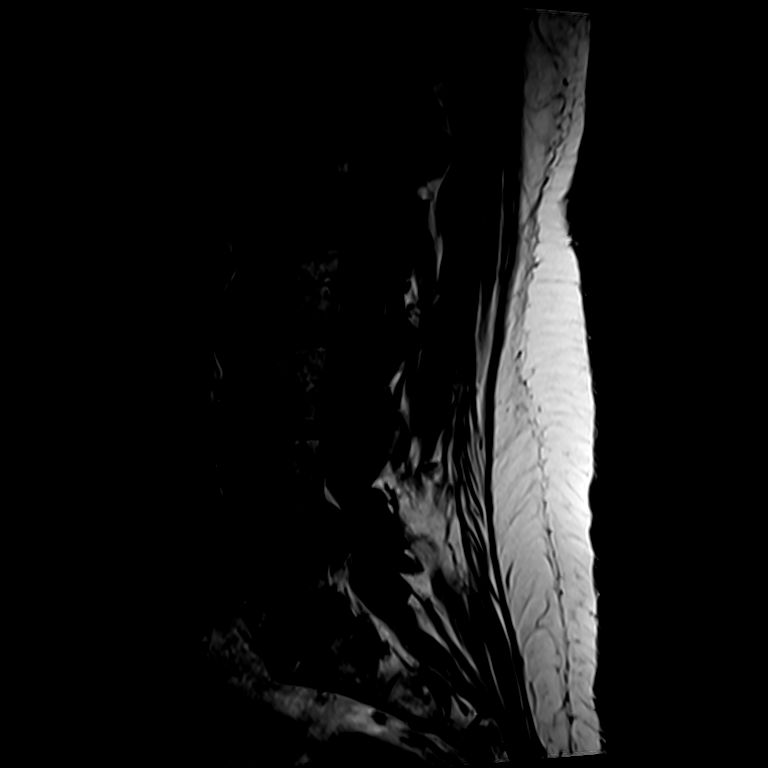
[im 15/15]
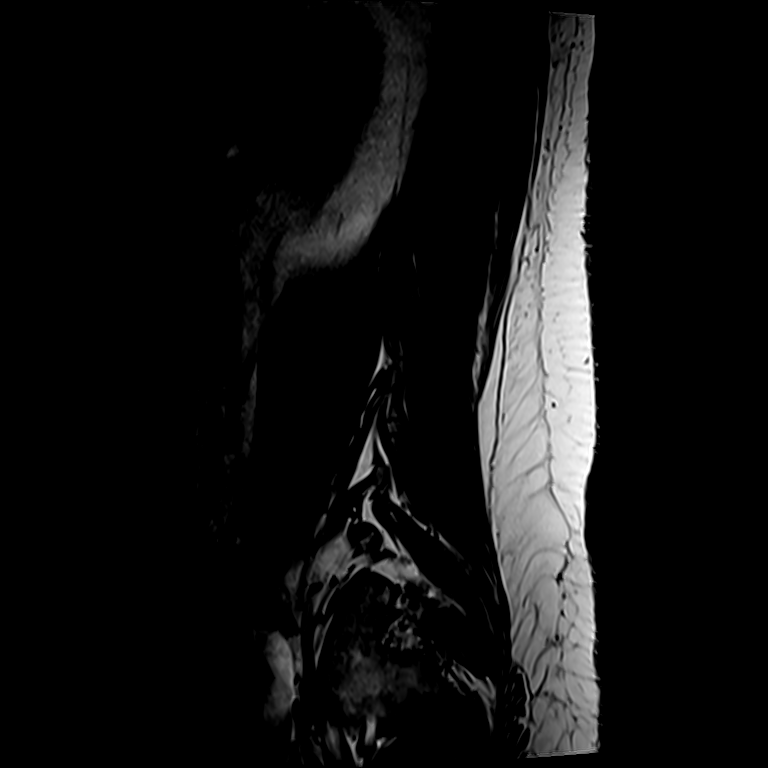

[Series 15: T1 · axial · 4.0mm · 0.35mm/px · z∈[-403,-323]mm · 2 of 39 slices shown (2 of 2)]
[im 6/39]
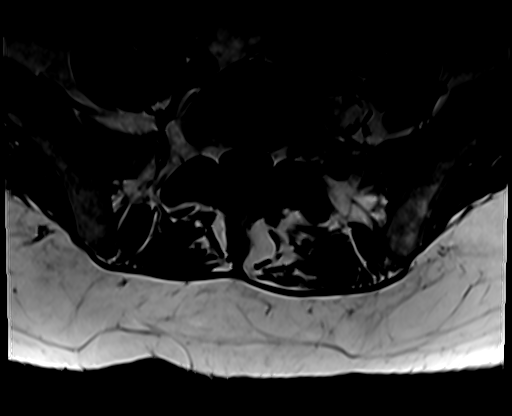
[im 20/39]
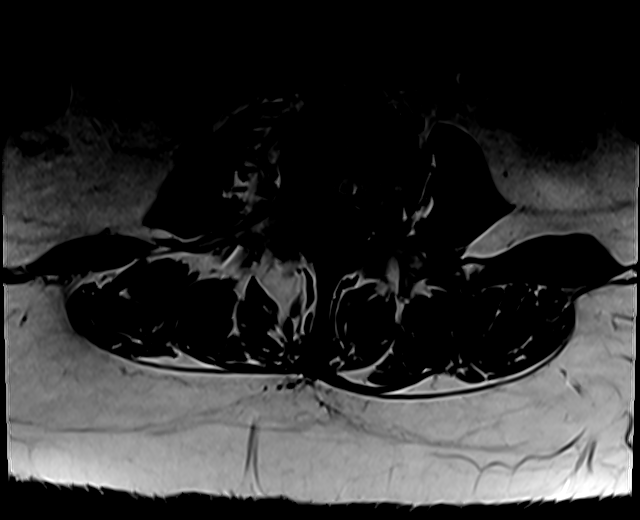

[Series 18: T2 · axial · 4.0mm · 0.35mm/px · z∈[-403,-258]mm · 3 of 39 slices shown (2 of 2)]
[im 6/39]
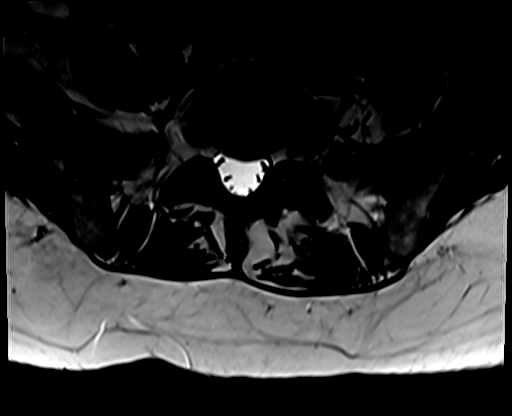
[im 20/39]
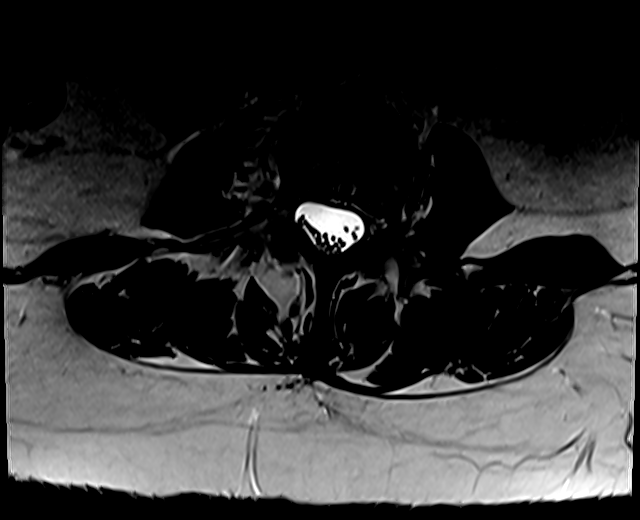
[im 33/39]
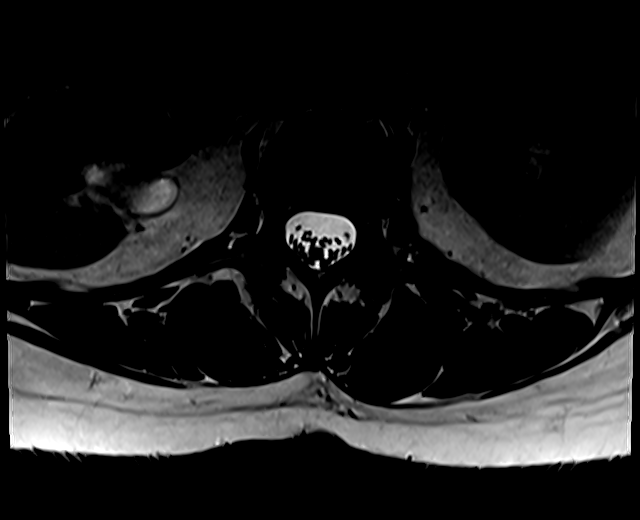

[11 of 48 positions shown; findings below may reference images not displayed]

FINDINGS: Segmentation:  Standard.

Alignment:  Levocurvature.  No listhesis.

Vertebrae: No acute fracture or suspicious osseous lesion. Endplate
degenerative changes on the right at L3-L4, likely secondary to
aforementioned curvature.

Conus medullaris and cauda equina: Conus extends to the L1 level.
Conus and cauda equina appear normal.

Paraspinal and other soft tissues: No acute finding.

Disc levels:

T12-L1: No significant disc bulge. No spinal canal stenosis or
neural foraminal narrowing.

L1-L2: No significant disc bulge. No spinal canal stenosis or neural
foraminal narrowing.

L2-L3: Mild disc bulge, which has progressed from the prior exam.
Mild facet arthropathy. No spinal canal stenosis or neural foraminal
narrowing.

L3-L4: Mild disc bulge, which has progressed slightly from the prior
exam. Narrowing of the right lateral recess and mild right neural
foraminal narrowing, which are new from prior exam. No spinal canal
stenosis.

L4-L5: Left eccentric disc bulge with left foraminal protrusion,
with effacement of the left lateral recess, moderate left facet
arthropathy, and mild-to-moderate left neural foraminal narrowing,
all of which are new from the prior exam. No spinal canal stenosis.
Mild left neural foraminal narrowing.

L5-S1: No significant disc bulge. Mild left facet arthropathy. No
spinal canal stenosis or neural foraminal narrowing.
IMPRESSION: 1. L4-L5 mild-to-moderate left neural foraminal narrowing, with
effacement of the left lateral recess, which likely compresses the
descending left L5 nerve roots.
2. L3-L4 mild right neural foraminal narrowing and narrowing of the
right lateral recess, which could affect the descending right L4
nerve roots.
3. No spinal canal stenosis.

## 2022-02-15 IMAGING — MR MR CERVICAL SPINE W/O CM
4 of 5 series · 11 of 48 positions shown · non-contrast
Comparison: [DATE]

CLINICAL DATA: Chronic neck pain, prior cervical fusion

EXAM:
MRI CERVICAL SPINE WITHOUT CONTRAST
TECHNIQUE: Multiplanar, multisequence MR imaging of the cervical spine was
performed. No intravenous contrast was administered.

[Series 6: T2 · sagittal · 3.0mm · 0.26mm/px · 3 of 15 slices shown (1 of 2)]
[im 3/15]
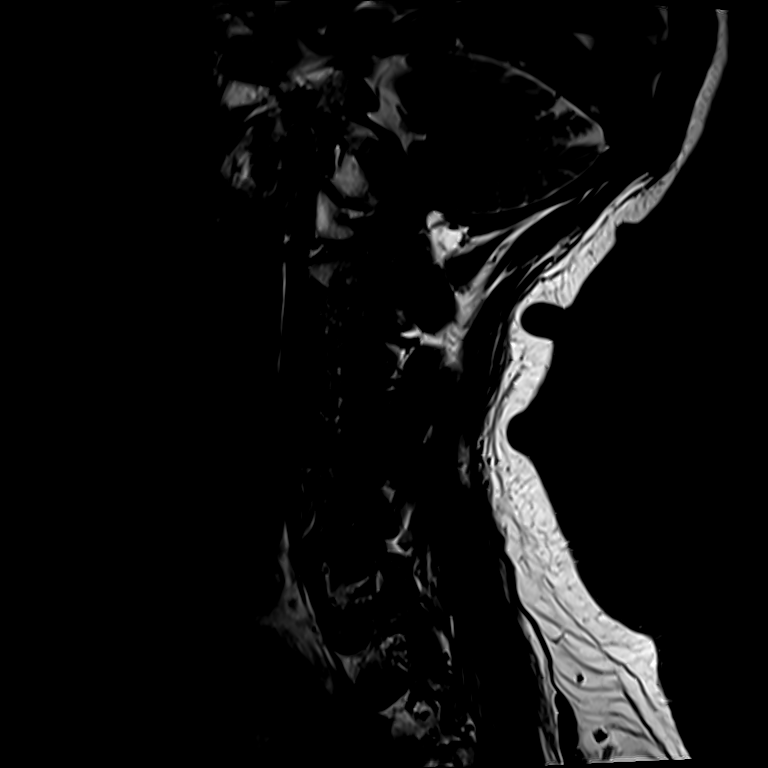
[im 8/15]
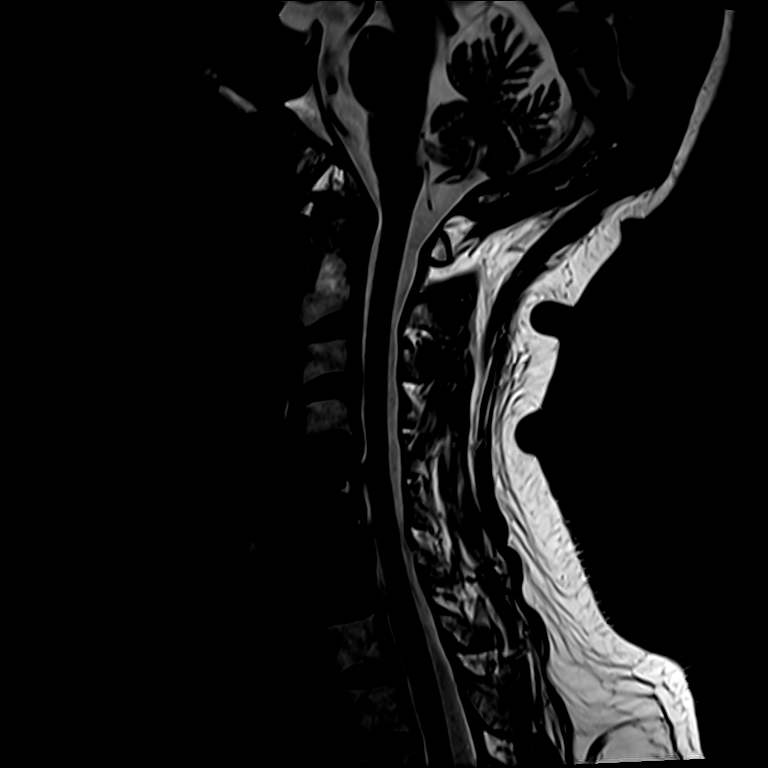
[im 12/15]
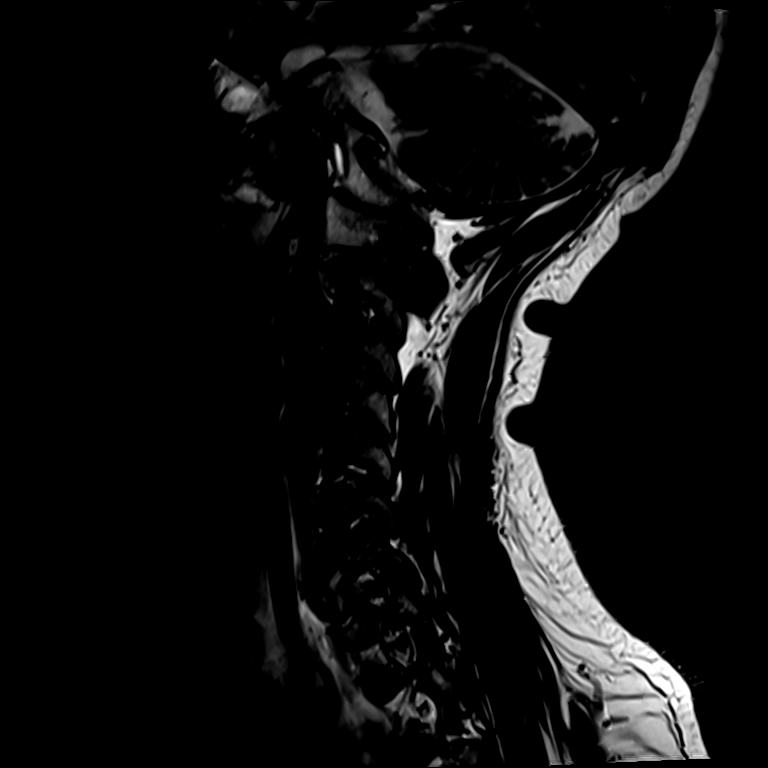

[Series 7: T1 · sagittal · 3.0mm · 0.31mm/px · 3 of 15 slices shown]
[im 3/15]
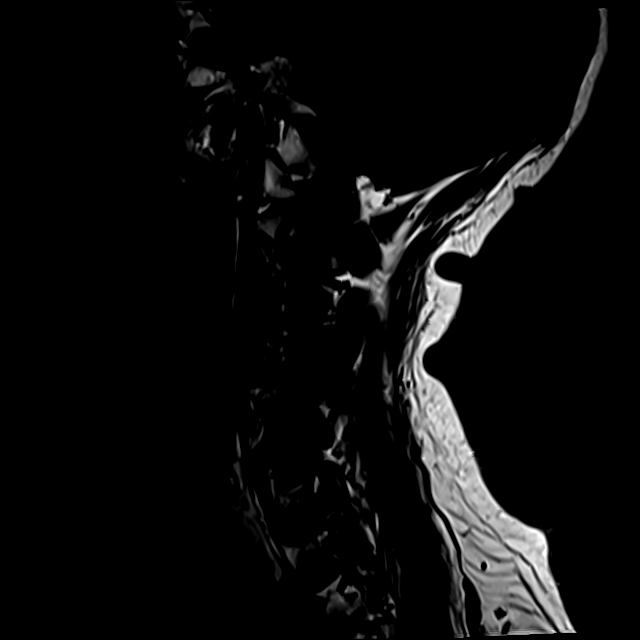
[im 8/15]
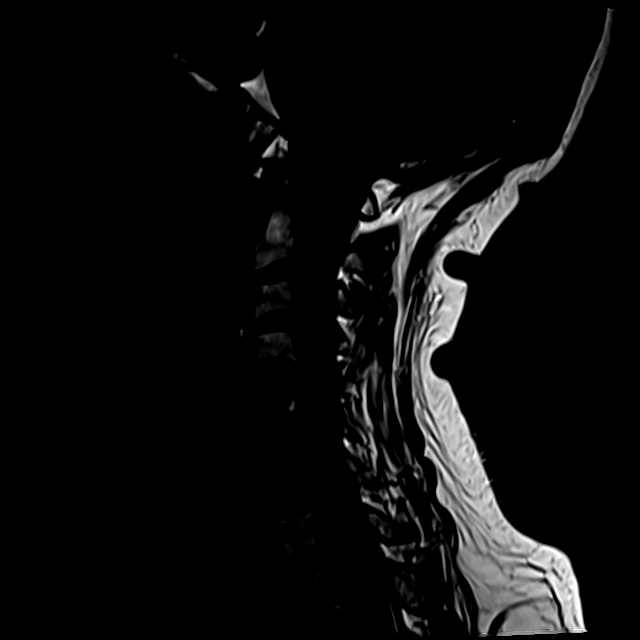
[im 12/15]
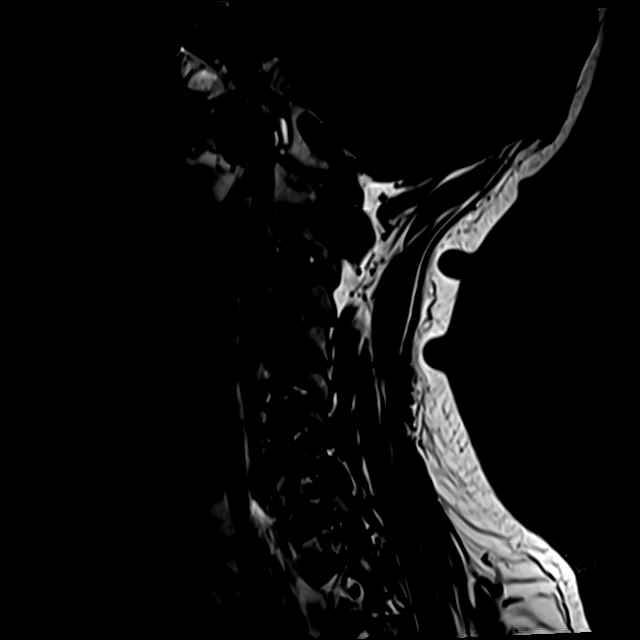

[Series 9: STIR · sagittal · 3.0mm · 0.31mm/px · 2 of 15 slices shown]
[im 3/15]
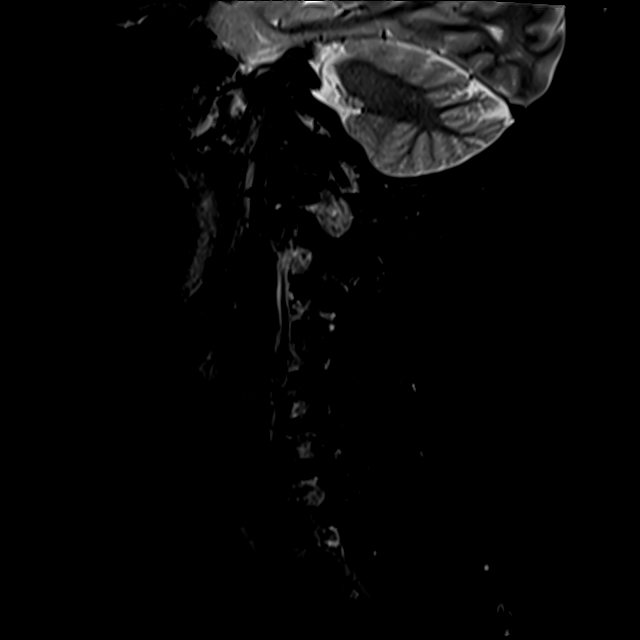
[im 9/15]
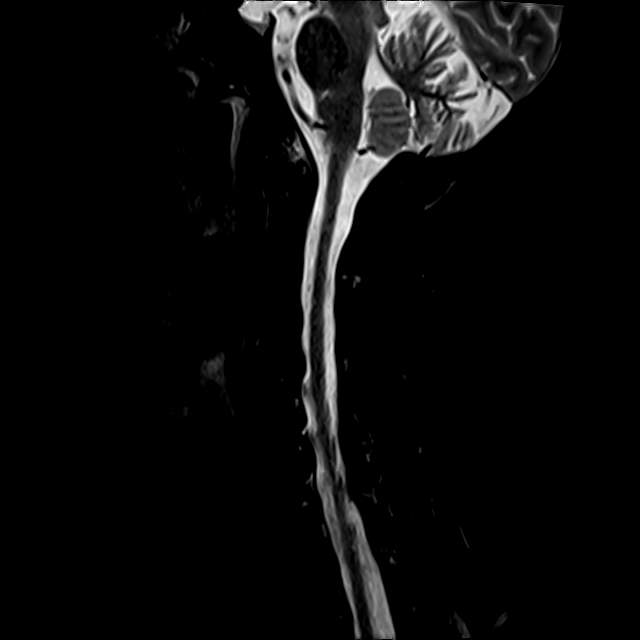

[Series 10: T2 · axial · 3.0mm · 0.25mm/px · z∈[-16,+54]mm · 3 of 34 slices shown (2 of 2)]
[im 6/34]
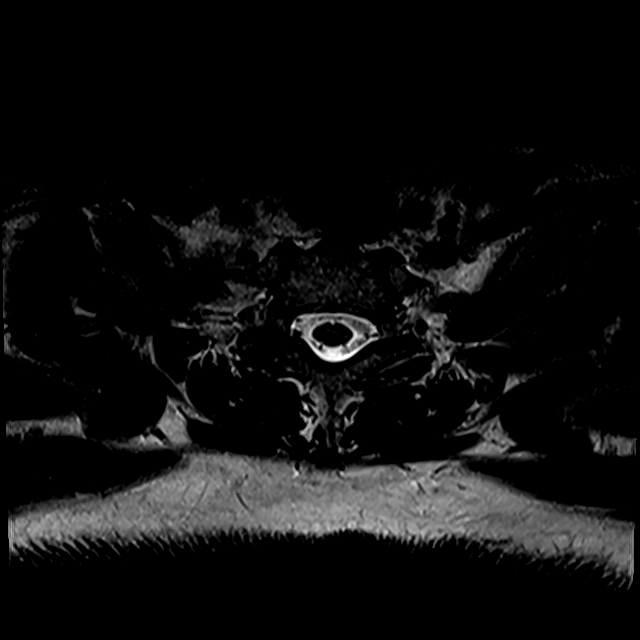
[im 18/34]
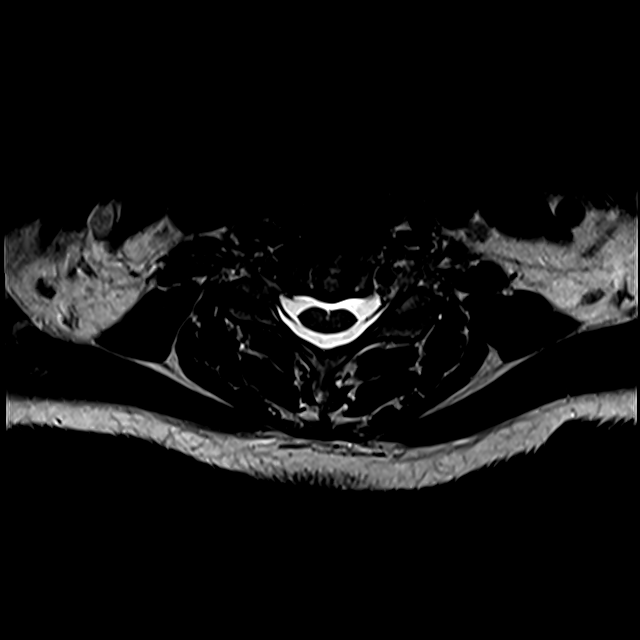
[im 28/34]
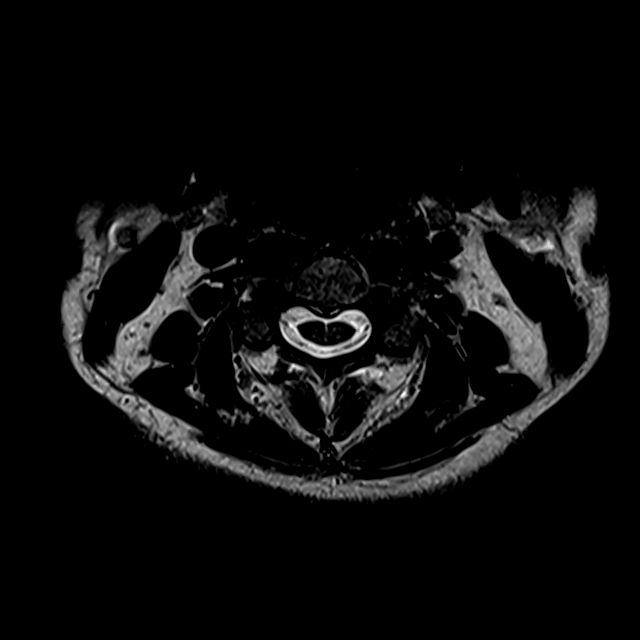

[11 of 48 positions shown; findings below may reference images not displayed]

FINDINGS: Alignment: Physiologic.

Vertebrae: Status post ACDF C5-C7. Susceptibility from the hardware
limits evaluation of these levels. No acute fracture or suspicious
osseous lesion the remaining vertebral bodies.

Cord: Normal signal and morphology.

Posterior Fossa, vertebral arteries, paraspinal tissues: Negative.

Disc levels:

C2-C3: No significant disc bulge. No spinal canal stenosis or
neuroforaminal narrowing.

C3-C4: No significant disc bulge. No spinal canal stenosis or
neuroforaminal narrowing.

C4-C5: No significant disc bulge. No spinal canal stenosis or
neuroforaminal narrowing.

C5-C6: Postsurgical level. No spinal canal stenosis or neural
foraminal narrowing.

C6-C7: Postsurgical level. Left-greater-than-right uncovertebral
hypertrophy, which causes moderate left neural foraminal narrowing,
which appears less stenosed than on [DATE]. No spinal canal
stenosis.

C7-T1: No significant disc bulge. No spinal canal stenosis or
neuroforaminal narrowing.
IMPRESSION: 1. Status post ACDF C5-C7. No spinal canal stenosis at these levels,
however there is moderate left neural foraminal narrowing at C6-C7,
although this appears less severe than on [DATE].
2. No other spinal canal stenosis or neural foraminal narrowing.

## 2022-02-21 ENCOUNTER — Encounter: Payer: Self-pay | Admitting: Family Medicine

## 2022-02-21 ENCOUNTER — Ambulatory Visit (INDEPENDENT_AMBULATORY_CARE_PROVIDER_SITE_OTHER): Payer: BC Managed Care – PPO | Admitting: Family Medicine

## 2022-02-21 ENCOUNTER — Other Ambulatory Visit (HOSPITAL_BASED_OUTPATIENT_CLINIC_OR_DEPARTMENT_OTHER): Payer: Self-pay

## 2022-02-21 VITALS — BP 108/62 | HR 87 | Temp 98.0°F | Ht 63.0 in | Wt 171.4 lb

## 2022-02-21 DIAGNOSIS — G8929 Other chronic pain: Secondary | ICD-10-CM | POA: Diagnosis not present

## 2022-02-21 DIAGNOSIS — J301 Allergic rhinitis due to pollen: Secondary | ICD-10-CM

## 2022-02-21 DIAGNOSIS — M48061 Spinal stenosis, lumbar region without neurogenic claudication: Secondary | ICD-10-CM | POA: Diagnosis not present

## 2022-02-21 DIAGNOSIS — M545 Low back pain, unspecified: Secondary | ICD-10-CM

## 2022-02-21 MED ORDER — NAPROXEN 500 MG PO TABS: 500.0000 mg | ORAL_TABLET | Freq: Two times a day (BID) | ORAL | 2 refills | Status: DC

## 2022-02-21 MED ORDER — METHOCARBAMOL 500 MG PO TABS
500.0000 mg | ORAL_TABLET | Freq: Three times a day (TID) | ORAL | 2 refills | Status: DC | PRN
  Filled 2022-02-21: qty 60, 20d supply, fill #0

## 2022-02-21 MED ORDER — KETOTIFEN FUMARATE 0.025 % OP SOLN
1.0000 [drp] | Freq: Two times a day (BID) | OPHTHALMIC | 0 refills | Status: DC
  Filled 2022-02-21: qty 5, 50d supply, fill #0

## 2022-02-21 NOTE — Progress Notes (Signed)
Chief Complaint  ?Patient presents with  ? Follow-up  ? ? ?Subjective: ?Patient is a 56 y.o. female here for follow-up. ? ?Patient has a history of chronic low back pain.  She works at a [employer] where she is on her feet frequently.  Her pain has been radiating into both of her legs, worse in the right.  She was seen around 3 weeks ago and an MRI was ordered showing bilateral foraminal degeneration with possible compression.  She denies any bowel/bladder incontinence.  She has been using Tylenol and meloxicam at home with little relief.  She was given tramadol as well that does not particularly help.  Zanaflex helps but she can only take it at night as it makes her drowsy.  She is not having any other neurologic signs or symptoms. ? ?Patient has allergies to pollen.  She has been using Flonase and oral antihistamine which helps, but does little to help with her itchy eyes.  There is no drainage, pain, or vision loss.  She has never used an eyedrop for allergies. ? ?Past Medical History:  ?Diagnosis Date  ? Diabetes mellitus without complication ([place])   ? Hypertension   ? NAFLD (nonalcoholic fatty liver disease)   ? Thyroid disease   ? ? ?Objective: ?BP 108/62   Pulse 87   Temp 98 ?F (36.7 ?C) (Oral)   Ht 5\' 3"  (1.6 m)   Wt 171 lb 6 oz (77.7 kg)   SpO2 98%   BMI 30.36 kg/m?  ?General: Awake, appears stated age ?Eyes: Sclera white, EOMi ?Heart: RRR, no LE edema ?Lungs: CTAB, no rales, wheezes or rhonchi. No accessory muscle use ?MSK: TTP in the thoracolumbar region over the paraspinal musculature bilaterally, no midline tenderness, no SI joint tenderness, tight hamstrings bilaterally ?Neuro: Gait is antalgic, negative straight leg bilaterally, DTRs equal and symmetric in the lower extremities without clonus, no cerebellar signs, 4/5 strength with hip flexion bilaterally, limited secondary to pain ?Psych: Age appropriate judgment and insight, normal affect and mood ? ?Assessment and Plan: ?Foraminal stenosis  of lumbar region - Plan: Ambulatory referral to Physical Medicine Rehab ? ?Chronic bilateral low back pain, unspecified whether sciatica present - Plan: Ambulatory referral to Physical Medicine Rehab, naproxen (NAPROSYN) 500 MG tablet, methocarbamol (ROBAXIN) 500 MG tablet ? ?Seasonal allergic rhinitis due to pollen - Plan: ketotifen (ZADITOR) 0.025 % ophthalmic solution ? ?1/2.  Chronic, not controlled.  She would like to pursue chiropractic therapy.  I think this could be helpful but not significantly in the short-term.  Will refer to physical medicine and rehabilitation for potential injections as she has had this in the past and it did not work, but she does not like the idea of needing an injection.  Heat, ice, Tylenol, change tizanidine to Robaxin to see if it is less sedating.  Change meloxicam to Naprosyn.  Would favor avoiding narcotics. ?3.  Chronic, not controlled.  Continue Flonase and oral antihistamine.  Add Zaditor drops. ?The patient voiced understanding and agreement to the plan. ? ?I spent 30 minutes with the patient discussing the above plans in addition to reviewing her chart/imaging on the same day of the visit. ? ?Shelda Pal, DO ?02/21/22  ?11:42 AM ? ? ? ? ?

## 2022-02-21 NOTE — Patient Instructions (Signed)
If you do not hear anything about your referral in the next 1-2 weeks, call our office and ask for an update.  Heat (pad or rice pillow in microwave) over affected area, 10-15 minutes twice daily.   Ice/cold pack over area for 10-15 min twice daily.  OK to take Tylenol 1000 mg (2 extra strength tabs) or 975 mg (3 regular strength tabs) every 6 hours as needed.   Let us know if you need anything. 

## 2022-02-26 DIAGNOSIS — M9903 Segmental and somatic dysfunction of lumbar region: Secondary | ICD-10-CM | POA: Diagnosis not present

## 2022-02-27 DIAGNOSIS — M9903 Segmental and somatic dysfunction of lumbar region: Secondary | ICD-10-CM | POA: Diagnosis not present

## 2022-03-03 DIAGNOSIS — M9903 Segmental and somatic dysfunction of lumbar region: Secondary | ICD-10-CM | POA: Diagnosis not present

## 2022-03-04 ENCOUNTER — Encounter: Payer: Self-pay | Admitting: Physical Medicine & Rehabilitation

## 2022-03-04 DIAGNOSIS — M9903 Segmental and somatic dysfunction of lumbar region: Secondary | ICD-10-CM | POA: Diagnosis not present

## 2022-03-05 DIAGNOSIS — M9903 Segmental and somatic dysfunction of lumbar region: Secondary | ICD-10-CM | POA: Diagnosis not present

## 2022-03-07 ENCOUNTER — Ambulatory Visit (INDEPENDENT_AMBULATORY_CARE_PROVIDER_SITE_OTHER): Payer: BC Managed Care – PPO | Admitting: Family Medicine

## 2022-03-07 ENCOUNTER — Ambulatory Visit: Payer: BC Managed Care – PPO | Admitting: Family Medicine

## 2022-03-07 ENCOUNTER — Encounter: Payer: Self-pay | Admitting: Family Medicine

## 2022-03-07 ENCOUNTER — Telehealth: Payer: Self-pay | Admitting: Family Medicine

## 2022-03-07 VITALS — BP 114/70 | HR 96 | Temp 97.8°F | Ht 62.0 in | Wt 170.4 lb

## 2022-03-07 DIAGNOSIS — R431 Parosmia: Secondary | ICD-10-CM | POA: Diagnosis not present

## 2022-03-07 DIAGNOSIS — L299 Pruritus, unspecified: Secondary | ICD-10-CM

## 2022-03-07 DIAGNOSIS — E1165 Type 2 diabetes mellitus with hyperglycemia: Secondary | ICD-10-CM

## 2022-03-07 MED ORDER — CLOBETASOL PROPIONATE 0.05 % EX SOLN: 1.0000 "application " | Freq: Two times a day (BID) | CUTANEOUS | 2 refills | Status: AC

## 2022-03-07 MED ORDER — BUDESONIDE 32 MCG/ACT NA SUSP: 2.0000 | Freq: Every day | NASAL | 2 refills | Status: DC

## 2022-03-07 MED ORDER — TIRZEPATIDE 5 MG/0.5ML ~~LOC~~ SOAJ: 5.0000 mg | SUBCUTANEOUS | 2 refills | Status: DC

## 2022-03-07 MED ORDER — TRIAMCINOLONE ACETONIDE 0.1 % EX CREA
1.0000 | TOPICAL_CREAM | Freq: Two times a day (BID) | CUTANEOUS | 0 refills | Status: DC
Start: 2022-03-07 — End: 2022-07-11

## 2022-03-07 NOTE — Patient Instructions (Addendum)
Keep the diet clean and stay active.  Let me know if the meds are too expensive.   Reach out to your neurologist and ask about the smell clinic.   Rhinocort and Nasonex are OTC. Consider them if rx is too expensive.  Foods that may reduce pain: 1) Ginger 2) Blueberries 3) Salmon 4) Pumpkin seeds 5) dark chocolate 6) turmeric 7) tart cherries 8) virgin olive oil 9) chilli peppers 10) mint 11) red wine  Let us know if you need anything.

## 2022-03-07 NOTE — Telephone Encounter (Signed)
Initiated PA for [employer] KEY: [id number] Received approval. Patient aware

## 2022-03-07 NOTE — Progress Notes (Signed)
Chief Complaint  Lindsey Owen presents with   complete form    Subjective: Lindsey Owen is a 56 y.o. female here for f/u.  Lindsey Owen has a history of uncontrolled diabetes.  Home sugar readings have been ranging from the 200s to the 400s.  Anytime she eats anything sugary, it spikes significantly.  Reports compliance with Actos 30 mg daily, metformin XR 1000 mg twice daily.  She is scheduled to see an endocrinologist.  Diet is healthy, she does not exercise routinely, though she is starting to walk.  No chest pain or shortness of breath.  Historically she was not interested in any injectable medication but after speaking with her husband, she would like a weekly 1 if possible.  Continues to have phantom smells randomly after her most recent covid vaccination. ENT eval was neg. MRI brain w neuro was neg. She used to do [employer] but that stopped working. Was reading online and someone recommended checking hormones, but unsure which ones.   Past Medical History:  Diagnosis Date   Diabetes mellitus without complication (HCC)    Hypertension    NAFLD (nonalcoholic fatty liver disease)    Thyroid disease     Objective: BP 114/70   Pulse 96   Temp 97.8 F (36.6 C) (Oral)   Ht 5\' 2"  (1.575 m)   Wt 170 lb 6 oz (77.3 kg)   SpO2 99%   BMI 31.16 kg/m  General: Awake, appears stated age Heart: RRR, no LE edema Lungs: CTAB, no rales, wheezes or rhonchi. No accessory muscle use Psych: Age appropriate judgment and insight, normal affect and mood  Assessment and Plan: Type 2 diabetes mellitus with hyperglycemia, without long-term current use of insulin (HCC) - Plan: tirzepatide (MOUNJARO) 5 MG/0.5ML Pen  Itchy scalp - Plan: clobetasol (TEMOVATE) 0.05 % external solution  Parosmia - Plan: budesonide (RHINOCORT ALLERGY) 32 MCG/ACT nasal spray  Chronic, unstable. Cont Metformin XR 1000 mg bid, Actos 30 mg/d. Add Mounjaro 5 mg/week. F/u as originally scheduled w endo. Refill above solution.  Chronic,  uncontrolled. Try changing Flonase to Nasonex or Rhinocort depending on price. Will reach out to neuro to discuss smell clinic which she has not heard anything about. I am unsure of any hormones that would cause this.  Form completed, should be able to do [facility] based on what she is telling me.  The Lindsey Owen voiced understanding and agreement to the plan.  [place], DO 03/07/22  4:24 PM

## 2022-03-09 ENCOUNTER — Encounter: Payer: Self-pay | Admitting: Family Medicine

## 2022-03-09 DIAGNOSIS — E1169 Type 2 diabetes mellitus with other specified complication: Secondary | ICD-10-CM

## 2022-03-10 ENCOUNTER — Telehealth: Payer: Self-pay | Admitting: Family Medicine

## 2022-03-10 DIAGNOSIS — M9903 Segmental and somatic dysfunction of lumbar region: Secondary | ICD-10-CM | POA: Diagnosis not present

## 2022-03-10 NOTE — Telephone Encounter (Signed)
Patient informed we do not do blood types. And gave her PCP's NPI number for paperwork

## 2022-03-10 NOTE — Telephone Encounter (Signed)
Pt called stating they wanted to know their blood type and had a couple of questions regarding that.

## 2022-03-11 ENCOUNTER — Other Ambulatory Visit: Payer: Self-pay | Admitting: Family Medicine

## 2022-03-11 DIAGNOSIS — Z0189 Encounter for other specified special examinations: Secondary | ICD-10-CM

## 2022-03-11 DIAGNOSIS — M9903 Segmental and somatic dysfunction of lumbar region: Secondary | ICD-10-CM | POA: Diagnosis not present

## 2022-03-12 DIAGNOSIS — M9903 Segmental and somatic dysfunction of lumbar region: Secondary | ICD-10-CM | POA: Diagnosis not present

## 2022-03-14 ENCOUNTER — Other Ambulatory Visit (INDEPENDENT_AMBULATORY_CARE_PROVIDER_SITE_OTHER): Payer: BC Managed Care – PPO

## 2022-03-14 DIAGNOSIS — Z0189 Encounter for other specified special examinations: Secondary | ICD-10-CM | POA: Diagnosis not present

## 2022-03-14 DIAGNOSIS — E785 Hyperlipidemia, unspecified: Secondary | ICD-10-CM

## 2022-03-14 LAB — LIPID PANEL
Cholesterol: 137 mg/dL (ref 0–200)
HDL: 47 mg/dL (ref >39.00)
LDL Cholesterol: 53 mg/dL (ref 0–99)
NonHDL: 90.2
Total CHOL/HDL Ratio: 3
Triglycerides: 184 mg/dL — ABNORMAL HIGH (ref 0.0–149.0)
VLDL: 36.8 mg/dL (ref 0.0–40.0)

## 2022-03-15 LAB — ABO AND RH

## 2022-03-18 ENCOUNTER — Emergency Department (HOSPITAL_BASED_OUTPATIENT_CLINIC_OR_DEPARTMENT_OTHER): Payer: BC Managed Care – PPO

## 2022-03-18 ENCOUNTER — Encounter (HOSPITAL_BASED_OUTPATIENT_CLINIC_OR_DEPARTMENT_OTHER): Payer: Self-pay | Admitting: Emergency Medicine

## 2022-03-18 ENCOUNTER — Other Ambulatory Visit: Payer: Self-pay

## 2022-03-18 ENCOUNTER — Emergency Department (HOSPITAL_BASED_OUTPATIENT_CLINIC_OR_DEPARTMENT_OTHER)
Admission: EM | Admit: 2022-03-18 | Discharge: 2022-03-19 | Disposition: A | Discharge status: Home / Self Care | Payer: BC Managed Care – PPO | Attending: Emergency Medicine | Admitting: Emergency Medicine

## 2022-03-18 DIAGNOSIS — R079 Chest pain, unspecified: Secondary | ICD-10-CM | POA: Diagnosis not present

## 2022-03-18 DIAGNOSIS — R0789 Other chest pain: Secondary | ICD-10-CM | POA: Diagnosis not present

## 2022-03-18 DIAGNOSIS — R112 Nausea with vomiting, unspecified: Secondary | ICD-10-CM | POA: Insufficient documentation

## 2022-03-18 LAB — CBC
HCT: 48 % — ABNORMAL HIGH (ref 36.0–46.0)
Hemoglobin: 16.1 g/dL — ABNORMAL HIGH (ref 12.0–15.0)
MCH: 29 pg (ref 26.0–34.0)
MCHC: 33.5 g/dL (ref 30.0–36.0)
MCV: 86.3 fL (ref 80.0–100.0)
Platelets: 221 10*3/uL (ref 150–400)
RBC: 5.56 MIL/uL — ABNORMAL HIGH (ref 3.87–5.11)
RDW: 12.3 % (ref 11.5–15.5)
WBC: 8.7 10*3/uL (ref 4.0–10.5)
nRBC: 0 % (ref 0.0–0.2)

## 2022-03-18 LAB — BASIC METABOLIC PANEL
Anion gap: 11 (ref 5–15)
BUN: 11 mg/dL (ref 6–20)
CO2: 31 mmol/L (ref 22–32)
Calcium: 9.7 mg/dL (ref 8.9–10.3)
Chloride: 93 mmol/L — ABNORMAL LOW (ref 98–111)
Creatinine, Ser: 0.75 mg/dL (ref 0.44–1.00)
GFR, Estimated: >60 mL/min (ref >60)
Glucose, Bld: 167 mg/dL — ABNORMAL HIGH (ref 70–99)
Potassium: 4.2 mmol/L (ref 3.5–5.1)
Sodium: 135 mmol/L (ref 135–145)

## 2022-03-18 LAB — CBG MONITORING, ED: Glucose-Capillary: 193 mg/dL — ABNORMAL HIGH (ref 70–99)

## 2022-03-18 IMAGING — DX DG CHEST 1V PORT
1 series · 1 of 1 positions shown · non-contrast
Comparison: Chest x-ray [DATE]

CLINICAL DATA: Pain.

EXAM:
PORTABLE CHEST 1 VIEW

[chest ap]
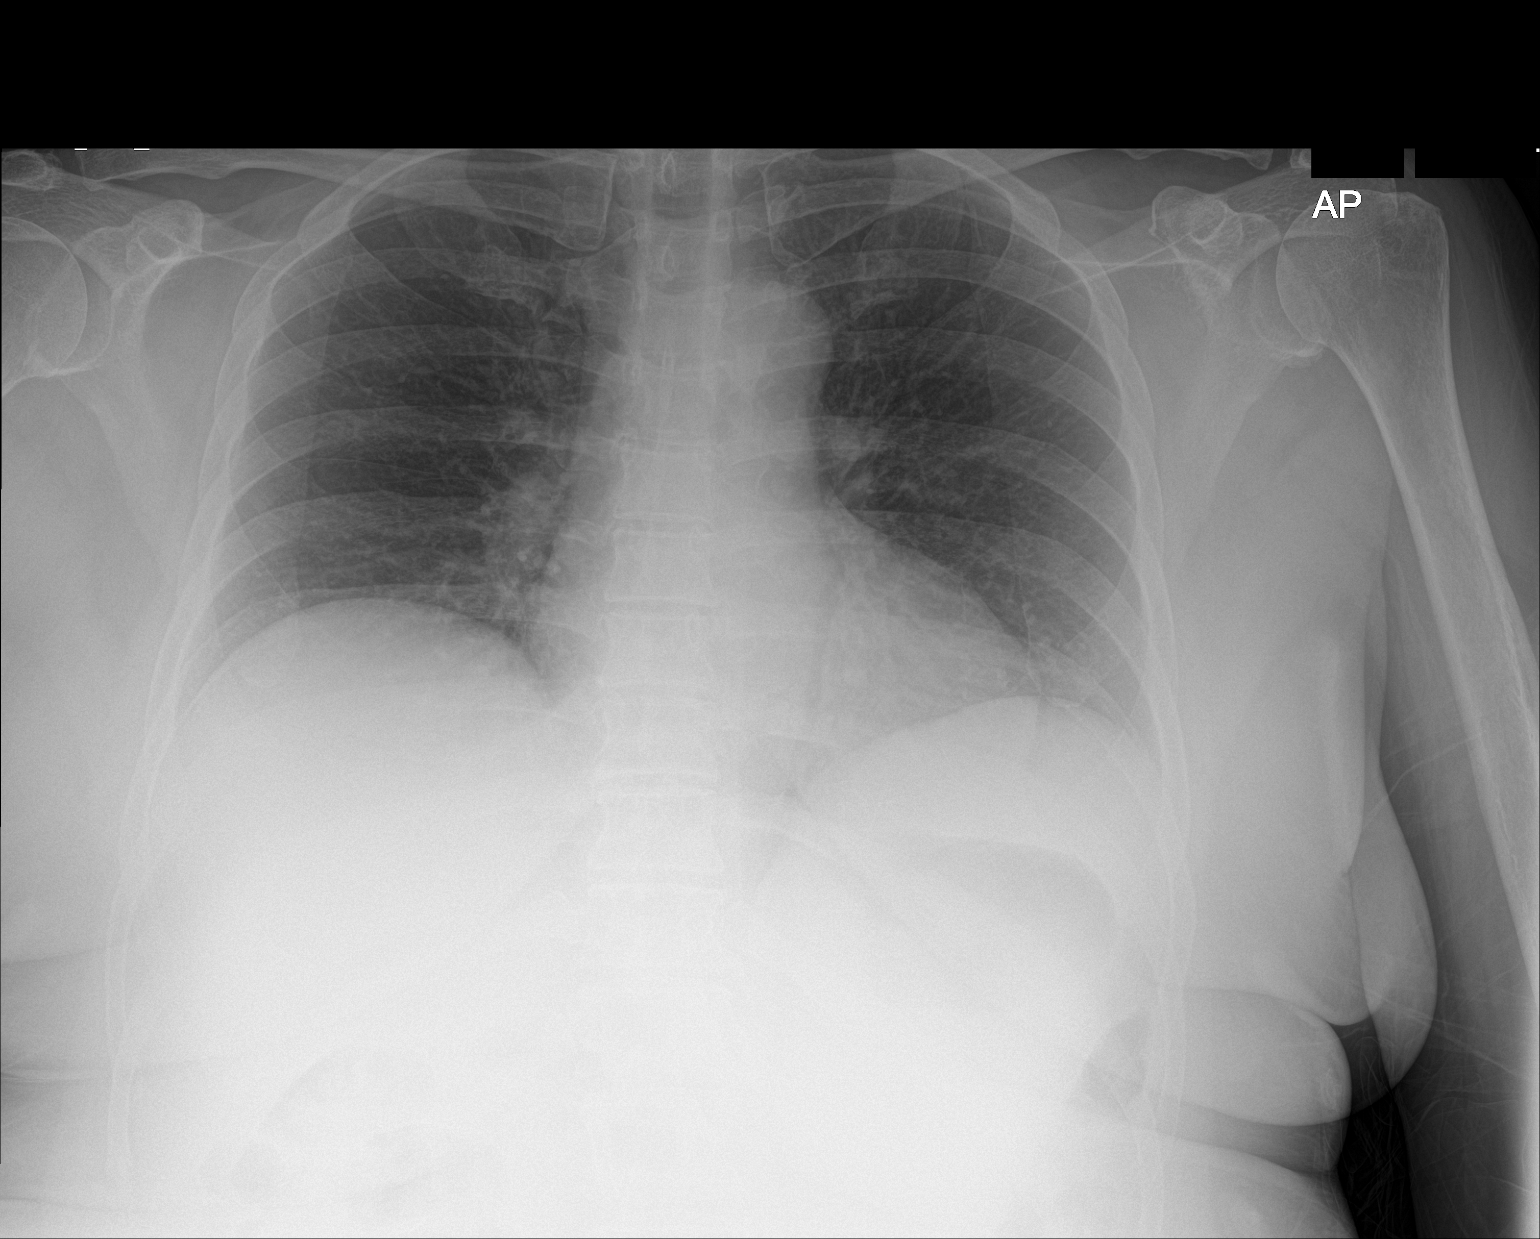

[1 of 1 positions shown; findings below may reference images not displayed]

FINDINGS: The heart size and mediastinal contours are within normal limits.
Both lungs are clear. Cervical spinal fusion plate is present. No
acute fractures are seen.
IMPRESSION: No active disease.

## 2022-03-18 MED ORDER — SODIUM CHLORIDE 0.9 % IV BOLUS
500.0000 mL | Freq: Once | INTRAVENOUS | Status: AC
  Administered 2022-03-18: 500 mL via INTRAVENOUS

## 2022-03-18 MED ORDER — ONDANSETRON HCL 4 MG/2ML IJ SOLN
4.0000 mg | Freq: Once | INTRAMUSCULAR | Status: AC
  Administered 2022-03-18: 4 mg via INTRAVENOUS
  Filled 2022-03-18: qty 2

## 2022-03-18 NOTE — ED Triage Notes (Signed)
Pt started with vomiting last night and today she had a syncopal episode  pt is now c/o chest pain that started on the way here   Pt states the chest pain feels like tightness and it comes when she feels like she has to vomit

## 2022-03-19 ENCOUNTER — Encounter: Payer: Self-pay | Admitting: Family Medicine

## 2022-03-19 ENCOUNTER — Ambulatory Visit (INDEPENDENT_AMBULATORY_CARE_PROVIDER_SITE_OTHER): Payer: BC Managed Care – PPO | Admitting: Family Medicine

## 2022-03-19 VITALS — BP 120/80 | HR 103 | Temp 97.9°F | Ht 64.0 in | Wt 164.4 lb

## 2022-03-19 DIAGNOSIS — E1165 Type 2 diabetes mellitus with hyperglycemia: Secondary | ICD-10-CM

## 2022-03-19 LAB — URINALYSIS, MICROSCOPIC (REFLEX)

## 2022-03-19 LAB — URINALYSIS, ROUTINE W REFLEX MICROSCOPIC
Bilirubin Urine: NEGATIVE
Glucose, UA: NEGATIVE mg/dL
Ketones, ur: 15 mg/dL — AB
Nitrite: NEGATIVE
Protein, ur: NEGATIVE mg/dL
Specific Gravity, Urine: 1.01 (ref 1.005–1.030)
pH: 7 (ref 5.0–8.0)

## 2022-03-19 LAB — TROPONIN I (HIGH SENSITIVITY)
Troponin I (High Sensitivity): 4 ng/L (ref <18)
Troponin I (High Sensitivity): 4 ng/L (ref <18)

## 2022-03-19 MED ORDER — GLYBURIDE 5 MG PO TABS
5.0000 mg | ORAL_TABLET | Freq: Every day | ORAL | 1 refills | Status: DC
Start: 2022-03-19 — End: 2022-05-16

## 2022-03-19 MED ORDER — ONDANSETRON 8 MG PO TBDP: ORAL_TABLET | ORAL | 0 refills | Status: DC

## 2022-03-19 MED ORDER — TIRZEPATIDE 2.5 MG/0.5ML ~~LOC~~ SOAJ: 2.5000 mg | SUBCUTANEOUS | 1 refills | Status: DC

## 2022-03-19 NOTE — Patient Instructions (Addendum)
Push fluids.  Continue to monitor your sugars at home.  We are going on a lower dosage of Mounjaro.  Hold the Actos and go down to 1 tab of the glyburide daily.  Let us know if you need anything.

## 2022-03-19 NOTE — Progress Notes (Signed)
Chief Complaint  Patient presents with   Allergic Rhinitis    Fatigue    Hot/and cold  Nausea exhaustion    Subjective: Patient is a 56 y.o. female here for malaise.  4 days ago, the patient took her first injection of Mounjaro 5 mg.  Since that time, she has had nausea, decreased oral intake, vomiting, heat/cold intolerance, and fatigue.  Every time she eats something, she will throw it up.  She has not been hungry.  Her sugars were running in the high 200s prior to starting the medication.  Since that time, they have been running in the mid-high 100s.  She is not having any chest pain, shortness of breath, palpitation, fevers, diarrhea, or bleeding.  Past Medical History:  Diagnosis Date   Diabetes mellitus without complication (HCC)    Hypertension    NAFLD (nonalcoholic fatty liver disease)    Thyroid disease     Objective: BP 120/80   Pulse (!) 103   Temp 97.9 F (36.6 C) (Oral)   Ht 5\' 4"  (1.626 m)   Wt 164 lb 6 oz (74.6 kg)   SpO2 98%   BMI 28.21 kg/m  General: Awake, appears stated age Heart: Tachycardic, regular rhythm, no bruits, no LE edema Lungs: CTAB, no rales, wheezes or rhonchi. No accessory muscle use Abdomen: Bowel sounds present, soft, nontender, nondistended Neuro: DTRs equal and symmetric throughout, no clonus, no cerebellar signs, gait is slow/cautious Psych: Age appropriate judgment and insight, normal affect and mood  Assessment and Plan: Type 2 diabetes mellitus with hyperglycemia, without long-term current use of insulin (HCC) - Plan: tirzepatide (MOUNJARO) 2.5 MG/0.5ML Pen  Adverse effect of chronic medication.  I think she is having adverse effects of the Mounjaro in addition to going hypoglycemic.  Stop Actos temporarily.  Decrease glyburide from 5 mg twice daily to once daily.  We will decrease the dosage of Mounjaro from 5 mg/week to 2.5 mg/week.  Continue to monitor sugars.  I would like to see her in 2 weeks. The patient voiced understanding  and agreement to the plan.  [place], DO 03/19/22  4:36 PM

## 2022-03-19 NOTE — ED Provider Notes (Signed)
[facility] EMERGENCY DEPARTMENT Provider Note   CSN: [id number] Arrival date & time: 03/18/22  2056     History  Chief Complaint  Patient presents with   Loss of Consciousness    Lindsey Owen is a 56 y.o. female.  The history is provided by the patient.  Emesis Severity:  Mild Duration:  1 day Timing:  Rare Number of daily episodes:  2 Quality:  Stomach contents Able to tolerate:  Liquids Progression:  Resolved Chronicity:  New Recent urination:  Normal Context: not post-tussive   Relieved by:  Nothing Worsened by:  Nothing Ineffective treatments:  None tried Associated symptoms: no abdominal pain, no fever, no myalgias, no sore throat and no URI   Associated symptoms comment:  Feels tight in her chest when she needs to vomit and nearly passed out in the car on the way here.  Risk factors: no alcohol use       Home Medications Prior to Admission medications   Medication Sig Start Date End Date Taking? Authorizing Provider  ondansetron (ZOFRAN-ODT) 8 MG disintegrating tablet 8mg  ODT q4 hours prn nausea 03/19/22  Yes Samyria Rudie, MD  amitriptyline (ELAVIL) 25 MG tablet Take 1 tablet (25 mg total) by mouth at bedtime. Patient taking differently: Take 25 mg by mouth at bedtime as needed. 10/04/21   Shelda Pal, DO  budesonide (RHINOCORT ALLERGY) 32 MCG/ACT nasal spray Place 2 sprays into both nostrils daily. 03/07/22   Shelda Pal, DO  Calcium Carbonate-Vit D-Min (CALCIUM 1200 PO) Take 1 tablet by mouth daily.    [provider]  clobetasol (TEMOVATE) 0.05 % external solution Apply 1 application topically 2 (two) times daily. 03/07/22   Shelda Pal, DO  glyBURIDE (DIABETA) 5 MG tablet Take 1 tablet (5 mg total) by mouth 2 (two) times daily with a meal. 11/24/21   Wendling, Crosby Oyster, DO  ketotifen (ZADITOR) 0.025 % ophthalmic solution Place 1 drop into both eyes 2 (two) times daily. 02/21/22   Shelda Pal, DO  levothyroxine (EUTHYROX) 125 MCG tablet Take 1 tablet (125 mcg total) by mouth daily before breakfast. 10/04/21   Wendling, Crosby Oyster, DO  lisinopril-hydrochlorothiazide (ZESTORETIC) 20-12.5 MG tablet Take 1 tablet by mouth daily. 10/04/21   Shelda Pal, DO  metFORMIN (GLUCOPHAGE-XR) 500 MG 24 hr tablet Take 2 tablets (1,000 mg total) by mouth in the morning and at bedtime. Take 1 tablet 3 times a day with a meal (can also take 2 tablets with breakfast and 1 tablet with evening meal) 02/01/22   Wendling, Crosby Oyster, DO  methocarbamol (ROBAXIN) 500 MG tablet Take 1 tablet (500 mg total) by mouth every 8 (eight) hours as needed for muscle spasms. 02/21/22   Shelda Pal, DO  naproxen (NAPROSYN) 500 MG tablet Take 1 tablet (500 mg total) by mouth 2 (two) times daily with a meal. 02/21/22   Wendling, Crosby Oyster, DO  Omega-3 Fatty Acids (FISH OIL) 1000 MG CAPS Take by mouth.    [provider]  pioglitazone (ACTOS) 30 MG tablet Take 1 tablet (30 mg total) by mouth daily. 02/01/22   Shelda Pal, DO  pregabalin (LYRICA) 75 MG capsule Take 1 capsule (75 mg total) by mouth at bedtime. 01/31/22 07/30/22  Shelda Pal, DO  rosuvastatin (CRESTOR) 5 MG tablet Take 1 tablet (5 mg total) by mouth daily. 11/19/21   Shelda Pal, DO  tirzepatide [facility]) 5 MG/0.5ML Pen Inject 5 mg into the skin once  a week. 03/07/22   Shelda Pal, DO  triamcinolone cream (KENALOG) 0.1 % Apply 1 application. topically 2 (two) times daily. 03/07/22   Shelda Pal, DO      Allergies    Patient has no known allergies.    Review of Systems   Review of Systems  Constitutional:  Negative for fever.  HENT:  Negative for congestion, drooling, facial swelling and sore throat.   Eyes:  Negative for redness.  Respiratory:  Negative for wheezing and stridor.   Cardiovascular:  Positive for syncope. Negative for palpitations and leg  swelling.  Gastrointestinal:  Positive for vomiting. Negative for abdominal pain.  Genitourinary:  Negative for dysuria.  Musculoskeletal:  Negative for myalgias.  Neurological:  Negative for facial asymmetry.  All other systems reviewed and are negative.  Physical Exam Updated Vital Signs BP (!) 122/92   Pulse 70   Temp 98.5 F (36.9 C) (Oral)   Resp 18   Ht 5\' 3"  (1.6 m)   Wt 77.1 kg   SpO2 95%   BMI 30.11 kg/m  Physical Exam Vitals and nursing note reviewed.  Constitutional:      General: She is not in acute distress.    Appearance: She is well-developed.  HENT:     Head: Normocephalic and atraumatic.     Nose: Nose normal.     Mouth/Throat:     Mouth: Mucous membranes are moist.  Eyes:     Conjunctiva/sclera: Conjunctivae normal.     Pupils: Pupils are equal, round, and reactive to light.     Comments: Normal appearance  Cardiovascular:     Rate and Rhythm: Normal rate and regular rhythm.     Pulses: Normal pulses.     Heart sounds: Normal heart sounds.  Pulmonary:     Effort: Pulmonary effort is normal. No respiratory distress.     Breath sounds: Normal breath sounds.  Abdominal:     General: Bowel sounds are normal. There is no distension.     Palpations: Abdomen is soft. There is no mass.     Tenderness: There is no abdominal tenderness. There is no guarding or rebound.  Genitourinary:    Comments: No CVA tenderness Musculoskeletal:        General: Normal range of motion.     Cervical back: Normal range of motion.  Skin:    General: Skin is warm and dry.     Capillary Refill: Capillary refill takes less than 2 seconds.     Findings: No rash.  Neurological:     Mental Status: She is alert and oriented to person, place, and time.     Deep Tendon Reflexes: Reflexes normal.  Psychiatric:        Mood and Affect: Mood normal.        Behavior: Behavior normal.    ED Results / Procedures / Treatments   Labs (all labs ordered are listed, but only abnormal  results are displayed) Results for orders placed or performed during the hospital encounter of [id number]  Basic metabolic panel  Result Value Ref Range   Sodium 135 135 - 145 mmol/L   Potassium 4.2 3.5 - 5.1 mmol/L   Chloride 93 (L) 98 - 111 mmol/L   CO2 31 22 - 32 mmol/L   Glucose, Bld 167 (H) 70 - 99 mg/dL   BUN 11 6 - 20 mg/dL   Creatinine, Ser 0.75 0.44 - 1.00 mg/dL   Calcium 9.7 8.9 - 10.3 mg/dL   GFR,  Estimated >60 >60 mL/min   Anion gap 11 5 - 15  CBC  Result Value Ref Range   WBC 8.7 4.0 - 10.5 K/uL   RBC 5.56 (H) 3.87 - 5.11 MIL/uL   Hemoglobin 16.1 (H) 12.0 - 15.0 g/dL   HCT 48.0 (H) 36.0 - 46.0 %   MCV 86.3 80.0 - 100.0 fL   MCH 29.0 26.0 - 34.0 pg   MCHC 33.5 30.0 - 36.0 g/dL   RDW 12.3 11.5 - 15.5 %   Platelets 221 150 - 400 K/uL   nRBC 0.0 0.0 - 0.2 %  Urinalysis, Routine w reflex microscopic Urine, Clean Catch  Result Value Ref Range   Color, Urine YELLOW YELLOW   APPearance CLEAR CLEAR   Specific Gravity, Urine 1.010 1.005 - 1.030   pH 7.0 5.0 - 8.0   Glucose, UA NEGATIVE NEGATIVE mg/dL   Hgb urine dipstick TRACE (A) NEGATIVE   Bilirubin Urine NEGATIVE NEGATIVE   Ketones, ur 15 (A) NEGATIVE mg/dL   Protein, ur NEGATIVE NEGATIVE mg/dL   Nitrite NEGATIVE NEGATIVE   Leukocytes,Ua TRACE (A) NEGATIVE  Urinalysis, Microscopic (reflex)  Result Value Ref Range   RBC / HPF 0-5 0 - 5 RBC/hpf   WBC, UA 6-10 0 - 5 WBC/hpf   Bacteria, UA FEW (A) NONE SEEN   Squamous Epithelial / LPF 6-10 0 - 5   Non Squamous Epithelial PRESENT (A) NONE SEEN   Mucus PRESENT   CBG monitoring, ED  Result Value Ref Range   Glucose-Capillary 193 (H) 70 - 99 mg/dL   Comment 1 Notify RN   Troponin I (High Sensitivity)  Result Value Ref Range   Troponin I (High Sensitivity) 4 <18 ng/L  Troponin I (High Sensitivity)  Result Value Ref Range   Troponin I (High Sensitivity) 4 <18 ng/L   DG Chest Portable 1 View  Result Date: 03/18/2022 CLINICAL DATA:  Pain. EXAM: PORTABLE CHEST 1  VIEW COMPARISON:  Chest x-ray 08/24/2015 FINDINGS: The heart size and mediastinal contours are within normal limits. Both lungs are clear. Cervical spinal fusion plate is present. No acute fractures are seen. IMPRESSION: No active disease. Electronically Signed   By: Ronney Asters M.D.   On: 03/18/2022 23:37      EKG EKG Interpretation  Date/Time:  Tuesday Mar 18 2022 21:14:44 EDT Ventricular Rate:  88 PR Interval:  160 QRS Duration: 90 QT Interval:  332 QTC Calculation: 401 R Axis:   24 Text Interpretation: Normal sinus rhythm Possible Anterior infarct , age undetermined T wave abnormality, consider inferior ischemia Abnormal ECG No previous ECGs available no prior ECG for comparison. No STEMI Confirmed by Antony Blackbird [phone]) on 03/18/2022 10:28:12 PM  Radiology DG Chest Portable 1 View  Result Date: 03/18/2022 CLINICAL DATA:  Pain. EXAM: PORTABLE CHEST 1 VIEW COMPARISON:  Chest x-ray 08/24/2015 FINDINGS: The heart size and mediastinal contours are within normal limits. Both lungs are clear. Cervical spinal fusion plate is present. No acute fractures are seen. IMPRESSION: No active disease. Electronically Signed   By: Ronney Asters M.D.   On: 03/18/2022 23:37    Procedures Procedures    Medications Ordered in ED Medications  ondansetron [facility]) injection 4 mg (4 mg Intravenous Given 03/18/22 2339)  sodium chloride 0.9 % bolus 500 mL (500 mLs Intravenous Bolus 03/18/22 2345)    ED Course/ Medical Decision Making/ A&P  Medical Decision Making Patient with emesis and tightness with the emesis.  Near syncope in the car on the way here   Amount and/or Complexity of Data Reviewed Independent Historian:     Details: family member, see above External Data Reviewed: notes.    Details: previous notes Labs: ordered.    Details: all labs reviewed:  2 negative troponins. no change in troponin level.  Urine is contaminated but not consistent with UTI.  Normal  white count.  8.7 and platelet count 221K.  Normal sodium and potassium 4.2 and normal BUN 11 and creatinine.  .75 glucose slightly elevated 167. Radiology: ordered and independent interpretation performed.    Details: normal CXR ECG/medicine tests: ordered and independent interpretation performed.    Details: this is not ischemia inferiorly.  difficus ST T changes.  No ischemia  Risk Prescription drug management. Risk Details: Exam and vitals and testing are benign and reassuring.  No further emesis in the ED.  Ruled out for MI in the ED.  I suspect the vomiting is viral in nature.  Patient was in the car, did not hit head.  Patient is very well appearing and stable for discharge with close follow up.     Final Clinical Impression(s) / ED Diagnoses Final diagnoses:  Nausea and vomiting, unspecified vomiting type     Return for intractable cough, coughing up blood, fevers > 100.4 unrelieved by medication, shortness of breath, intractable vomiting, chest pain, shortness of breath, weakness, numbness, changes in speech, facial asymmetry, abdominal pain, passing out, Inability to tolerate liquids or food, cough, altered mental status or any concerns. No signs of systemic illness or infection. The patient is nontoxic-appearing on exam and vital signs are within normal limits.  I have reviewed the triage vital signs and the nursing notes. Pertinent labs & imaging results that were available during my care of the patient were reviewed by me and considered in my medical decision making (see chart for details). After history, exam, and medical workup I feel the patient has been appropriately medically screened and is safe for discharge home. Pertinent diagnoses were discussed with the patient. Patient was given return precautions. Rx / DC Orders ED Discharge Orders          Ordered    ondansetron (ZOFRAN-ODT) 8 MG disintegrating tablet        03/19/22 0228              Matsuko Kretz,  MD 03/19/22 [id number]

## 2022-03-24 DIAGNOSIS — M9903 Segmental and somatic dysfunction of lumbar region: Secondary | ICD-10-CM | POA: Diagnosis not present

## 2022-03-25 DIAGNOSIS — M9903 Segmental and somatic dysfunction of lumbar region: Secondary | ICD-10-CM | POA: Diagnosis not present

## 2022-03-26 DIAGNOSIS — M9903 Segmental and somatic dysfunction of lumbar region: Secondary | ICD-10-CM | POA: Diagnosis not present

## 2022-04-01 DIAGNOSIS — M9903 Segmental and somatic dysfunction of lumbar region: Secondary | ICD-10-CM | POA: Diagnosis not present

## 2022-04-07 DIAGNOSIS — M9903 Segmental and somatic dysfunction of lumbar region: Secondary | ICD-10-CM | POA: Diagnosis not present

## 2022-04-08 DIAGNOSIS — M9903 Segmental and somatic dysfunction of lumbar region: Secondary | ICD-10-CM | POA: Diagnosis not present

## 2022-04-17 ENCOUNTER — Other Ambulatory Visit: Payer: Self-pay | Admitting: Family Medicine

## 2022-04-17 DIAGNOSIS — E1165 Type 2 diabetes mellitus with hyperglycemia: Secondary | ICD-10-CM

## 2022-04-17 MED ORDER — TIRZEPATIDE 2.5 MG/0.5ML ~~LOC~~ SOAJ: 2.5000 mg | SUBCUTANEOUS | 1 refills | Status: DC

## 2022-04-30 DIAGNOSIS — M9903 Segmental and somatic dysfunction of lumbar region: Secondary | ICD-10-CM | POA: Diagnosis not present

## 2022-05-02 ENCOUNTER — Encounter: Payer: Self-pay | Admitting: Family Medicine

## 2022-05-02 ENCOUNTER — Ambulatory Visit (INDEPENDENT_AMBULATORY_CARE_PROVIDER_SITE_OTHER): Payer: BC Managed Care – PPO | Admitting: Family Medicine

## 2022-05-02 VITALS — BP 110/78 | HR 88 | Temp 98.2°F | Ht 63.0 in | Wt 165.5 lb

## 2022-05-02 DIAGNOSIS — M7631 Iliotibial band syndrome, right leg: Secondary | ICD-10-CM

## 2022-05-02 DIAGNOSIS — S76011A Strain of muscle, fascia and tendon of right hip, initial encounter: Secondary | ICD-10-CM

## 2022-05-02 DIAGNOSIS — E669 Obesity, unspecified: Secondary | ICD-10-CM | POA: Diagnosis not present

## 2022-05-02 DIAGNOSIS — E1169 Type 2 diabetes mellitus with other specified complication: Secondary | ICD-10-CM

## 2022-05-02 MED ORDER — METFORMIN HCL ER 500 MG PO TB24: 1000.0000 mg | ORAL_TABLET | Freq: Two times a day (BID) | ORAL | 1 refills | Status: DC

## 2022-05-02 NOTE — Progress Notes (Signed)
Musculoskeletal Exam  Patient: Lindsey Owen DOB: 11/09/1965  DOS: 05/02/2022  SUBJECTIVE:  Chief Complaint:   Chief Complaint  Patient presents with   Hip Pain   Knee Pain    right    Lindsey Owen is a 56 y.o.  female for evaluation and treatment of R knee and hip pain.   Onset:  several months ago. Standing on her feet.  Location: outer R hip and lateral knee Character:  aching and sharp  Progression of issue:  has worsened Associated symptoms: radiating down RLE Treatment: to date has been chiropractic.   Neurovascular symptoms: no  Past Medical History:  Diagnosis Date   Diabetes mellitus without complication (HCC)    Hypertension    NAFLD (nonalcoholic fatty liver disease)    Thyroid disease     Objective: VITAL SIGNS: BP 110/78   Pulse 88   Temp 98.2 F (36.8 C) (Oral)   Ht 5\' 3"  (1.6 m)   Wt 165 lb 8 oz (75.1 kg)   SpO2 98%   BMI 29.32 kg/m  Constitutional: Well formed, well developed. No acute distress. Thorax & Lungs: No accessory muscle use Musculoskeletal: R hip/knee.   Normal active range of motion: yes.   Normal passive range of motion: yes Tenderness to palpation: Yes over the IT band, gluteus medius, and mild tenderness over the lateral right leg compartment Deformity: no Ecchymosis: no Tests positive: none Tests negative: Logroll, Stinchfield, Ober's Neurologic: Normal sensory function. No focal deficits noted.  Gait is antalgic. Psychiatric: Normal mood. Age appropriate judgment and insight. Alert & oriented x 3.    Assessment:  Strain of gluteus medius of right lower extremity, initial encounter - Plan: Ambulatory referral to Physical Therapy  Iliotibial band syndrome of right side - Plan: Ambulatory referral to Physical Therapy  Diabetes mellitus type 2 in obese (HCC) - Plan: metFORMIN (GLUCOPHAGE-XR) 500 MG 24 hr tablet  Plan: Stretches/exercises, heat, ice, Tylenol.  Refer to physical therapy.  Will consider Ortho referral if no  improvement. F/u as originally scheduled. The patient voiced understanding and agreement to the plan.   Kennard, DO 05/02/22  4:46 PM

## 2022-05-02 NOTE — Patient Instructions (Signed)
Ice/cold pack over area for 10-15 min twice daily.  Heat (pad or rice pillow in microwave) over affected area, 10-15 minutes twice daily.   OK to take Tylenol 1000 mg (2 extra strength tabs) or 975 mg (3 regular strength tabs) every 6 hours as needed.  If you do not hear anything about your referral in the next 1-2 weeks, call our office and ask for an update.  Let us know if you need anything.  Gluteus Medius Syndrome Rehab It is normal to feel mild stretching, pulling, tightness, or discomfort as you do these exercises, but you should stop right away if you feel sudden pain or your pain gets worse.   Stretching and range of motion exercise This exercise warms up your muscles and joints and improves the movement and flexibility of your hip and pelvis. This exercise also helps to relieve pain and stiffness. Exercise A: Lunge (hip flexor stretch)      Kneel on the floor on your left / right knee. Bend your other knee so it is directly over your ankle. Keep good posture with your head over your shoulders. Tuck your tailbone underneath you. This will prevent your back from arching too much. You should feel a gentle stretch in the front of your thigh or hip. If you do not feel a stretch, slowly lunge forward with your chest up. Hold this position for 30 seconds. Slowly return to the starting position. Repeat 2 times. Complete this exercise 3 times per week. Strengthening exercises These exercises build strength and endurance in your hip and pelvis. Endurance is the ability to use your muscles for a long time, even after they get tired. Exercise B: Bridge (hip extensors)     Lie on your back on a firm surface with your knees bent and your feet flat on the floor. Tighten your buttocks muscles and lift your bottom off the floor until the trunk of your body is level with your thighs. You should feel the muscles working in your buttocks and the back of your thighs. If this exercise is too easy,  cross your arms over your chest or lift one leg while your bottom is up off the floor. Do not arch your back. Hold this position for 3 seconds. Slowly lower your hips to the starting position. Let your muscles relax completely between repetitions. Repeat 2 times. Complete this exercise 3 times per week. Exercise C: Straight leg raises (hip abductors)     Lie on your side with your left / right leg in the top position. Lie so your head, shoulder, knee, and hip line up. Bend your bottom knee to help you balance. Lift your top leg up 4-6 inches (10-15 cm), keeping your toes pointed straight ahead. Hold this position for 2 seconds. Slowly lower your leg to the starting position and let your muscles relax completely. Repeat for a total of 10 repetitions. Repeat 2 times. Complete this exercise 3 times per week. Exercise D: Hip abductors and external rotators, quadruped Get on your hands and knees on a firm, lightly padded surface. Your hands should be directly below your shoulders, and your knees should be directly below your hips. Lift your left / right knee out to the side. Keep your knee bent. Do not twist your body. Hold this position for 3 seconds. Slowly lower your leg. Repeat for a total of 10 repetitions.  Repeat 2 times. Complete this exercise 3 times per week. Exercise E: Single leg stand Stand near a  counter or door frame to hold onto as needed. It is helpful to look in a mirror for this exercise so you can watch your hip. Squeeze your left / right buttock muscles then lift up your other foot. Do not let your left / right hip push out to the side. Hold this position for 3 seconds. Repeat for a total of 10 repetitions. Repeat 2 times. Complete this exercise 3 times per week. Make sure you discuss any questions you have with your health care provider. Document Released: 10/06/2005 Document Revised: 06/12/2016 Document Reviewed: 09/18/2015 Elsevier Interactive Patient Education  2018  [place] Band Syndrome Rehab It is normal to feel mild stretching, pulling, tightness, or discomfort as you do these exercises, but you should stop right away if you feel sudden pain or your pain gets worse.  Stretching and range of motion exercises These exercises warm up your muscles and joints and improve the movement and flexibility of your hip and pelvis. Exercise A: Quadriceps, prone    Lie on your abdomen on a firm surface, such as a bed or padded floor. Bend your left / right knee and hold your ankle. If you cannot reach your ankle or pant leg, loop a belt around your foot and grab the belt instead. Gently pull your heel toward your buttocks. Your knee should not slide out to the side. You should feel a stretch in the front of your thigh and knee. Hold this position for 30 seconds. Repeat 2 times. Complete this stretch 3 times per week. Exercise B: Iliotibial band    Lie on your side with your left / right leg in the top position. Bend both of your knees and grab your left / right ankle. Stretch out your bottom arm to help you balance. Slowly bring your top knee back so your thigh goes behind your trunk. Slowly lower your top leg toward the floor until you feel a gentle stretch on the outside of your left / right hip and thigh. If you do not feel a stretch and your knee will not fall farther, place the heel of your other foot on top of your knee and pull your knee down toward the floor with your foot. Hold this position for 30 seconds. Repeat 2 times. Complete this stretch 3 times per week. Strengthening exercises These exercises build strength and endurance in your hip and pelvis. Endurance is the ability to use your muscles for a long time, even after they get tired. Exercise C: Straight leg raises (hip abductors)     Lie on your side with your left / right leg in the top position. Lie so your head, shoulder, knee, and hip line up. You may bend your bottom knee  to help you balance. Roll your hips slightly forward so your hips are stacked directly over each other and your left / right knee is facing forward. Tense the muscles in your outer thigh and lift your top leg 4-6 inches (10-15 cm). Hold this position for 3 seconds. Repeat for a total of 10 reps. Slowly return to the starting position. Let your muscles relax completely before doing another repetition. Repeat 2 times. Complete this exercise 3 times per week. Exercise D: Straight leg raises (hip extensors) Lie on your abdomen on your bed or a firm surface. You can put a pillow under your hips if that is more comfortable. Bend your left / right knee so your foot is straight up in the air. Squeeze your  buttock muscles and lift your left / right thigh off the bed. Do not let your back arch. Tense this muscle as hard as you can without increasing any knee pain. Hold this position for 2 seconds. Repeat for a total of 10 reps Slowly lower your leg to the starting position and allow it to relax completely. Repeat 2 times. Complete this exercise 3 times per week. Exercise E: Hip hike Stand sideways on a bottom step. Stand on your left / right leg with your other foot unsupported next to the step. You can hold onto the railing or wall if needed for balance. Keep your knees straight and your torso square. Then, lift your left / right hip up toward the ceiling. Slowly let your left / right hip lower toward the floor, past the starting position. Your foot should get closer to the floor. Do not lean or bend your knees. Repeat 2 times. Complete this exercise 3 times per week.  Document Released: 10/06/2005 Document Revised: 06/10/2016 Document Reviewed: 09/07/2015 Elsevier Interactive Patient Education  [employer].

## 2022-05-13 ENCOUNTER — Other Ambulatory Visit: Payer: Self-pay | Admitting: Family Medicine

## 2022-05-13 DIAGNOSIS — M545 Low back pain, unspecified: Secondary | ICD-10-CM

## 2022-05-14 ENCOUNTER — Other Ambulatory Visit: Payer: Self-pay | Admitting: Family Medicine

## 2022-05-14 DIAGNOSIS — E1169 Type 2 diabetes mellitus with other specified complication: Secondary | ICD-10-CM

## 2022-05-14 MED ORDER — PREGABALIN 75 MG PO CAPS: 75.0000 mg | ORAL_CAPSULE | Freq: Every evening | ORAL | 1 refills | Status: DC

## 2022-05-14 NOTE — Addendum Note (Signed)
Addended byConrad Seminole D on: 05/14/2022 09:11 AM   Modules accepted: Orders

## 2022-05-14 NOTE — Telephone Encounter (Signed)
Requesting: Lyrica 75mg   Contract: None UDS: None Last Visit: 05/02/22 Next Visit: None Last Refill: 01/31/22 #90 and 1RF   Pt using new pharmacy  Please Advise

## 2022-05-16 ENCOUNTER — Other Ambulatory Visit: Payer: Self-pay | Admitting: Family Medicine

## 2022-05-16 MED ORDER — OZEMPIC (0.25 OR 0.5 MG/DOSE) 2 MG/1.5ML ~~LOC~~ SOPN: PEN_INJECTOR | SUBCUTANEOUS | 1 refills | Status: DC

## 2022-05-19 ENCOUNTER — Other Ambulatory Visit: Payer: Self-pay | Admitting: Family Medicine

## 2022-05-19 MED ORDER — TRULICITY 0.75 MG/0.5ML ~~LOC~~ SOAJ: 0.7500 mg | SUBCUTANEOUS | 2 refills | Status: DC

## 2022-05-23 ENCOUNTER — Ambulatory Visit: Payer: BC Managed Care – PPO

## 2022-06-06 ENCOUNTER — Ambulatory Visit: Payer: BC Managed Care – PPO | Admitting: Physical Medicine & Rehabilitation

## 2022-06-10 ENCOUNTER — Other Ambulatory Visit: Payer: Self-pay | Admitting: Family Medicine

## 2022-06-10 DIAGNOSIS — G8929 Other chronic pain: Secondary | ICD-10-CM

## 2022-06-16 DIAGNOSIS — M9903 Segmental and somatic dysfunction of lumbar region: Secondary | ICD-10-CM | POA: Diagnosis not present

## 2022-06-18 DIAGNOSIS — M9903 Segmental and somatic dysfunction of lumbar region: Secondary | ICD-10-CM | POA: Diagnosis not present

## 2022-06-30 ENCOUNTER — Telehealth: Payer: Self-pay | Admitting: Family Medicine

## 2022-06-30 NOTE — Telephone Encounter (Signed)
Updated.and patient informed

## 2022-06-30 NOTE — Telephone Encounter (Signed)
Patient called to request her pharmacy be changed to [facility] on  [address] [place], [place], [place] [address]; Phone # [phone]

## 2022-07-04 MED ORDER — TIZANIDINE HCL 4 MG PO TABS: 4.0000 mg | ORAL_TABLET | Freq: Four times a day (QID) | ORAL | 1 refills | Status: DC | PRN

## 2022-07-04 NOTE — Addendum Note (Signed)
Addended by: Scharlene Gloss B on: 07/04/2022 04:58 PM   Modules accepted: Orders

## 2022-07-11 ENCOUNTER — Ambulatory Visit (INDEPENDENT_AMBULATORY_CARE_PROVIDER_SITE_OTHER): Payer: BC Managed Care – PPO | Admitting: Family Medicine

## 2022-07-11 ENCOUNTER — Encounter: Payer: Self-pay | Admitting: Family Medicine

## 2022-07-11 VITALS — BP 118/60 | HR 72 | Temp 97.9°F | Resp 18 | Ht 63.0 in | Wt 169.6 lb

## 2022-07-11 DIAGNOSIS — E669 Obesity, unspecified: Secondary | ICD-10-CM | POA: Diagnosis not present

## 2022-07-11 DIAGNOSIS — G8929 Other chronic pain: Secondary | ICD-10-CM | POA: Diagnosis not present

## 2022-07-11 DIAGNOSIS — E1169 Type 2 diabetes mellitus with other specified complication: Secondary | ICD-10-CM

## 2022-07-11 DIAGNOSIS — M545 Low back pain, unspecified: Secondary | ICD-10-CM | POA: Diagnosis not present

## 2022-07-11 MED ORDER — TRULICITY 1.5 MG/0.5ML ~~LOC~~ SOAJ: 1.5000 mg | SUBCUTANEOUS | 2 refills | Status: DC

## 2022-07-11 MED ORDER — METHYLPREDNISOLONE ACETATE 80 MG/ML IJ SUSP
80.0000 mg | Freq: Once | INTRAMUSCULAR | Status: AC
  Administered 2022-07-11: 80 mg via INTRAMUSCULAR

## 2022-07-11 NOTE — Patient Instructions (Addendum)
Heat (pad or rice pillow in microwave) over affected area, 10-15 minutes twice daily.   Ice/cold pack over area for 10-15 min twice daily.  OK to take Tylenol 1000 mg (2 extra strength tabs) or 975 mg (3 regular strength tabs) every 6 hours as needed.  Get back on track with your stretches.  Keep the diet clean and stay active.  Call Center for [place] at [facility] at [phone] for an appointment.  They are located at [address], [place], [place], [address] (right across the hall from our office).  Let us know if you need anything.

## 2022-07-11 NOTE — Progress Notes (Signed)
Subjective:   Chief Complaint  Patient presents with   discuss blood work (traveling), back injection    Leaving Sunday.     Lindsey Owen is a 56 y.o. female here for follow-up of diabetes.   Lindsey Owen's self monitored glucose range is 100-300's.  Patient denies hypoglycemic reactions. Patient does not require insulin.   Medications include: glyburide 5 mg bid, Metformin XR 7412 mg bid, Trulicity 8.78 mg/week Diet is healthier.  Exercise: walking  Patient has a chronic history of right lower back pain with radiation down the right lower extremity.  No recent injury or change in activity but has been getting worse.  She has had a Depo-Medrol injection in her gluteus before which did help.  She is going to [place] for 7 weeks and 2 days.  She will be doing lots of walking and would like another injection.  No bowel/bladder incontinence.  No weakness, numbness, tingling, bruising, swelling, or redness.  Past Medical History:  Diagnosis Date   Diabetes mellitus without complication (HCC)    Hypertension    NAFLD (nonalcoholic fatty liver disease)    Thyroid disease      Related testing: Retinal exam: Done Pneumovax: done  Objective:  BP 118/60 (BP Location: Left Arm, Patient Position: Sitting, Cuff Size: Normal)   Pulse 72   Temp 97.9 F (36.6 C) (Temporal)   Resp 18   Ht 5\' 3"  (1.6 m)   Wt 169 lb 9.6 oz (76.9 kg)   SpO2 97%   BMI 30.04 kg/m  General:  Well developed, well nourished, in no apparent distress Skin:  Warm, no pallor or diaphoresis Lungs:  CTAB, no access msc use Cardio:  RRR, no bruits, no LE edema Musculoskeletal: TTP in the right lumbar paraspinal musculature region; poor hamstring flexibility bilaterally Neuro:  Sensation intact to pinprick on feet, negative straight leg bilaterally, 5/5 strength throughout, DTRs equal and symmetric in lower extremities bilaterally, no clonus, cerebellar signs Psych: Age appropriate judgment and insight  Assessment:    Diabetes mellitus type 2 in obese ([place]) - Plan: Dulaglutide (TRULICITY) 1.5 MV/6.7MC SOPN  Chronic right-sided low back pain without sciatica   Plan:   Chronic, unstable.  Continue metformin XR 1000 mg twice daily, glyburide 5 mg twice daily, increase Trulicity from 9.47 mg weekly up to 1.5 mg weekly.  Counseled on diet and exercise. Exacerbation of chronic issue.  Depo injection today.  Needs to get back on her stretches. F/u in 2 mo. The patient voiced understanding and agreement to the plan.  [place], DO 07/11/22 10:37 AM

## 2022-07-11 NOTE — Addendum Note (Signed)
Addended by: CREFT, Darlis Loan on: 07/11/2022 10:46 AM   Modules accepted: Orders

## 2022-09-01 ENCOUNTER — Telehealth: Payer: Self-pay | Admitting: Family Medicine

## 2022-09-01 ENCOUNTER — Ambulatory Visit (INDEPENDENT_AMBULATORY_CARE_PROVIDER_SITE_OTHER): Payer: BC Managed Care – PPO | Admitting: Family Medicine

## 2022-09-01 ENCOUNTER — Encounter: Payer: Self-pay | Admitting: Family Medicine

## 2022-09-01 ENCOUNTER — Other Ambulatory Visit: Payer: Self-pay | Admitting: Family Medicine

## 2022-09-01 ENCOUNTER — Ambulatory Visit (INDEPENDENT_AMBULATORY_CARE_PROVIDER_SITE_OTHER)
Admission: RE | Admit: 2022-09-01 | Discharge: 2022-09-01 | Disposition: A | Discharge status: Home / Self Care | Payer: BC Managed Care – PPO | Source: Ambulatory Visit | Attending: Family Medicine | Admitting: Family Medicine

## 2022-09-01 VITALS — BP 122/86 | HR 57 | Temp 98.4°F | Ht 63.0 in | Wt 168.5 lb

## 2022-09-01 DIAGNOSIS — E875 Hyperkalemia: Secondary | ICD-10-CM

## 2022-09-01 DIAGNOSIS — E039 Hypothyroidism, unspecified: Secondary | ICD-10-CM

## 2022-09-01 DIAGNOSIS — G8929 Other chronic pain: Secondary | ICD-10-CM

## 2022-09-01 DIAGNOSIS — M25561 Pain in right knee: Secondary | ICD-10-CM | POA: Diagnosis not present

## 2022-09-01 DIAGNOSIS — E669 Obesity, unspecified: Secondary | ICD-10-CM | POA: Diagnosis not present

## 2022-09-01 DIAGNOSIS — H6993 Unspecified Eustachian tube disorder, bilateral: Secondary | ICD-10-CM | POA: Diagnosis not present

## 2022-09-01 DIAGNOSIS — E1169 Type 2 diabetes mellitus with other specified complication: Secondary | ICD-10-CM

## 2022-09-01 LAB — LIPID PANEL
Cholesterol: 176 mg/dL (ref 0–200)
HDL: 53.9 mg/dL (ref >39.00)
LDL Cholesterol: 101 mg/dL — ABNORMAL HIGH (ref 0–99)
NonHDL: 122.06
Total CHOL/HDL Ratio: 3
Triglycerides: 106 mg/dL (ref 0.0–149.0)
VLDL: 21.2 mg/dL (ref 0.0–40.0)

## 2022-09-01 LAB — COMPREHENSIVE METABOLIC PANEL
ALT: 27 U/L (ref 0–35)
AST: 33 U/L (ref 0–37)
Albumin: 3.9 g/dL (ref 3.5–5.2)
Alkaline Phosphatase: 53 U/L (ref 39–117)
BUN: 17 mg/dL (ref 6–23)
CO2: 33 mEq/L — ABNORMAL HIGH (ref 19–32)
Calcium: 9.5 mg/dL (ref 8.4–10.5)
Chloride: 99 mEq/L (ref 96–112)
Creatinine, Ser: 0.77 mg/dL (ref 0.40–1.20)
GFR: 84.91 mL/min (ref >60.00)
Glucose, Bld: 107 mg/dL — ABNORMAL HIGH (ref 70–99)
Potassium: 5.2 mEq/L — ABNORMAL HIGH (ref 3.5–5.1)
Sodium: 136 mEq/L (ref 135–145)
Total Bilirubin: 0.8 mg/dL (ref 0.2–1.2)
Total Protein: 7.3 g/dL (ref 6.0–8.3)

## 2022-09-01 LAB — HEMOGLOBIN A1C: Hgb A1c MFr Bld: 7.3 % — ABNORMAL HIGH (ref 4.6–6.5)

## 2022-09-01 LAB — TSH: TSH: 4.45 u[IU]/mL (ref 0.35–5.50)

## 2022-09-01 LAB — T4, FREE: Free T4: 0.85 ng/dL (ref 0.60–1.60)

## 2022-09-01 MED ORDER — LEVOCETIRIZINE DIHYDROCHLORIDE 5 MG PO TABS: 5.0000 mg | ORAL_TABLET | Freq: Every evening | ORAL | 2 refills | Status: DC

## 2022-09-01 NOTE — Telephone Encounter (Signed)
Patient returned Robin's call regarding labs

## 2022-09-01 NOTE — Progress Notes (Signed)
Subjective:   Chief Complaint  Patient presents with   Follow-up    Ear fullness/echo Legs painful/heaviness Both knees painful   Medication Refill    On all medications     Lindsey Owen is a 56 y.o. female here for follow-up of diabetes.   Lindsey Owen's self monitored glucose range is low 100s's.  Patient denies hypoglycemic reactions. She checks her glucose levels several times per week. Patient does not require insulin.   Medications include: Trulicity 1.5 mg weekly, glyburide 5 mg twice daily, metformin XR 1000 mg twice daily Diet is Improving.  Exercise:  none  Patient has a several year history of right knee pain.  Is worse when she sits down for periods of time.  She has some swelling.  No decreased range of motion, bruising, or redness.  Has steadily worsened over the years.  No recent injury or change in activity.  Patient has fullness in both of her ears.  No decreased hearing.  She feels that there is something inside.  She went to [place] recently and the issue had resolved.  She has used Flonase without significant relief.  Past Medical History:  Diagnosis Date   Diabetes mellitus without complication (HCC)    Hypertension    NAFLD (nonalcoholic fatty liver disease)    Thyroid disease      Related testing: Retinal exam: Due Pneumovax: done  Objective:  BP 122/86 (BP Location: Left Arm, Patient Position: Sitting, Cuff Size: Normal)   Pulse (!) 57   Temp 98.4 F (36.9 C) (Oral)   Ht 5\' 3"  (1.6 m)   Wt 168 lb 8 oz (76.4 kg)   SpO2 98%   BMI 29.85 kg/m  General:  Well developed, well nourished, in no apparent distress Skin:  Warm, no pallor or diaphoresis Lungs:  CTAB, no access msc use Cardio: Regular rhythm, bradycardic, no bruits, no LE edema Psych: Age appropriate judgment and insight  Assessment:   Dysfunction of both eustachian tubes - Plan: Ambulatory referral to ENT, levocetirizine (XYZAL) 5 MG tablet  Chronic pain of right knee - Plan: DG Knee  Complete 4 Views Right  Diabetes mellitus type 2 in obese (HCC) - Plan: Ambulatory referral to Ophthalmology, Comprehensive metabolic panel, Lipid panel, Hemoglobin A1c  Hypothyroidism, unspecified type - Plan: TSH, T4, free   Plan:   Continue Flonase, add Xyzal.  Refer to ENT if this combination does not work. Check an x-ray of the knee as there was evidence of osteoarthritis noted of the 2016 knee radiograph.  Stretches and exercises provided.  Offered injection which she politely declined at this time.  Tylenol, ice, activity as tolerated. Chronic, hopefully stable.  Check labs.  She needs an eye exam.  Follow-up pending results. Continue levothyroxine 125 mcg daily. The patient voiced understanding and agreement to the plan.  2017 [place], DO 09/01/22 1:10 PM

## 2022-09-01 NOTE — Patient Instructions (Addendum)
Give [place] 2-3 business days to get the results of your labs back.   If you do not hear anything about your referral in the next 1-2 weeks, call our office and ask for an update.  Keep the diet clean and stay active.  Schedule your pap smear.   We will be in touch with your X-ray results.  Please get your X-ray done in the basement of our [place] office located on: [place], [place] [address]  Claritin (loratadine), Allegra (fexofenadine), Zyrtec (cetirizine) which is also equivalent to Xyzal (levocetirizine); these are listed in order from weakest to strongest. Generic, and therefore cheaper, options are in the parentheses.   Flonase (fluticasone); nasal spray that is over the counter. 2 sprays each nostril, once daily. Aim towards the same side eye when you spray.  There are available OTC, and the generic versions, which may be cheaper, are in parentheses. Show this to a pharmacist if you have trouble finding any of these items.  You do not need an appointment for that location.   Let us know if you need anything.  Stretching and range of motion exercises These exercises warm up your muscles and joints and improve the movement and flexibility of your knee. These exercises also help to relieve pain and stiffness.  Exercise A: Knee flexion, active Lie on your back with both knees straight. If this causes back discomfort, bend your uninjured knee so your foot is flat on the floor. Slowly slide your left / right heel back toward your buttocks until you feel a gentle stretch in the front of your knee or thigh. Stop if you have pain. Hold for3 seconds. Slowly slide your left / right heel back to the starting position. 10 total repetitions. Repeat 2 times. Complete this exercise 3 times a week.  Exercise B: Knee extension, sitting Sit with your left / right heel propped on a chair, a coffee table, or a footstool. Do not have anything under your knee to support it. Allow your leg  muscles to relax, letting gravity straighten out your knee. You should feel a stretch behind your left / right knee. If told by your health care provide just above your kneecap. Hold this position for 3 seconds. Repeat for a total of 10 repetitions. Repeat 2 times. Complete this stretch 3 times a week.  Strengthening exercises These exercises build strength and endurance in your knee. Endurance is the ability to use your muscles for a long time, even after they get tired.  Exercise C: Quadriceps, isometric Lie on your back with your left / right leg extended and your other knee bent. Put a rolled towel or small pillow under your right/left knee if told by your health care provider. Slowly tense the muscles in the front of your left / right thigh by pushing the back of your knee down. You should see your knee cap slide up toward your hip or see increased dimpling just above the knee. For 3 seconds, keep the muscle as tight as you can without increasing your pain. Relax the muscles slowly and completely. Repeat for 10 total repetitions. Repeat 2 times. Complete this exercise 3 times a week. Exercise D: Straight leg raises (quadriceps) Lie on your back with your left / right leg extended and your other knee bent. Tense the muscles in the front of your left / right thigh. You should see your kneecap slide up or see increased dimpling just above the knee. Keep these muscles tight  as you raise your leg 4-6 inches (10-15 cm) off the floor. Hold this position for 3 seconds. Keep these muscles tense as you lower your leg. Relax the muscles slowly and completely. Repeat for a total of 10 repetitions. Repeat 2 times. Complete this exercise 3 times a week.  Exercise E: Hamstring curls On the floor or a bed, lie on your abdomen with your legs straight. Put a folded towel or small pillow under your left / right thigh, just above your kneecap. Slowly bend your left / right knee as far as you can without  pain. Keep your hips flat against the floor or bed. Hold this position for 3 seconds. Slowly lower your leg to the starting position. Repeat for a total of 10 repetitions. Repeat 2 times. Complete this exercise 3 times per week.  Stretching exercises These exercises warm up your muscles and joints and improve the movement and flexibility of your knee. These exercises also help to relieve pain and stiffness.  Exercise A: Quadriceps, prone Lie on your abdomen on a firm surface, such as a bed or padded floor. Bend your left / right knee and hold your ankle. If you cannot reach your ankle or pant leg, loop a belt around your foot and grab the belt instead. Gently pull your heel toward your buttocks. Your knee should not slide out to the side. You should feel a stretch in the front of your thigh and knee. Hold this position for 30 seconds. Repeat 2 times. Complete this stretch 3 times a week.  Exercise B: Hamstring, doorway Lie on your back in front of a doorway with your left / right leg resting against the wall and your other leg flat on the floor in the doorway. There should be a slight bend in your left / right knee. Straighten your left / right knee. You should feel a stretch behind your knee or thigh. If you do not feel that stretch, scoot your buttocks closer to the door. Hold this position for 30 seconds. Repeat 2 times. Complete this stretch 3 times a week.  Strengthening exercises These exercises build strength and endurance in your knee and leg muscles. Endurance is the ability to use your muscles for a long time, even after they get tired.   Exercise D: Wall slides (quadriceps) Lean your back against a smooth wall or door, and walk your feet out 18-24 inches (45-61 cm) from it. Place your feet hip-width apart. Slowly slide down the wall or door until your knees bend 90 degrees. Keep your knees over your heels, not over your toes. Keep your knees in line with your hips. Hold for 2  seconds. Stand up to rest for 60 seconds. Repeat 2 times. Complete this exercise 3 times a week.  Exercise E: Bridge (hip extensors) Lie on your back on a firm surface with your knees bent and your feet flat on the floor. Tighten your buttocks muscles and lift your bottom off the floor until your trunk is level with your thighs. Do not arch your back. You should feel the muscles working in your buttocks and the back of your thighs. Hold this position for 2 seconds. Slowly lower your hips to the starting position. Let your buttocks muscles relax completely between repetitions. Repeat 2 times. Complete this exercise 3 times a week.

## 2022-09-03 ENCOUNTER — Ambulatory Visit: Payer: BC Managed Care – PPO | Admitting: Family Medicine

## 2022-09-05 ENCOUNTER — Telehealth: Payer: Self-pay | Admitting: Family Medicine

## 2022-09-05 ENCOUNTER — Other Ambulatory Visit (INDEPENDENT_AMBULATORY_CARE_PROVIDER_SITE_OTHER): Payer: BC Managed Care – PPO

## 2022-09-05 DIAGNOSIS — E039 Hypothyroidism, unspecified: Secondary | ICD-10-CM

## 2022-09-05 DIAGNOSIS — E669 Obesity, unspecified: Secondary | ICD-10-CM

## 2022-09-05 DIAGNOSIS — E875 Hyperkalemia: Secondary | ICD-10-CM | POA: Diagnosis not present

## 2022-09-05 DIAGNOSIS — I10 Essential (primary) hypertension: Secondary | ICD-10-CM

## 2022-09-05 LAB — BASIC METABOLIC PANEL
BUN: 12 mg/dL (ref 6–23)
CO2: 34 mEq/L — ABNORMAL HIGH (ref 19–32)
Calcium: 9.3 mg/dL (ref 8.4–10.5)
Chloride: 97 mEq/L (ref 96–112)
Creatinine, Ser: 0.72 mg/dL (ref 0.40–1.20)
GFR: 92.03 mL/min (ref >60.00)
Glucose, Bld: 174 mg/dL — ABNORMAL HIGH (ref 70–99)
Potassium: 4.6 mEq/L (ref 3.5–5.1)
Sodium: 134 mEq/L — ABNORMAL LOW (ref 135–145)

## 2022-09-05 MED ORDER — METFORMIN HCL ER 500 MG PO TB24: 1000.0000 mg | ORAL_TABLET | Freq: Two times a day (BID) | ORAL | 1 refills | Status: DC

## 2022-09-05 MED ORDER — GLYBURIDE 5 MG PO TABS: 5.0000 mg | ORAL_TABLET | Freq: Two times a day (BID) | ORAL | 0 refills | Status: DC

## 2022-09-05 MED ORDER — ROSUVASTATIN CALCIUM 5 MG PO TABS: 5.0000 mg | ORAL_TABLET | Freq: Every day | ORAL | 3 refills | Status: DC

## 2022-09-05 MED ORDER — LEVOTHYROXINE SODIUM 125 MCG PO TABS: 125.0000 ug | ORAL_TABLET | Freq: Every day | ORAL | 2 refills | Status: DC

## 2022-09-05 MED ORDER — LISINOPRIL-HYDROCHLOROTHIAZIDE 20-12.5 MG PO TABS: 1.0000 | ORAL_TABLET | Freq: Every day | ORAL | 2 refills | Status: DC

## 2022-09-05 MED ORDER — TRULICITY 1.5 MG/0.5ML ~~LOC~~ SOAJ: 1.5000 mg | SUBCUTANEOUS | 2 refills | Status: DC

## 2022-09-05 NOTE — Telephone Encounter (Signed)
Sent medications in.

## 2022-09-05 NOTE — Telephone Encounter (Signed)
Patient was in for labs and stopped to ask to me send a message to robin for her to give the patient a call about medication questions   [phone]

## 2022-09-17 ENCOUNTER — Other Ambulatory Visit: Payer: Self-pay | Admitting: Family Medicine

## 2022-09-17 DIAGNOSIS — E1169 Type 2 diabetes mellitus with other specified complication: Secondary | ICD-10-CM

## 2022-09-17 MED ORDER — PREGABALIN 75 MG PO CAPS: 75.0000 mg | ORAL_CAPSULE | Freq: Every evening | ORAL | 1 refills | Status: DC

## 2022-09-17 MED ORDER — TIZANIDINE HCL 4 MG PO TABS: 4.0000 mg | ORAL_TABLET | Freq: Four times a day (QID) | ORAL | 1 refills | Status: DC | PRN

## 2022-09-17 NOTE — Telephone Encounter (Signed)
Medication: pregabalin (LYRICA) 75 MG capsule   tiZANidine (ZANAFLEX) 4 MG tablet   Has the patient contacted their pharmacy? Yes.    Pharmacy change so when she does refill request it does not go to right one.   Preferred  [facility] [address], [place] - [address] [place] [address] Phone: [phone]  Fax: [phone]  Pharmacy (with phone number or street name):  Agent: Please be advised that RX refills may take up to 3 business days. We ask that you follow-up with your pharmacy.

## 2022-09-18 ENCOUNTER — Ambulatory Visit: Payer: BC Managed Care – PPO | Admitting: Physical Medicine & Rehabilitation

## 2022-09-19 ENCOUNTER — Telehealth: Payer: Self-pay | Admitting: Family Medicine

## 2022-09-19 NOTE — Telephone Encounter (Signed)
Patient called to refill her amlodipine medication, but it was not on her mychart. She would like for it to be sent to [facility]. Please advise.    [facility] [address], [place] - [address] [place]  [address]

## 2022-09-19 NOTE — Telephone Encounter (Signed)
Documented in correct chart//refill done. Correct spelling of name Lindsey Owen.

## 2022-09-19 NOTE — Telephone Encounter (Signed)
Message opened in incorrect chart//was her husbands chart to be opened/refilled from [facility]. Spoke to the wife and medication refilled.

## 2022-10-29 ENCOUNTER — Telehealth: Payer: Self-pay

## 2022-10-29 ENCOUNTER — Ambulatory Visit: Payer: BC Managed Care – PPO | Admitting: Internal Medicine

## 2022-10-29 NOTE — Telephone Encounter (Signed)
PA initiated via Covermymeds; KEY: [id number].  PA approved.   Effective from 10/29/2022 through 10/28/2023.

## 2022-11-10 ENCOUNTER — Other Ambulatory Visit: Payer: Self-pay | Admitting: Family Medicine

## 2022-11-24 ENCOUNTER — Telehealth: Payer: Self-pay

## 2022-11-24 ENCOUNTER — Telehealth: Payer: Self-pay | Admitting: Family Medicine

## 2022-11-24 MED ORDER — SEMAGLUTIDE (1 MG/DOSE) 4 MG/3ML ~~LOC~~ SOPN: 1.0000 mg | PEN_INJECTOR | SUBCUTANEOUS | 3 refills | Status: DC

## 2022-11-24 NOTE — Telephone Encounter (Signed)
PA initiated via Covermymeds; KEY: [id number]. Awaiting determination

## 2022-11-24 NOTE — Telephone Encounter (Signed)
Patient said her pharmacy does not have Trulicity and she wanted to  know if an alternative medication could be sent to her pharmacy that will be covered by insurance. Please call to advise.

## 2022-11-24 NOTE — Telephone Encounter (Signed)
PA approved.   Effective from 11/24/2022 through 11/23/2023.

## 2022-11-24 NOTE — Telephone Encounter (Signed)
Will send Ozempic, could you have her check with her ins to see what weekly injectables are covered for DM? Ty.

## 2022-11-24 NOTE — Telephone Encounter (Signed)
Patient informed of approval.  

## 2022-11-24 NOTE — Telephone Encounter (Signed)
Called and patient will call back shortly with response

## 2022-11-30 ENCOUNTER — Other Ambulatory Visit: Payer: Self-pay | Admitting: Family Medicine

## 2022-11-30 DIAGNOSIS — H6993 Unspecified Eustachian tube disorder, bilateral: Secondary | ICD-10-CM

## 2022-12-11 DIAGNOSIS — M7912 Myalgia of auxiliary muscles, head and neck: Secondary | ICD-10-CM | POA: Diagnosis not present

## 2022-12-11 DIAGNOSIS — M5412 Radiculopathy, cervical region: Secondary | ICD-10-CM | POA: Diagnosis not present

## 2022-12-11 DIAGNOSIS — M5413 Radiculopathy, cervicothoracic region: Secondary | ICD-10-CM | POA: Diagnosis not present

## 2022-12-15 DIAGNOSIS — M7912 Myalgia of auxiliary muscles, head and neck: Secondary | ICD-10-CM | POA: Diagnosis not present

## 2022-12-15 DIAGNOSIS — M5412 Radiculopathy, cervical region: Secondary | ICD-10-CM | POA: Diagnosis not present

## 2022-12-15 DIAGNOSIS — M546 Pain in thoracic spine: Secondary | ICD-10-CM | POA: Diagnosis not present

## 2022-12-15 DIAGNOSIS — M5413 Radiculopathy, cervicothoracic region: Secondary | ICD-10-CM | POA: Diagnosis not present

## 2022-12-27 ENCOUNTER — Other Ambulatory Visit: Payer: Self-pay | Admitting: Family Medicine

## 2022-12-27 DIAGNOSIS — H6993 Unspecified Eustachian tube disorder, bilateral: Secondary | ICD-10-CM

## 2022-12-29 DIAGNOSIS — M546 Pain in thoracic spine: Secondary | ICD-10-CM | POA: Diagnosis not present

## 2022-12-29 DIAGNOSIS — M5413 Radiculopathy, cervicothoracic region: Secondary | ICD-10-CM | POA: Diagnosis not present

## 2022-12-29 DIAGNOSIS — M5412 Radiculopathy, cervical region: Secondary | ICD-10-CM | POA: Diagnosis not present

## 2022-12-29 DIAGNOSIS — M7912 Myalgia of auxiliary muscles, head and neck: Secondary | ICD-10-CM | POA: Diagnosis not present

## 2023-01-01 DIAGNOSIS — M546 Pain in thoracic spine: Secondary | ICD-10-CM | POA: Diagnosis not present

## 2023-01-01 DIAGNOSIS — M7912 Myalgia of auxiliary muscles, head and neck: Secondary | ICD-10-CM | POA: Diagnosis not present

## 2023-01-01 DIAGNOSIS — M5412 Radiculopathy, cervical region: Secondary | ICD-10-CM | POA: Diagnosis not present

## 2023-01-01 DIAGNOSIS — M5413 Radiculopathy, cervicothoracic region: Secondary | ICD-10-CM | POA: Diagnosis not present

## 2023-01-06 DIAGNOSIS — M5412 Radiculopathy, cervical region: Secondary | ICD-10-CM | POA: Diagnosis not present

## 2023-01-06 DIAGNOSIS — M5413 Radiculopathy, cervicothoracic region: Secondary | ICD-10-CM | POA: Diagnosis not present

## 2023-01-06 DIAGNOSIS — M7912 Myalgia of auxiliary muscles, head and neck: Secondary | ICD-10-CM | POA: Diagnosis not present

## 2023-01-06 DIAGNOSIS — M546 Pain in thoracic spine: Secondary | ICD-10-CM | POA: Diagnosis not present

## 2023-01-08 ENCOUNTER — Telehealth: Payer: Self-pay | Admitting: Family Medicine

## 2023-01-08 NOTE — Telephone Encounter (Signed)
Pt states the pharmacy is out of Trulicity and was wondering if she could get an alternative.     [place], [place]. [address] [place] [address] Phone: [phone]  Fax: [phone]

## 2023-01-08 NOTE — Telephone Encounter (Signed)
Semaglutide/Ozempic was sent a month ago, is that what she is talking about?

## 2023-01-09 MED ORDER — TIRZEPATIDE 7.5 MG/0.5ML ~~LOC~~ SOAJ: 7.5000 mg | SUBCUTANEOUS | 3 refills | Status: DC

## 2023-01-09 MED ORDER — SEMAGLUTIDE (1 MG/DOSE) 4 MG/3ML ~~LOC~~ SOPN: 1.0000 mg | PEN_INJECTOR | SUBCUTANEOUS | 3 refills | Status: DC

## 2023-01-09 NOTE — Telephone Encounter (Signed)
She did not pickup ozempic because of the needle She said Lindsey Owen would be the alternative.

## 2023-01-09 NOTE — Telephone Encounter (Signed)
She does not want ozempic because she is afraid of the needle,

## 2023-01-09 NOTE — Telephone Encounter (Signed)
Patient informed [facility] sent in.

## 2023-01-09 NOTE — Telephone Encounter (Signed)
Will send Mounjaro.  We discussed the case with the pharmacy team and the needle is hidden on this 1.  Thank you.

## 2023-01-09 NOTE — Addendum Note (Signed)
Addended by: Ames Coupe on: 01/09/2023 04:31 PM   Modules accepted: Orders

## 2023-01-09 NOTE — Telephone Encounter (Signed)
The difference is that Trulictiy has a hidden needle/whereas ozempic you have to attach and can be seen.  It scares her

## 2023-01-21 DIAGNOSIS — M546 Pain in thoracic spine: Secondary | ICD-10-CM | POA: Diagnosis not present

## 2023-01-21 DIAGNOSIS — M7912 Myalgia of auxiliary muscles, head and neck: Secondary | ICD-10-CM | POA: Diagnosis not present

## 2023-01-21 DIAGNOSIS — M5413 Radiculopathy, cervicothoracic region: Secondary | ICD-10-CM | POA: Diagnosis not present

## 2023-01-21 DIAGNOSIS — M5412 Radiculopathy, cervical region: Secondary | ICD-10-CM | POA: Diagnosis not present

## 2023-01-29 DIAGNOSIS — M546 Pain in thoracic spine: Secondary | ICD-10-CM | POA: Diagnosis not present

## 2023-01-29 DIAGNOSIS — M7912 Myalgia of auxiliary muscles, head and neck: Secondary | ICD-10-CM | POA: Diagnosis not present

## 2023-01-29 DIAGNOSIS — M5413 Radiculopathy, cervicothoracic region: Secondary | ICD-10-CM | POA: Diagnosis not present

## 2023-01-29 DIAGNOSIS — M5412 Radiculopathy, cervical region: Secondary | ICD-10-CM | POA: Diagnosis not present

## 2023-02-03 DIAGNOSIS — M546 Pain in thoracic spine: Secondary | ICD-10-CM | POA: Diagnosis not present

## 2023-02-03 DIAGNOSIS — M5413 Radiculopathy, cervicothoracic region: Secondary | ICD-10-CM | POA: Diagnosis not present

## 2023-02-03 DIAGNOSIS — M5412 Radiculopathy, cervical region: Secondary | ICD-10-CM | POA: Diagnosis not present

## 2023-02-03 DIAGNOSIS — M7912 Myalgia of auxiliary muscles, head and neck: Secondary | ICD-10-CM | POA: Diagnosis not present

## 2023-02-10 DIAGNOSIS — M5413 Radiculopathy, cervicothoracic region: Secondary | ICD-10-CM | POA: Diagnosis not present

## 2023-02-10 DIAGNOSIS — M546 Pain in thoracic spine: Secondary | ICD-10-CM | POA: Diagnosis not present

## 2023-02-10 DIAGNOSIS — M7912 Myalgia of auxiliary muscles, head and neck: Secondary | ICD-10-CM | POA: Diagnosis not present

## 2023-02-10 DIAGNOSIS — M5412 Radiculopathy, cervical region: Secondary | ICD-10-CM | POA: Diagnosis not present

## 2023-02-25 ENCOUNTER — Other Ambulatory Visit: Payer: Self-pay | Admitting: Family Medicine

## 2023-02-25 DIAGNOSIS — E1169 Type 2 diabetes mellitus with other specified complication: Secondary | ICD-10-CM

## 2023-03-17 ENCOUNTER — Other Ambulatory Visit: Payer: Self-pay | Admitting: Family Medicine

## 2023-03-17 DIAGNOSIS — E1169 Type 2 diabetes mellitus with other specified complication: Secondary | ICD-10-CM

## 2023-03-25 ENCOUNTER — Ambulatory Visit (INDEPENDENT_AMBULATORY_CARE_PROVIDER_SITE_OTHER): Payer: BC Managed Care – PPO | Admitting: Family Medicine

## 2023-03-25 ENCOUNTER — Encounter: Payer: Self-pay | Admitting: Family Medicine

## 2023-03-25 VITALS — BP 130/83 | HR 64 | Temp 98.4°F | Ht 62.5 in | Wt 169.1 lb

## 2023-03-25 DIAGNOSIS — Z0000 Encounter for general adult medical examination without abnormal findings: Secondary | ICD-10-CM | POA: Diagnosis not present

## 2023-03-25 DIAGNOSIS — E669 Obesity, unspecified: Secondary | ICD-10-CM

## 2023-03-25 DIAGNOSIS — Z7985 Long-term (current) use of injectable non-insulin antidiabetic drugs: Secondary | ICD-10-CM

## 2023-03-25 DIAGNOSIS — Z0110 Encounter for examination of ears and hearing without abnormal findings: Secondary | ICD-10-CM

## 2023-03-25 DIAGNOSIS — Z7984 Long term (current) use of oral hypoglycemic drugs: Secondary | ICD-10-CM

## 2023-03-25 DIAGNOSIS — E1169 Type 2 diabetes mellitus with other specified complication: Secondary | ICD-10-CM

## 2023-03-25 DIAGNOSIS — Z6830 Body mass index (BMI) 30.0-30.9, adult: Secondary | ICD-10-CM | POA: Diagnosis not present

## 2023-03-25 DIAGNOSIS — Z1231 Encounter for screening mammogram for malignant neoplasm of breast: Secondary | ICD-10-CM

## 2023-03-25 LAB — LIPID PANEL
Cholesterol: 176 mg/dL (ref 0–200)
HDL: 49.8 mg/dL (ref >39.00)
LDL Cholesterol: 96 mg/dL (ref 0–99)
NonHDL: 126.64
Total CHOL/HDL Ratio: 4
Triglycerides: 151 mg/dL — ABNORMAL HIGH (ref 0.0–149.0)
VLDL: 30.2 mg/dL (ref 0.0–40.0)

## 2023-03-25 LAB — HEMOGLOBIN A1C: Hgb A1c MFr Bld: 6.7 % — ABNORMAL HIGH (ref 4.6–6.5)

## 2023-03-25 LAB — CBC
HCT: 39.1 % (ref 36.0–46.0)
Hemoglobin: 12.9 g/dL (ref 12.0–15.0)
MCHC: 32.9 g/dL (ref 30.0–36.0)
MCV: 87.9 fl (ref 78.0–100.0)
Platelets: 162 10*3/uL (ref 150.0–400.0)
RBC: 4.44 Mil/uL (ref 3.87–5.11)
RDW: 14.7 % (ref 11.5–15.5)
WBC: 6.5 10*3/uL (ref 4.0–10.5)

## 2023-03-25 LAB — COMPREHENSIVE METABOLIC PANEL
ALT: 29 U/L (ref 0–35)
AST: 36 U/L (ref 0–37)
Albumin: 3.9 g/dL (ref 3.5–5.2)
Alkaline Phosphatase: 46 U/L (ref 39–117)
BUN: 7 mg/dL (ref 6–23)
CO2: 28 mEq/L (ref 19–32)
Calcium: 9 mg/dL (ref 8.4–10.5)
Chloride: 103 mEq/L (ref 96–112)
Creatinine, Ser: 0.78 mg/dL (ref 0.40–1.20)
GFR: 83.28 mL/min (ref >60.00)
Glucose, Bld: 139 mg/dL — ABNORMAL HIGH (ref 70–99)
Potassium: 4.5 mEq/L (ref 3.5–5.1)
Sodium: 141 mEq/L (ref 135–145)
Total Bilirubin: 1 mg/dL (ref 0.2–1.2)
Total Protein: 7.3 g/dL (ref 6.0–8.3)

## 2023-03-25 MED ORDER — GLYBURIDE 5 MG PO TABS: 5.0000 mg | ORAL_TABLET | Freq: Two times a day (BID) | ORAL | 3 refills | Status: DC

## 2023-03-25 MED ORDER — TIRZEPATIDE 7.5 MG/0.5ML ~~LOC~~ SOAJ: 7.5000 mg | SUBCUTANEOUS | 8 refills | Status: DC

## 2023-03-25 NOTE — Progress Notes (Signed)
Chief Complaint  Patient presents with   Annual Exam     Well Woman Lindsey Owen is here for a complete physical.   Her last physical was >1 year ago.  Current diet: in general, a "healthy" diet. Current exercise: none. Weight is stable and she denies fatigue out of ordinary. Seatbelt? Yes Advanced directive? Yes  Health Maintenance Pap/HPV- No Mammogram- No Colon cancer screening-Yes Shingrix- No Tetanus- Yes Hep C screening- Yes HIV screening- Yes  Past Medical History:  Diagnosis Date   Diabetes mellitus without complication (HCC)    Hypertension    NAFLD (nonalcoholic fatty liver disease)    Thyroid disease      Past Surgical History:  Procedure Laterality Date   CERVICAL SPINE SURGERY     CESAREAN SECTION  10/20/1998   UTERINE FIBROID SURGERY      Medications  Current Outpatient Medications on File Prior to Visit  Medication Sig Dispense Refill   clobetasol (TEMOVATE) 0.05 % external solution Apply 1 application topically 2 (two) times daily. 50 mL 2   glyBURIDE (DIABETA) 5 MG tablet Take 1 tablet (5 mg total) by mouth 2 (two) times daily with a meal. 180 tablet 0   levocetirizine (XYZAL) 5 MG tablet TAKE 1 TABLET BY MOUTH ONCE DAILY IN THE EVENING 30 tablet 0   levothyroxine (EUTHYROX) 125 MCG tablet Take 1 tablet (125 mcg total) by mouth daily before breakfast. 90 tablet 2   lisinopril-hydrochlorothiazide (ZESTORETIC) 20-12.5 MG tablet Take 1 tablet by mouth daily. 90 tablet 2   metFORMIN (GLUCOPHAGE-XR) 500 MG 24 hr tablet TAKE 2 TABLETS BY MOUTH IN THE MORNING AND AT BEDTIME 360 tablet 0   naproxen (NAPROSYN) 500 MG tablet Take 1 tablet (500 mg total) by mouth 2 (two) times daily with a meal. 60 tablet 3   pregabalin (LYRICA) 75 MG capsule Take 1 capsule by mouth at bedtime 90 capsule 1   rosuvastatin (CRESTOR) 5 MG tablet Take 1 tablet (5 mg total) by mouth daily. 90 tablet 3   tirzepatide (MOUNJARO) 7.5 MG/0.5ML Pen Inject 7.5 mg into the skin once a  week. 2 mL 3   tiZANidine (ZANAFLEX) 4 MG tablet Take 1 tablet (4 mg total) by mouth every 6 (six) hours as needed for muscle spasms. 90 tablet 1    Allergies No Known Allergies  Review of Systems: Constitutional:  no unexpected weight changes Eye:  no recent significant change in vision Ear/Nose/Mouth/Throat:  Ears:  no recent change in hearing Nose/Mouth/Throat:  no complaints of nasal congestion, no sore throat Cardiovascular: no chest pain Respiratory:  no shortness of breath Gastrointestinal:  no abdominal pain, no change in bowel habits GU:  Female: negative for dysuria or pelvic pain Musculoskeletal/Extremities: +hand pain; otherwise no new pain of the joints Integumentary (Skin/Breast):  no abnormal skin lesions reported Neurologic:  no headaches Endocrine:  denies fatigue  Exam BP 130/83 (BP Location: Right Arm, Patient Position: Sitting, Cuff Size: Normal)   Pulse 64   Temp 98.4 F (36.9 C) (Oral)   Ht 5' 2.5" (1.588 m)   Wt 169 lb 2 oz (76.7 kg)   SpO2 98%   BMI 30.44 kg/m  General:  well developed, well nourished, in no apparent distress Skin: scaly skin between toes throughout both feet; otherwise no significant moles, warts, or growths Head:  no masses, lesions, or tenderness Eyes:  pupils equal and round, sclera anicteric without injection Ears:  canals without lesions, TMs shiny without retraction, no obvious effusion, no erythema  Nose:  nares patent, mucosa normal, and no drainage  Throat/Pharynx:  lips and gingiva without lesion; tongue and uvula midline; non-inflamed pharynx; no exudates or postnasal drainage Neck: neck supple without adenopathy, thyromegaly, or masses Lungs:  clear to auscultation, breath sounds equal bilaterally, no respiratory distress Cardio:  regular rate and rhythm, no LE edema Abdomen:  abdomen soft, nontender; bowel sounds normal; no masses or organomegaly Genital: Defer to GYN Musculoskeletal:  symmetrical muscle groups noted  without atrophy or deformity Extremities:  no clubbing, cyanosis, or edema, no deformities, no skin discoloration Neuro: sensation intact to pinprick b/l over feet; gait normal; deep tendon reflexes normal and symmetric Psych: well oriented with normal range of affect and appropriate judgment/insight  Assessment and Plan  Well adult exam - Plan: Lipid panel, CBC, Comprehensive metabolic panel  Type 2 diabetes mellitus with obesity (HCC) - Plan: Microalbumin / creatinine urine ratio, Hemoglobin A1c  Encounter for screening mammogram for malignant neoplasm of breast - Plan: MM DIGITAL SCREENING BILATERAL   Well 58 y.o. female. Counseled on diet and exercise. GYN info provided. Mammogram ordered today.  Shingrix rec'd.  Other orders as above. Advanced directive form requested today.  Follow up in 6 mo. The patient voiced understanding and agreement to the plan.  Jilda Roche [place], DO 03/25/23 9:56 AM

## 2023-03-25 NOTE — Addendum Note (Signed)
Addended by: Scharlene Gloss B on: 03/25/2023 12:58 PM   Modules accepted: Orders

## 2023-03-25 NOTE — Patient Instructions (Addendum)
Give [place] 2-3 business days to get the results of your labs back.   Keep the diet clean and stay active.  Aim to do some physical exertion for 150 minutes per week. This is typically divided into 5 days per week, 30 minutes per day. The activity should be enough to get your heart rate up. Anything is better than nothing if you have time constraints.  The Shingrix vaccine (for shingles) is a 2 shot series spaced 2-6 months apart. It can make people feel low energy, achy and almost like they have the flu for 48 hours after injection. 1/5 people can have nausea and/or vomiting. Please plan accordingly when deciding on when to get this shot. Call our office for a nurse visit appointment to get this. The second shot of the series is less severe regarding the side effects, but it still lasts 48 hours.   Call [facility] at [phone] for an appointment.  They are located at [address], [address], [place], [place], [address] (right across the hall from our office).  Let us know if you need anything.  Hand Exercises Hand exercises can be helpful for almost anyone. These exercises can strengthen the hands, improve flexibility and movement, and increase blood flow to the hands. These results can make work and daily tasks easier. Hand exercises can be especially helpful for people who have joint pain from arthritis or have nerve damage from overuse (carpal tunnel syndrome). These exercises can also help people who have injured a hand. Exercises Most of these hand exercises are gentle stretching and motion exercises. It is usually safe to do them often throughout the day. Warming up your hands before exercise may help to reduce stiffness. You can do this with gentle massage or by placing your hands in warm water for 10-15 minutes. It is normal to feel some stretching, pulling, tightness, or mild discomfort as you begin new exercises. This will gradually improve. Stop  an exercise right away if you feel sudden, severe pain or your pain gets worse. Ask your health care provider which exercises are best for you. Knuckle bend or "claw" fist Stand or sit with your arm, hand, and all five fingers pointed straight up. Make sure to keep your wrist straight during the exercise. Gently bend your fingers down toward your palm until the tips of your fingers are touching the top of your palm. Keep your big knuckle straight and just bend the small knuckles in your fingers. Hold this position for 3 seconds. Straighten (extend) your fingers back to the starting position. Repeat this exercise 5-10 times with each hand. Full finger fist Stand or sit with your arm, hand, and all five fingers pointed straight up. Make sure to keep your wrist straight during the exercise. Gently bend your fingers into your palm until the tips of your fingers are touching the middle of your palm. Hold this position for 3 seconds. Extend your fingers back to the starting position, stretching every joint fully. Repeat this exercise 5-10 times with each hand. Straight fist Stand or sit with your arm, hand, and all five fingers pointed straight up. Make sure to keep your wrist straight during the exercise. Gently bend your fingers at the big knuckle, where your fingers meet your hand, and the middle knuckle. Keep the knuckle at the tips of your fingers straight and try to touch the bottom of your palm. Hold this position for 3 seconds. Extend your  fingers back to the starting position, stretching every joint fully. Repeat this exercise 5-10 times with each hand. Tabletop Stand or sit with your arm, hand, and all five fingers pointed straight up. Make sure to keep your wrist straight during the exercise. Gently bend your fingers at the big knuckle, where your fingers meet your hand, as far down as you can while keeping the small knuckles in your fingers straight. Think of forming a tabletop with your  fingers. Hold this position for 3 seconds. Extend your fingers back to the starting position, stretching every joint fully. Repeat this exercise 5-10 times with each hand. Finger spread Place your hand flat on a table with your palm facing down. Make sure your wrist stays straight as you do this exercise. Spread your fingers and thumb apart from each other as far as you can until you feel a gentle stretch. Hold this position for 3 seconds. Bring your fingers and thumb tight together again. Hold this position for 3 seconds. Repeat this exercise 5-10 times with each hand. Making circles Stand or sit with your arm, hand, and all five fingers pointed straight up. Make sure to keep your wrist straight during the exercise. Make a circle by touching the tip of your thumb to the tip of your index finger. Hold for 3 seconds. Then open your hand wide. Repeat this motion with your thumb and each finger on your hand. Repeat this exercise 5-10 times with each hand. Thumb motion Sit with your forearm resting on a table and your wrist straight. Your thumb should be facing up toward the ceiling. Keep your fingers relaxed as you move your thumb. Lift your thumb up as high as you can toward the ceiling. Hold for 3 seconds. Bend your thumb across your palm as far as you can, reaching the tip of your thumb for the small finger (pinkie) side of your palm. Hold for 3 seconds. Repeat this exercise 5-10 times with each hand. Grip strengthening  Hold a stress ball or other soft ball in the middle of your hand. Slowly increase the pressure, squeezing the ball as much as you can without causing pain. Think of bringing the tips of your fingers into the middle of your palm. All of your finger joints should bend when doing this exercise. Hold your squeeze for 3 seconds, then relax. Repeat this exercise 5-10 times with each hand. Contact a health care provider if: Your hand pain or discomfort gets much worse when you do  an exercise. Your hand pain or discomfort does not improve within 2 hours after you exercise. If you have any of these problems, stop doing these exercises right away. Do not do them again unless your health care provider says that you can. Get help right away if: You develop sudden, severe hand pain or swelling. If this happens, stop doing these exercises right away. Do not do them again unless your health care provider says that you can. Make sure you discuss any questions you have with your health care provider. Document Revised: 01/27/2019 Document Reviewed: 10/07/2018 Elsevier Patient Education  2020 [employer].

## 2023-03-26 LAB — MICROALBUMIN / CREATININE URINE RATIO
Creatinine,U: 119 mg/dL
Microalb Creat Ratio: 0.9 mg/g (ref 0.0–30.0)
Microalb, Ur: 1 mg/dL (ref 0.0–1.9)

## 2023-03-30 ENCOUNTER — Telehealth: Payer: Self-pay | Admitting: Family Medicine

## 2023-03-30 ENCOUNTER — Telehealth (HOSPITAL_BASED_OUTPATIENT_CLINIC_OR_DEPARTMENT_OTHER): Payer: Self-pay

## 2023-03-30 NOTE — Telephone Encounter (Signed)
Please give patient a call. Pt said she is supposed to have a mammogram but has some questions.

## 2023-03-30 NOTE — Telephone Encounter (Signed)
The patient tried to schedule her mammo but they said there were other things she needed to take care of first and they were uncertain what they were referring to.

## 2023-03-31 NOTE — Telephone Encounter (Signed)
If you look back in her 2021 records she had some problems at that time. Imaging is going to call [facility] (because she has always had her mammo's there and we have no idea if she returned for her diagnostic of not, until then a routine cannot be scheduled) Imaging is going to take care of figuring this out and will let the patient know how to proceed.

## 2023-04-02 ENCOUNTER — Ambulatory Visit (HOSPITAL_BASED_OUTPATIENT_CLINIC_OR_DEPARTMENT_OTHER)
Admission: RE | Admit: 2023-04-02 | Discharge: 2023-04-02 | Disposition: A | Discharge status: Home / Self Care | Payer: BC Managed Care – PPO | Source: Ambulatory Visit | Attending: Family Medicine | Admitting: Family Medicine

## 2023-04-02 ENCOUNTER — Encounter (HOSPITAL_BASED_OUTPATIENT_CLINIC_OR_DEPARTMENT_OTHER): Payer: Self-pay

## 2023-04-02 ENCOUNTER — Inpatient Hospital Stay (HOSPITAL_BASED_OUTPATIENT_CLINIC_OR_DEPARTMENT_OTHER): Admission: RE | Admit: 2023-04-02 | Payer: BC Managed Care – PPO | Source: Ambulatory Visit

## 2023-04-02 DIAGNOSIS — Z1231 Encounter for screening mammogram for malignant neoplasm of breast: Secondary | ICD-10-CM | POA: Insufficient documentation

## 2023-04-09 ENCOUNTER — Ambulatory Visit: Payer: BC Managed Care – PPO | Admitting: Audiologist

## 2023-05-07 ENCOUNTER — Encounter: Payer: Self-pay | Admitting: Family Medicine

## 2023-06-03 ENCOUNTER — Other Ambulatory Visit: Payer: Self-pay | Admitting: Family Medicine

## 2023-06-03 DIAGNOSIS — E1169 Type 2 diabetes mellitus with other specified complication: Secondary | ICD-10-CM

## 2023-06-09 ENCOUNTER — Other Ambulatory Visit: Payer: Self-pay | Admitting: Family Medicine

## 2023-06-09 DIAGNOSIS — E669 Obesity, unspecified: Secondary | ICD-10-CM

## 2023-06-09 DIAGNOSIS — E039 Hypothyroidism, unspecified: Secondary | ICD-10-CM

## 2023-07-13 ENCOUNTER — Telehealth: Payer: Self-pay | Admitting: Family Medicine

## 2023-07-13 NOTE — Telephone Encounter (Signed)
Her labs were pretty good also. I had planned to see her in Dec. If she wants to come in sooner, she is welcome to do so. Ty.

## 2023-07-13 NOTE — Telephone Encounter (Signed)
Pt called stating that it is time for her and her husband to have their lab work done again to check their levels. Advised a message would be sent back to look into this for them.

## 2023-07-13 NOTE — Telephone Encounter (Signed)
Patient if ok regarding PCP's message.

## 2023-07-13 NOTE — Telephone Encounter (Signed)
Last CPE was on 03/25/23

## 2023-08-18 ENCOUNTER — Telehealth: Payer: Self-pay | Admitting: Family Medicine

## 2023-08-18 NOTE — Telephone Encounter (Signed)
Pt called stating that the dosage for her Greggory Keen was "really heavy" and wanted to discuss what options she had to change it.

## 2023-08-19 NOTE — Telephone Encounter (Signed)
Called pt was advised she will need appt, pt stated she is out of town but would call to schedule appt when she back in town.

## 2023-08-31 ENCOUNTER — Ambulatory Visit: Payer: BC Managed Care – PPO | Admitting: Family Medicine

## 2023-09-01 ENCOUNTER — Ambulatory Visit (INDEPENDENT_AMBULATORY_CARE_PROVIDER_SITE_OTHER): Payer: BC Managed Care – PPO | Admitting: Family Medicine

## 2023-09-01 ENCOUNTER — Encounter: Payer: Self-pay | Admitting: Family Medicine

## 2023-09-01 VITALS — BP 128/80 | HR 78 | Temp 98.0°F | Resp 16 | Ht 62.0 in | Wt 159.8 lb

## 2023-09-01 DIAGNOSIS — Z6829 Body mass index (BMI) 29.0-29.9, adult: Secondary | ICD-10-CM | POA: Diagnosis not present

## 2023-09-01 DIAGNOSIS — E669 Obesity, unspecified: Secondary | ICD-10-CM

## 2023-09-01 DIAGNOSIS — E1169 Type 2 diabetes mellitus with other specified complication: Secondary | ICD-10-CM | POA: Diagnosis not present

## 2023-09-01 DIAGNOSIS — Z7984 Long term (current) use of oral hypoglycemic drugs: Secondary | ICD-10-CM | POA: Diagnosis not present

## 2023-09-01 DIAGNOSIS — R252 Cramp and spasm: Secondary | ICD-10-CM

## 2023-09-01 DIAGNOSIS — G8929 Other chronic pain: Secondary | ICD-10-CM

## 2023-09-01 DIAGNOSIS — I10 Essential (primary) hypertension: Secondary | ICD-10-CM

## 2023-09-01 DIAGNOSIS — E039 Hypothyroidism, unspecified: Secondary | ICD-10-CM

## 2023-09-01 DIAGNOSIS — M545 Low back pain, unspecified: Secondary | ICD-10-CM

## 2023-09-01 LAB — IBC + FERRITIN
Ferritin: 42.8 ng/mL (ref 10.0–291.0)
Iron: 105 ug/dL (ref 42–145)
Saturation Ratios: 29.6 % (ref 20.0–50.0)
TIBC: 354.2 ug/dL (ref 250.0–450.0)
Transferrin: 253 mg/dL (ref 212.0–360.0)

## 2023-09-01 LAB — LIPID PANEL
Cholesterol: 171 mg/dL (ref 0–200)
HDL: 62.1 mg/dL (ref >39.00)
LDL Cholesterol: 94 mg/dL (ref 0–99)
NonHDL: 108.58
Total CHOL/HDL Ratio: 3
Triglycerides: 72 mg/dL (ref 0.0–149.0)
VLDL: 14.4 mg/dL (ref 0.0–40.0)

## 2023-09-01 LAB — TSH: TSH: 3.83 u[IU]/mL (ref 0.35–5.50)

## 2023-09-01 LAB — COMPREHENSIVE METABOLIC PANEL
ALT: 15 U/L (ref 0–35)
AST: 22 U/L (ref 0–37)
Albumin: 4.1 g/dL (ref 3.5–5.2)
Alkaline Phosphatase: 48 U/L (ref 39–117)
BUN: 15 mg/dL (ref 6–23)
CO2: 28 meq/L (ref 19–32)
Calcium: 9.2 mg/dL (ref 8.4–10.5)
Chloride: 100 meq/L (ref 96–112)
Creatinine, Ser: 0.86 mg/dL (ref 0.40–1.20)
GFR: 73.84 mL/min (ref >60.00)
Glucose, Bld: 91 mg/dL (ref 70–99)
Potassium: 4.6 meq/L (ref 3.5–5.1)
Sodium: 137 meq/L (ref 135–145)
Total Bilirubin: 0.9 mg/dL (ref 0.2–1.2)
Total Protein: 7.4 g/dL (ref 6.0–8.3)

## 2023-09-01 LAB — T4, FREE: Free T4: 1.03 ng/dL (ref 0.60–1.60)

## 2023-09-01 LAB — MAGNESIUM: Magnesium: 1.8 mg/dL (ref 1.5–2.5)

## 2023-09-01 LAB — HEMOGLOBIN A1C: Hgb A1c MFr Bld: 5.9 % (ref 4.6–6.5)

## 2023-09-01 MED ORDER — ROSUVASTATIN CALCIUM 5 MG PO TABS: 5.0000 mg | ORAL_TABLET | Freq: Every day | ORAL | 3 refills | Status: AC

## 2023-09-01 MED ORDER — LISINOPRIL-HYDROCHLOROTHIAZIDE 20-12.5 MG PO TABS: 1.0000 | ORAL_TABLET | Freq: Every day | ORAL | 2 refills | Status: DC

## 2023-09-01 MED ORDER — METFORMIN HCL ER 500 MG PO TB24
500.0000 mg | ORAL_TABLET | Freq: Two times a day (BID) | ORAL | Status: AC
Start: 2023-09-01 — End: ?

## 2023-09-01 MED ORDER — LEVOTHYROXINE SODIUM 125 MCG PO TABS: 125.0000 ug | ORAL_TABLET | Freq: Every day | ORAL | 2 refills | Status: DC

## 2023-09-01 MED ORDER — TIZANIDINE HCL 4 MG PO TABS: 4.0000 mg | ORAL_TABLET | Freq: Four times a day (QID) | ORAL | 0 refills | Status: DC | PRN

## 2023-09-01 MED ORDER — PREGABALIN 75 MG PO CAPS
75.0000 mg | ORAL_CAPSULE | Freq: Every day | ORAL | 1 refills | Status: DC
Start: 2023-09-01 — End: 2023-11-16

## 2023-09-01 NOTE — Patient Instructions (Addendum)
Give [place] 2-3 business days to get the results of your labs back.   Schedule your eye exam.  Keep the diet clean and stay active.  Aim to do some physical exertion for 150 minutes per week. This is typically divided into 5 days per week, 30 minutes per day. The activity should be enough to get your heart rate up. Anything is better than nothing if you have time constraints.  If you do not hear anything about your referral in the next 1-2 weeks, call our office and ask for an update.  Let us know if you need anything.

## 2023-09-01 NOTE — Progress Notes (Signed)
Subjective:   Chief Complaint  Patient presents with   Medication Problem    Discuss Medication    Lindsey Owen is a 57 y.o. female here for follow-up of diabetes.   [name]'s self monitored glucose range is 90-low 100's.  Patient denies hypoglycemic reactions. She checks her glucose levels 3 time(s) per week Patient does not require insulin.   Medications include: Mounjaro 7.5 mg/week, glyburide 5 mg bid, metformin XR 1000 mg bid Diet is healthy.  Exercise: yoga, walking No CP or SOB.   Hypertension Patient presents for hypertension follow up. She does not monitor home blood pressures. She is compliant with medications- Zestoretic 20-12.5 mg/d. Patient has these side effects of medication: none Diet/exercise as above.   Patient has a history of leg cramps.  She tried taking pickle juice and mustard without relief.  She tries to stay well-hydrated.  She took a muscle relaxer which was not helpful either.  She does not stretch routinely.  No recent trauma or injury.  She does have chronic low back pain.  She has done stretches and exercises for that.  She has never seen a specialist.  Past Medical History:  Diagnosis Date   Diabetes mellitus without complication (HCC)    Hypertension    NAFLD (nonalcoholic fatty liver disease)    Thyroid disease      Related testing: Retinal exam: Due Pneumovax: done  Objective:  BP 128/80 (BP Location: Left Arm, Patient Position: Sitting, Cuff Size: Normal)   Pulse 78   Temp 98 F (36.7 C) (Oral)   Resp 16   Ht 5\' 2"  (1.575 m)   Wt 159 lb 12.8 oz (72.5 kg)   SpO2 98%   BMI 29.23 kg/m  General:  Well developed, well nourished, in no apparent distress Skin:  Warm, no pallor or diaphoresis Lungs:  CTAB, no access msc use Cardio:  RRR, no bruits, no LE edema MSK: TTP in the lumbar paraspinal muscular region bilaterally.  No significant midline tenderness.  No deformity or edema. Neuro: Negative straight leg bilaterally, DTRs  equal and symmetric in lower extremities throughout, no clonus Psych: Age appropriate judgment and insight  Assessment:   Type 2 diabetes mellitus with obesity (HCC) - Plan: pregabalin (LYRICA) 75 MG capsule, Comprehensive metabolic panel, Lipid panel, Hemoglobin A1c, metFORMIN (GLUCOPHAGE-XR) 500 MG 24 hr tablet  Leg cramping - Plan: CBC, Comprehensive metabolic panel, Magnesium, IBC + Ferritin  Hypothyroidism, unspecified type - Plan: levothyroxine (SYNTHROID) 125 MCG tablet, TSH, T4, free  Essential hypertension - Plan: lisinopril-hydrochlorothiazide (ZESTORETIC) 20-12.5 MG tablet  Chronic bilateral low back pain, unspecified whether sciatica present - Plan: Ambulatory referral to Orthopedic Surgery   Plan:   Chronic, hopefully stable.  Continue metformin XR 500 mg twice daily, Mounjaro 7.5 mg weekly, glyburide 5 mg twice daily.  Hopefully we can stop the glyburide.  Might need to increase the doses of metformin or even Mounjaro.  Counseled on diet and exercise.  Instructed to schedule her eye exam. Check above labs.  Stay hydrated.  Could be related to back pain. Check above.  Continue levothyroxine. Chronic, stable.  Continue Zestoretic 20-12.5 mg daily. Refer to Ortho spine for further evaluation. F/u pending above. The patient voiced understanding and agreement to the plan.  Lindsey Roche [place], DO 09/01/23 11:58 AM

## 2023-09-02 ENCOUNTER — Other Ambulatory Visit: Payer: Self-pay | Admitting: Family Medicine

## 2023-09-02 LAB — CBC
HCT: 41.1 % (ref 36.0–46.0)
Hemoglobin: 13.7 g/dL (ref 12.0–15.0)
MCHC: 33.4 g/dL (ref 30.0–36.0)
MCV: 89.5 fL (ref 78.0–100.0)
Platelets: 177 10*3/uL (ref 150.0–400.0)
RBC: 4.59 Mil/uL (ref 3.87–5.11)
RDW: 13.2 % (ref 11.5–15.5)
WBC: 5.7 10*3/uL (ref 4.0–10.5)

## 2023-09-02 MED ORDER — TIRZEPATIDE 10 MG/0.5ML ~~LOC~~ SOAJ: 10.0000 mg | SUBCUTANEOUS | 3 refills | Status: DC

## 2023-09-22 ENCOUNTER — Ambulatory Visit: Payer: BC Managed Care – PPO | Admitting: Physician Assistant

## 2023-09-23 ENCOUNTER — Ambulatory Visit (INDEPENDENT_AMBULATORY_CARE_PROVIDER_SITE_OTHER): Payer: BC Managed Care – PPO | Admitting: Family Medicine

## 2023-09-23 ENCOUNTER — Encounter: Payer: Self-pay | Admitting: Family Medicine

## 2023-09-23 VITALS — BP 126/78 | HR 71 | Temp 98.0°F | Resp 16 | Ht 62.0 in | Wt 160.6 lb

## 2023-09-23 DIAGNOSIS — J101 Influenza due to other identified influenza virus with other respiratory manifestations: Secondary | ICD-10-CM | POA: Diagnosis not present

## 2023-09-23 LAB — POCT INFLUENZA A/B
Influenza A, POC: POSITIVE — AB
Influenza B, POC: NEGATIVE

## 2023-09-23 LAB — POC COVID19 BINAXNOW: SARS Coronavirus 2 Ag: NEGATIVE

## 2023-09-23 LAB — POCT RAPID STREP A (OFFICE): Rapid Strep A Screen: NEGATIVE

## 2023-09-23 MED ORDER — DOXYCYCLINE HYCLATE 100 MG PO TABS: 100.0000 mg | ORAL_TABLET | Freq: Two times a day (BID) | ORAL | 0 refills | Status: DC

## 2023-09-23 MED ORDER — PREDNISONE 20 MG PO TABS
40.0000 mg | ORAL_TABLET | Freq: Every day | ORAL | 0 refills | Status: AC
Start: 2023-09-23 — End: 2023-09-28

## 2023-09-23 MED ORDER — PROMETHAZINE-DM 6.25-15 MG/5ML PO SYRP
5.0000 mL | ORAL_SOLUTION | Freq: Four times a day (QID) | ORAL | 0 refills | Status: DC | PRN
Start: 2023-09-23 — End: 2023-11-16

## 2023-09-23 NOTE — Patient Instructions (Addendum)
Continue to push fluids, practice good hand hygiene, and cover your mouth if you cough.  If you start having fevers, shaking or shortness of breath, seek immediate care.  Wait 2-3 days. If no better, take the doxycycline.   Do not drink alcohol, do any illicit/street drugs, drive or do anything that requires alertness while on this cough medicine.   Let us know if you need anything.

## 2023-09-23 NOTE — Progress Notes (Signed)
CC: URI s/s's  Charlyne Mom here for URI complaints.  Duration: 3 days  Associated symptoms: Fever (101.2 F), sinus congestion, sinus pain, rhinorrhea, ear fullness, sore throat, myalgia, and coughing Denies: itchy watery eyes, ear pain, ear drainage, wheezing, shortness of breath, and N/V/D Treatment to date: allergy meds, ibuprofen, Delsym Sick contacts: No  Past Medical History:  Diagnosis Date   Diabetes mellitus without complication (HCC)    Hypertension    NAFLD (nonalcoholic fatty liver disease)    Thyroid disease     Objective BP 126/78 (BP Location: Left Arm, Patient Position: Sitting, Cuff Size: Normal)   Pulse 71   Temp 98 F (36.7 C) (Oral)   Resp 16   Ht 5\' 2"  (1.575 m)   Wt 160 lb 9.6 oz (72.8 kg)   SpO2 97%   BMI 29.37 kg/m  General: Awake, alert, appears stated age HEENT: AT, [place], ears patent b/l and TM neg on the right, serous fluid on the left without significant bulging or erythema, nares patent w/o discharge, pharynx pink and without exudates, MMM, +frontal sinus ttp b/l Neck: No masses or asymmetry Heart: RRR Lungs: CTAB, no accessory muscle use Psych: Age appropriate judgment and insight, normal mood and affect  Influenza A - Plan: predniSONE (DELTASONE) 20 MG tablet, promethazine-dextromethorphan (PROMETHAZINE-DM) 6.25-15 MG/5ML syrup, POCT rapid strep A, POCT Influenza A/B, POC COVID-19  Rapid flu positive for influenza A.  5 d pred burst 40 mg/d. Cough syrup as above.  Warnings about drowsiness verbalized and written down.  She is outside of the window for appropriate use of Tamiflu.  Continue to push fluids, practice good hand hygiene, cover mouth when coughing. F/u prn. If starting to experience fevers, shaking, or shortness of breath, seek immediate care. Pt voiced understanding and agreement to the plan.  Jilda Roche [place], DO 09/23/23 9:07 AM

## 2023-10-28 ENCOUNTER — Other Ambulatory Visit: Payer: Self-pay

## 2023-10-28 ENCOUNTER — Telehealth: Payer: Self-pay | Admitting: Family Medicine

## 2023-10-28 DIAGNOSIS — H6993 Unspecified Eustachian tube disorder, bilateral: Secondary | ICD-10-CM

## 2023-10-28 NOTE — Telephone Encounter (Signed)
 Can we verify the reason? I thought she meant she had an appt. OK to refer afterwards.

## 2023-10-28 NOTE — Telephone Encounter (Signed)
 Pt stated at front desk that pcp wants to be reminded to put in ent refferal for her. Pt asked to have a telephone note sent back.

## 2023-10-28 NOTE — Telephone Encounter (Signed)
 Called pt and she missed her appt back in 2023. Pt ask for referral to go  [facility]  [place], [place]. New referral has been sent.

## 2023-10-29 ENCOUNTER — Encounter (INDEPENDENT_AMBULATORY_CARE_PROVIDER_SITE_OTHER): Payer: Self-pay | Admitting: Otolaryngology

## 2023-11-04 DIAGNOSIS — H2513 Age-related nuclear cataract, bilateral: Secondary | ICD-10-CM | POA: Diagnosis not present

## 2023-11-04 DIAGNOSIS — H40033 Anatomical narrow angle, bilateral: Secondary | ICD-10-CM | POA: Diagnosis not present

## 2023-11-10 ENCOUNTER — Other Ambulatory Visit: Payer: Self-pay

## 2023-11-10 NOTE — Telephone Encounter (Signed)
Called pt was advised to check with pharmacy its not to early for refill and if not we will send it in to pharmacy. Pt stated she will call and request refill for 90 day.

## 2023-11-10 NOTE — Telephone Encounter (Signed)
Copied from CRM [id number]. Topic: Clinical - Medical Advice >> Nov 10, 2023 12:51 PM Elizebeth Brooking wrote: Reason for CRM: Patient would like nurse to call her back [place] and has a couple of questions to ask

## 2023-11-12 ENCOUNTER — Other Ambulatory Visit: Payer: Self-pay | Admitting: Family Medicine

## 2023-11-16 ENCOUNTER — Ambulatory Visit (INDEPENDENT_AMBULATORY_CARE_PROVIDER_SITE_OTHER): Payer: BC Managed Care – PPO | Admitting: Family Medicine

## 2023-11-16 ENCOUNTER — Encounter: Payer: Self-pay | Admitting: Family Medicine

## 2023-11-16 VITALS — BP 118/76 | HR 82 | Temp 98.0°F | Resp 16 | Ht 62.0 in | Wt 158.0 lb

## 2023-11-16 DIAGNOSIS — R202 Paresthesia of skin: Secondary | ICD-10-CM

## 2023-11-16 DIAGNOSIS — S46819A Strain of other muscles, fascia and tendons at shoulder and upper arm level, unspecified arm, initial encounter: Secondary | ICD-10-CM | POA: Diagnosis not present

## 2023-11-16 DIAGNOSIS — E669 Obesity, unspecified: Secondary | ICD-10-CM | POA: Diagnosis not present

## 2023-11-16 DIAGNOSIS — E1169 Type 2 diabetes mellitus with other specified complication: Secondary | ICD-10-CM | POA: Diagnosis not present

## 2023-11-16 LAB — COMPREHENSIVE METABOLIC PANEL
ALT: 17 U/L (ref 0–35)
AST: 24 U/L (ref 0–37)
Albumin: 4.3 g/dL (ref 3.5–5.2)
Alkaline Phosphatase: 50 U/L (ref 39–117)
BUN: 13 mg/dL (ref 6–23)
CO2: 31 meq/L (ref 19–32)
Calcium: 9.4 mg/dL (ref 8.4–10.5)
Chloride: 100 meq/L (ref 96–112)
Creatinine, Ser: 0.85 mg/dL (ref 0.40–1.20)
GFR: 74.78 mL/min (ref >60.00)
Glucose, Bld: 68 mg/dL — ABNORMAL LOW (ref 70–99)
Potassium: 4.2 meq/L (ref 3.5–5.1)
Sodium: 136 meq/L (ref 135–145)
Total Bilirubin: 0.9 mg/dL (ref 0.2–1.2)
Total Protein: 7.1 g/dL (ref 6.0–8.3)

## 2023-11-16 LAB — CBC
HCT: 39.9 % (ref 36.0–46.0)
Hemoglobin: 13.6 g/dL (ref 12.0–15.0)
MCHC: 34 g/dL (ref 30.0–36.0)
MCV: 86.8 fL (ref 78.0–100.0)
Platelets: 193 10*3/uL (ref 150.0–400.0)
RBC: 4.6 Mil/uL (ref 3.87–5.11)
RDW: 13.5 % (ref 11.5–15.5)
WBC: 6.3 10*3/uL (ref 4.0–10.5)

## 2023-11-16 LAB — VITAMIN B12: Vitamin B-12: >1537 pg/mL — ABNORMAL HIGH (ref 211–911)

## 2023-11-16 LAB — MAGNESIUM: Magnesium: 1.8 mg/dL (ref 1.5–2.5)

## 2023-11-16 LAB — VITAMIN D 25 HYDROXY (VIT D DEFICIENCY, FRACTURES): VITD: 60.28 ng/mL (ref 30.00–100.00)

## 2023-11-16 MED ORDER — MELOXICAM 15 MG PO TABS
15.0000 mg | ORAL_TABLET | Freq: Every day | ORAL | 0 refills | Status: DC
Start: 2023-11-16 — End: 2023-12-03

## 2023-11-16 MED ORDER — TIRZEPATIDE 10 MG/0.5ML ~~LOC~~ SOAJ: 10.0000 mg | SUBCUTANEOUS | 1 refills | Status: DC

## 2023-11-16 MED ORDER — PREGABALIN 100 MG PO CAPS: 100.0000 mg | ORAL_CAPSULE | Freq: Two times a day (BID) | ORAL | Status: DC

## 2023-11-16 NOTE — Patient Instructions (Signed)
Heat (pad or rice pillow in microwave) over affected area, 10-15 minutes twice daily.   Ice/cold pack over area for 10-15 min twice daily.  OK to take Tylenol 1000 mg (2 extra strength tabs) or 975 mg (3 regular strength tabs) every 6 hours as needed.  Let us know if you need anything.

## 2023-11-16 NOTE — Progress Notes (Signed)
Musculoskeletal Exam  Patient: Lindsey Owen DOB: Jul 19, 1966  DOS: 11/16/2023  SUBJECTIVE:  Chief Complaint:   Chief Complaint  Patient presents with   Back Pain    Back pain    Lindsey Owen is a 58 y.o.  female for evaluation and treatment of back pain.   Onset:  5 days ago.  Fell after getting up and strained upper back/shoulder area. Had numbness/tingling in entire body when she fell. BP was low 86/60, BS 90.  Location: points to traps b/l Character:  aching  Progression of issue:  is unchanged Associated symptoms: tight No bruising, redness, swelling, decreased ROM.  Denies bowel/bladder incontinence or weakness Treatment: to date has been ice, OTC NSAIDS, stretches/exercises, and muscle relaxers.   Neurovascular symptoms: no  Past Medical History:  Diagnosis Date   Diabetes mellitus without complication (HCC)    Hypertension    NAFLD (nonalcoholic fatty liver disease)    Thyroid disease     Objective:  VITAL SIGNS: BP 118/76   Pulse 82   Temp 98 F (36.7 C) (Oral)   Resp 16   Ht 5\' 2"  (1.575 m)   Wt 158 lb (71.7 kg)   SpO2 98%   BMI 28.90 kg/m  Constitutional: Well formed, well developed. No acute distress. HENT: Normocephalic, atraumatic.  Thorax & Lungs:  No accessory muscle use Musculoskeletal: low back.   Tenderness to palpation: yes over b/l traps Deformity: no Ecchymosis: no Spurlings: Neg Neurologic: Normal sensory function. No focal deficits noted. DTR's equal and symmetric in UE's. No clonus. Psychiatric: Normal mood. Age appropriate judgment and insight. Alert & oriented x 3.    Assessment:  Strain of trapezius muscle, unspecified laterality, initial encounter - Plan: meloxicam (MOBIC) 15 MG tablet  Type 2 diabetes mellitus with obesity (HCC)  Paresthesias - Plan: CBC, Comprehensive metabolic panel, VITAMIN D 25 Hydroxy (Vit-D Deficiency, Fractures), Magnesium, B12  Plan: Stretches/exercises, heat, ice, Tylenol, NSAIDs. BP's have  normalized.  Ck above labs. Could have been related to BP drop.  F/u prn. The patient voiced understanding and agreement to the plan.   Jilda Roche [place], DO 11/16/23  12:02 PM

## 2023-11-17 ENCOUNTER — Encounter: Payer: Self-pay | Admitting: Family Medicine

## 2023-12-03 ENCOUNTER — Other Ambulatory Visit: Payer: Self-pay | Admitting: Family Medicine

## 2023-12-03 DIAGNOSIS — S46819A Strain of other muscles, fascia and tendons at shoulder and upper arm level, unspecified arm, initial encounter: Secondary | ICD-10-CM

## 2023-12-04 ENCOUNTER — Ambulatory Visit: Payer: BC Managed Care – PPO | Admitting: Family Medicine

## 2023-12-17 ENCOUNTER — Telehealth (INDEPENDENT_AMBULATORY_CARE_PROVIDER_SITE_OTHER): Payer: Self-pay | Admitting: Otolaryngology

## 2023-12-17 NOTE — Telephone Encounter (Signed)
 Confirmed appt & location [id number] afm

## 2023-12-18 ENCOUNTER — Ambulatory Visit (INDEPENDENT_AMBULATORY_CARE_PROVIDER_SITE_OTHER): Payer: BC Managed Care – PPO | Admitting: Audiology

## 2023-12-18 ENCOUNTER — Encounter (INDEPENDENT_AMBULATORY_CARE_PROVIDER_SITE_OTHER): Payer: Self-pay

## 2023-12-18 ENCOUNTER — Ambulatory Visit (INDEPENDENT_AMBULATORY_CARE_PROVIDER_SITE_OTHER): Payer: BC Managed Care – PPO | Admitting: Otolaryngology

## 2023-12-18 VITALS — BP 163/93 | HR 62 | Ht 63.0 in | Wt 170.0 lb

## 2023-12-18 DIAGNOSIS — H6983 Other specified disorders of Eustachian tube, bilateral: Secondary | ICD-10-CM

## 2023-12-18 DIAGNOSIS — H903 Sensorineural hearing loss, bilateral: Secondary | ICD-10-CM | POA: Diagnosis not present

## 2023-12-18 DIAGNOSIS — J343 Hypertrophy of nasal turbinates: Secondary | ICD-10-CM | POA: Diagnosis not present

## 2023-12-18 DIAGNOSIS — R0981 Nasal congestion: Secondary | ICD-10-CM | POA: Diagnosis not present

## 2023-12-18 DIAGNOSIS — J310 Chronic rhinitis: Secondary | ICD-10-CM

## 2023-12-18 NOTE — Progress Notes (Signed)
  [address] [place], [place] [address] [phone]  Audiological Evaluation    Name: Lindsey Owen     DOB:   December 10, 1965      MRN:   [id number]                                                                                     Service Date: 12/18/2023     Accompanied by: unaccompanied    Patient comes today after Dr. Suszanne Conners, ENT sent a referral for a hearing evaluation due to concerns with ear fullness.   Symptoms Yes Details  Hearing loss  []    Tinnitus  []    Ear pain/ infections/pressure  [x]  Both ears  Balance problems  []    Noise exposure history  []    Previous ear surgeries  []    Family history of hearing loss  []    Amplification  []    Other  []      Otoscopy: Right ear: Clear external ear canals and notable landmarks visualized on the tympanic membrane. Left ear:  Clear external ear canals and notable landmarks visualized on the tympanic membrane.  Tympanometry: Right ear: Type A- Normal external ear canal volume with normal middle ear pressure and tympanic membrane compliance Left ear: Type A- Normal external ear canal volume with normal middle ear pressure and tympanic membrane compliance  Pure tone Audiometry: Right ear- Normal hearing from [phone] Hz, then moderate to moderately severe sensorineural hearing loss from 6000 Hz - 8000 Hz. Left ear-   Normal hearing from [phone] Hz, then moderate to moderately severe sensorineural hearing loss from 6000 Hz - 8000 Hz.  Speech Audiometry: Right ear- Speech Reception Threshold (SRT) was obtained at 20 dBHL. Left ear-Speech Reception Threshold (SRT) was obtained at 15 dBHL.   Word Recognition Score Tested using NU-6 (MLV) Right ear: 100% was obtained at a presentation level of 65 dBHL with contralateral masking which is deemed as  excellent. Left ear: 96% was obtained at a presentation level of 65 dBHL with contralateral masking which is deemed as  excellent.   The hearing test results were completed  under headphones and results are deemed to be of good reliability. Test technique:  conventional     Recommendations: Follow up with ENT as scheduled for today. Return for a hearing evaluation at least in 2-3 years, before if concerns with hearing changes arise or per MD recommendation.   Jaylaa Gallion MARIE LEROUX-MARTINEZ, AUD

## 2023-12-20 DIAGNOSIS — H6983 Other specified disorders of Eustachian tube, bilateral: Secondary | ICD-10-CM | POA: Insufficient documentation

## 2023-12-20 DIAGNOSIS — J343 Hypertrophy of nasal turbinates: Secondary | ICD-10-CM | POA: Insufficient documentation

## 2023-12-20 DIAGNOSIS — H903 Sensorineural hearing loss, bilateral: Secondary | ICD-10-CM | POA: Insufficient documentation

## 2023-12-20 DIAGNOSIS — J310 Chronic rhinitis: Secondary | ICD-10-CM | POA: Insufficient documentation

## 2023-12-20 NOTE — Addendum Note (Signed)
 Addended byNewman Pies on: 12/20/2023 07:56 PM   Modules accepted: Orders

## 2023-12-20 NOTE — Progress Notes (Signed)
 Patient ID: Lindsey Owen, female   DOB: December 24, 1965, 58 y.o.   MRN: [id number]  CC: Clogging sensation in ears, muffled hearing, chronic nasal congestion, recurrent sinusitis  HPI:  Lindsey Owen is a 58 y.o. female who presents today complaining of clogging sensation in her ears for the past 2 years.  She also complains of muffled hearing, chronic nasal congestion, and recurrent sinusitis.  Her ear symptoms are worse on the left side.  Her symptoms started after her COVID infection.  She has been using Flonase nasal spray daily for the past 2 years.  She is able to perform the Valsalva exercise to auto insufflate her middle ear spaces.  She denies any history of otitis media or otitis externa.  She has no previous ENT surgery.  Past Medical History:  Diagnosis Date   Diabetes mellitus without complication (HCC)    Hypertension    NAFLD (nonalcoholic fatty liver disease)    Thyroid disease     Past Surgical History:  Procedure Laterality Date   CERVICAL SPINE SURGERY     CESAREAN SECTION  10/20/1998   UTERINE FIBROID SURGERY      Family History  Problem Relation Age of Onset   Kidney disease Mother        Kidney Failure   Heart disease Father        MI    Social History:  reports that she has never smoked. She has never used smokeless tobacco. She reports that she does not drink alcohol and does not use drugs.  Allergies: No Known Allergies  Prior to Admission medications   Medication Sig Start Date End Date Taking? Authorizing Provider  clobetasol (TEMOVATE) 0.05 % external solution Apply 1 application topically 2 (two) times daily. 03/07/22  Yes Sharlene Dory, DO  levothyroxine (SYNTHROID) 125 MCG tablet Take 1 tablet (125 mcg total) by mouth daily before breakfast. 09/01/23  Yes Wendling, Jilda Roche, DO  lisinopril-hydrochlorothiazide (ZESTORETIC) 20-12.5 MG tablet Take 1 tablet by mouth daily. 09/01/23  Yes Sharlene Dory, DO  meloxicam (MOBIC) 15 MG  tablet Take 1 tablet by mouth once daily 12/03/23  Yes Sharlene Dory, DO  metFORMIN (GLUCOPHAGE-XR) 500 MG 24 hr tablet Take 1 tablet (500 mg total) by mouth 2 (two) times daily with a meal. 09/01/23  Yes Wendling, Jilda Roche, DO  rosuvastatin (CRESTOR) 5 MG tablet Take 1 tablet (5 mg total) by mouth daily. 09/01/23  Yes Wendling, Jilda Roche, DO  tirzepatide [facility]) 10 MG/0.5ML Pen Inject 10 mg into the skin once a week. 11/16/23  Yes Wendling, Jilda Roche, DO  tiZANidine (ZANAFLEX) 4 MG tablet TAKE 1 TABLET BY MOUTH EVERY 6 HOURS AS NEEDED FOR MUSCLE SPASM 11/12/23  Yes Sharlene Dory, DO  pregabalin (LYRICA) 100 MG capsule Take 1 capsule (100 mg total) by mouth 2 (two) times daily. 11/16/23   Sharlene Dory, DO    Blood pressure (!) 163/93, pulse 62, height 5\' 3"  (1.6 m), weight 170 lb (77.1 kg), SpO2 97%. Exam: General: Communicates without difficulty, well nourished, no acute distress. Head: Normocephalic, no evidence injury, no tenderness, facial buttresses intact without stepoff. Face/sinus: No tenderness to palpation and percussion. Facial movement is normal and symmetric. Eyes: PERRL, EOMI. No scleral icterus, conjunctivae clear. Neuro: CN II exam reveals vision grossly intact.  No nystagmus at any point of gaze. Ears: Auricles well formed without lesions.  Ear canals are intact without mass or lesion.  No erythema or edema is appreciated.  The TMs  are intact without fluid. Nose: External evaluation reveals normal support and skin without lesions.  Dorsum is intact.  Anterior rhinoscopy reveals congested mucosa over anterior aspect of inferior turbinates and intact septum.  No purulence noted. Oral:  Oral cavity and oropharynx are intact, symmetric, without erythema or edema.  Mucosa is moist without lesions. Neck: Full range of motion without pain.  There is no significant lymphadenopathy.  No masses palpable.  Thyroid bed within normal limits to palpation.   Parotid glands and submandibular glands equal bilaterally without mass.  Trachea is midline. Neuro:  CN 2-12 grossly intact.   Procedure:  Flexible Nasal Endoscopy: Description: Risks, benefits, and alternatives of flexible endoscopy were explained to the patient.  Specific mention was made of the risk of throat numbness with difficulty swallowing, possible bleeding from the nose and mouth, and pain from the procedure.  The patient gave oral consent to proceed.  The flexible scope was inserted into the right nasal cavity.  Endoscopy of the interior nasal cavity, superior, inferior, and middle meatus was performed. The sphenoid-ethmoid recess was examined. Edematous mucosa was noted.  No polyp, mass, or lesion was appreciated.  Olfactory cleft was clear.  Nasopharynx was clear.  Turbinates were hypertrophied but without mass.  The procedure was repeated on the contralateral side with similar findings.  The patient tolerated the procedure well.   Her hearing test shows bilateral symmetric high-frequency sensorineural hearing loss.  Assessment: 1.  The patient's history is consistent with bilateral eustachian tube dysfunction. 2.  Her ear canals, tympanic membranes, and middle ear spaces are normal.  No middle ear effusion or infection is noted. 3.  Bilateral symmetric high-frequency sensorineural hearing loss, likely secondary to routine presbycusis. 4.  Chronic rhinitis with nasal mucosal congestion and bilateral inferior turbinate hypertrophy.  No acute infection is noted.  Plan: 1.  The physical exam and nasal endoscopy findings are reviewed with the patient.  The hearing test results are also reviewed. 2.  Flonase nasal spray 2 sprays each nostril daily. 3.  Valsalva exercise multiple times a day. 4.  The patient will return for reevaluation if her symptoms worsen.  Lindsey Owen 12/20/2023, 7:49 PM

## 2023-12-25 ENCOUNTER — Other Ambulatory Visit: Payer: Self-pay | Admitting: Family Medicine

## 2023-12-25 DIAGNOSIS — S46819A Strain of other muscles, fascia and tendons at shoulder and upper arm level, unspecified arm, initial encounter: Secondary | ICD-10-CM

## 2024-01-12 ENCOUNTER — Other Ambulatory Visit: Payer: Self-pay | Admitting: Family Medicine

## 2024-01-12 DIAGNOSIS — S46819A Strain of other muscles, fascia and tendons at shoulder and upper arm level, unspecified arm, initial encounter: Secondary | ICD-10-CM

## 2024-02-02 ENCOUNTER — Other Ambulatory Visit: Payer: Self-pay

## 2024-02-02 ENCOUNTER — Encounter: Payer: Self-pay | Admitting: Family Medicine

## 2024-02-02 ENCOUNTER — Other Ambulatory Visit: Payer: Self-pay | Admitting: Family Medicine

## 2024-02-02 MED ORDER — PREGABALIN 100 MG PO CAPS
100.0000 mg | ORAL_CAPSULE | Freq: Two times a day (BID) | ORAL | 1 refills | Status: AC
  Filled 2024-02-02 – 2024-02-03 (×2): qty 180, 90d supply, fill #0
  Filled 2024-08-19: qty 180, 90d supply, fill #1

## 2024-02-02 NOTE — Telephone Encounter (Signed)
 Requesting:  Lyrica 100mg  Contract: None UDS: None Last Visit: 11/16/23 Next Visit: None Last Refill: 09/01/23 #90 and 1rf (75mg  dose)   Please Advise

## 2024-02-03 ENCOUNTER — Other Ambulatory Visit: Payer: Self-pay | Admitting: Family Medicine

## 2024-02-03 ENCOUNTER — Other Ambulatory Visit: Payer: Self-pay

## 2024-02-03 ENCOUNTER — Other Ambulatory Visit (HOSPITAL_BASED_OUTPATIENT_CLINIC_OR_DEPARTMENT_OTHER): Payer: Self-pay

## 2024-02-03 DIAGNOSIS — S46819A Strain of other muscles, fascia and tendons at shoulder and upper arm level, unspecified arm, initial encounter: Secondary | ICD-10-CM

## 2024-02-24 ENCOUNTER — Other Ambulatory Visit: Payer: Self-pay | Admitting: Family Medicine

## 2024-02-24 DIAGNOSIS — S46819A Strain of other muscles, fascia and tendons at shoulder and upper arm level, unspecified arm, initial encounter: Secondary | ICD-10-CM

## 2024-03-10 ENCOUNTER — Other Ambulatory Visit: Payer: Self-pay | Admitting: Family Medicine

## 2024-03-11 ENCOUNTER — Other Ambulatory Visit: Payer: Self-pay | Admitting: Family Medicine

## 2024-03-11 DIAGNOSIS — S46819A Strain of other muscles, fascia and tendons at shoulder and upper arm level, unspecified arm, initial encounter: Secondary | ICD-10-CM

## 2024-03-28 ENCOUNTER — Encounter: Payer: Self-pay | Admitting: Family Medicine

## 2024-03-28 ENCOUNTER — Ambulatory Visit (INDEPENDENT_AMBULATORY_CARE_PROVIDER_SITE_OTHER): Admitting: Family Medicine

## 2024-03-28 VITALS — BP 130/80 | HR 73 | Temp 98.0°F | Resp 16 | Ht 63.0 in | Wt 166.2 lb

## 2024-03-28 DIAGNOSIS — M79641 Pain in right hand: Secondary | ICD-10-CM

## 2024-03-28 DIAGNOSIS — Z96653 Presence of artificial knee joint, bilateral: Secondary | ICD-10-CM

## 2024-03-28 DIAGNOSIS — M25562 Pain in left knee: Secondary | ICD-10-CM | POA: Diagnosis not present

## 2024-03-28 DIAGNOSIS — M79642 Pain in left hand: Secondary | ICD-10-CM

## 2024-03-28 DIAGNOSIS — M25561 Pain in right knee: Secondary | ICD-10-CM | POA: Diagnosis not present

## 2024-03-28 DIAGNOSIS — G8928 Other chronic postprocedural pain: Secondary | ICD-10-CM

## 2024-03-28 NOTE — Patient Instructions (Signed)
 Give us  2-3 business days to get the results of your labs back.   Consider [facility] chiropractic.   Heat (pad or rice pillow in microwave) over affected area, 10-15 minutes twice daily.   Ice/cold pack over area for 10-15 min twice daily.  OK to take Tylenol  1000 mg (2 extra strength tabs) or 975 mg (3 regular strength tabs) every 6 hours as needed.  Let us  know if you need anything.  Semimembranosus Tendinitis Rehab  It is normal to feel mild stretching, pulling, tightness, or discomfort as you do these exercises, but you should stop right away if you feel sudden pain or your pain gets worse.  Stretching and range of motion exercises These exercises warm up your muscles and joints and improve the movement and flexibility of your thigh. These exercises also help to relieve pain, numbness, and tingling. Exercise A: Hamstring stretch, supine    Lie on your back. Loop a belt or towel across the ball of your left / right foot The ball of your foot is on the walking surface, right under your toes. Straighten your left / right knee and slowly pull on the belt to raise your leg. Stop when you feel a gentle stretch behind your left / right knee or thigh. Do not allow the knee to bend. Keep your other leg flat on the floor. Hold this position for 30 seconds. Repeat 2 times. Complete this exercise 3 times a week. Strengthening exercises These exercises build strength and endurance in your thigh. Endurance is the ability to use your muscles for a long time, even after they get tired. Exercise B: Straight leg raises (hip extensors) Lie on your belly on a bed or a firm surface with a pillow under your hips. Bend your left / right knee so your foot is straight up in the air. Squeeze your buttock muscles and lift your left / right thigh off the bed. Do not let your back arch. Hold this position for 3 seconds. Slowly return to the starting position. Let your muscles relax completely before you  do another repetition. Repeat 2 times. Complete this exercise 3 times a week. Exercise C: Bridge (hip extensors)     Lie on your back on a firm surface with your knees bent and your feet flat on the floor. Tighten your buttocks muscles and lift your bottom off the floor until your trunk is level with your thighs. You should feel the muscles working in your buttocks and the back of your thighs. If you do not feel these muscles, slide your feet 1-2 inches (2.5-5 cm) farther away from your buttocks. Do not arch your back. Hold this position for 3 seconds. Slowly lower your hips to the starting position. Let your buttocks muscles relax completely between repetitions. If this exercise is too easy, try doing it with your arms crossed over your chest. Repeat 2 times. Complete this exercise 3 times a week. Exercise D: Hamstring eccentric, prone Lie on your belly on a bed or on the floor. Start with your legs straight. Cross your legs at the ankles with your left / right leg on top. Using your bottom leg to do the work, bend both knees. Using just your left / right leg alone, slowly lower your leg back down toward the bed. Add a 5 lb weight as told by your health care provider. Let your muscles relax completely between repetitions. Repeat 2 times. Complete this exercise 3 times a week. Exercise E: Squats Stand  in front of a table, with your feet and knees pointing straight ahead. You may rest your hands on the table for balance but not for support. Slowly bend your knees and lower your hips like you are going to sit in a chair. Keep your thighs straight or pointed slightly outward. Keep your weight over your heels, not over your toes. Keep your lower legs upright so they are parallel with the table legs. Do not let your hips go lower than your knees. Stop when your knees are bent to the shape of an upside-down letter "L" (90 degree angle). Do not bend lower than told by your health care  provider. If your knee pain increases, do not bend as low. Hold the squat position 1-2 seconds. Slowly push with your legs to return to standing. Do not use your hands to pull yourself to standing. Repeat 2 times. Complete this exercise 3 times a week. Make sure you discuss any questions you have with your health care provider. Document Released: 10/06/2005 Document Revised: 06/12/2016 Document Reviewed: 07/10/2015 Elsevier Interactive Patient Education  2018 [employer].  Stretching and range of motion exercises These exercises warm up your muscles and joints and improve the movement and flexibility of your lower leg. These exercises also help to relieve pain and stiffness.  Exercise A: Gastrocnemius stretch Sit with your left / right leg extended. Loop a belt or towel around the ball of your left / right foot. The ball of your foot is on the walking surface, right under your toes. Hold both ends of the belt or towel. Keep your left / right ankle and foot relaxed and keep your knee straight while you use the belt or towel to pull your foot and ankle toward you. Stop at the first point of resistance. Hold this position for 30 seconds. Repeat 2 times. Complete this exercise 3 times per week.  Exercise B: Ankle alphabet Sit with your left / right leg supported at the lower leg. Do not rest your foot on anything. Make sure your foot has room to move freely. Think of your left / right foot as a paintbrush, and move your foot to trace each letter of the alphabet in the air. Keep your hip and knee still while you trace. Trace every letter from A to Z. Repeat 2 times. Complete this exercise 3 times per week.  Strengthening exercises These exercises build strength and endurance in your lower leg. Endurance is the ability to use your muscles for a long time, even after they get tired.  Exercise C: Plantar flexors with band Sit with your left / right leg extended. Loop a rubber exercise band  or tube around the ball of your left / right foot. The ball of your foot is on the walking surface, right under your toes. While holding both ends of the band or tube, slowly point your toes downward, pushing them away from you. Hold this position for 3 seconds. Slowly return your foot to the starting position and repeat for a total of 10 repetitions. Repeat 2 times. Complete this exercise 3 times per week.  Exercise D: Plantar flexors, standing Stand with your feet shoulder-width apart. Place your hands on a wall or table to steady yourself as needed, but try not to use it very much for support. Rise up on your toes. If this exercise is too easy, try these options: Shift your weight toward your left / right leg until you feel challenged. If told by your  health care provider, stand on your left / right foot only. Hold this position for 3 seconds. Repeat for a total of 10 repetitions. Repeat 2 times. Complete this exercise 3 times per week.  Exercise E: Plantar flexors, eccentric Stand on the balls of your feet on the edge of a step. The ball of your foot is on the walking surface, right under your toes. Place your hands on a wall or railing for balance as needed, but try not to lean on it for support. Rise up on your toes, using both legs to help. Slowly shift all of your weight to your left / right foot and lift your other foot off the step. Slowly lower your left / right heel so it drops below the level of the step. You will feel a slight stretch in your left / right calf. Put your other foot back onto the step. Repeat 2 times. Complete this exercise 3 times per week. This information is not intended to replace advice given to you by your health care provider. Make sure you discuss any questions you have with your health care provider.

## 2024-03-28 NOTE — Progress Notes (Signed)
 Musculoskeletal Exam  Patient: Lindsey Owen DOB: [birth year]  DOS: 03/28/2024  SUBJECTIVE:  Chief Complaint:   Chief Complaint  Patient presents with   Arthritis    Arthritis     Lindsey Owen January is a 58 y.o.  female for evaluation and treatment of b/l hand/knee pain.   Onset:  severalmonths ago. No inj or change in activity.  Location: all of hands and  Character:  aching  Progression of issue:  has worsened Associated symptoms: feels tightness Treatment: to date has been ice, OTC NSAIDS, prescription NSAIDS, acetaminophen , and muscle relaxers.   Neurovascular symptoms: no  Past Medical History:  Diagnosis Date   Diabetes mellitus without complication (HCC)    Hypertension    NAFLD (nonalcoholic fatty liver disease)    Thyroid  disease     Objective: VITAL SIGNS: BP 130/80 (BP Location: Left Arm, Patient Position: Sitting)   Pulse 73   Temp 98 F (36.7 C) (Oral)   Resp 16   Ht 5\' 3"  (1.6 m)   Wt 166 lb 3.2 oz (75.4 kg)   SpO2 97%   BMI 29.44 kg/m  Constitutional: Well formed, well developed. No acute distress. Thorax & Lungs: No accessory muscle use Musculoskeletal: hands.   Normal active range of motion: yes.   Normal passive range of motion: yes Tenderness to palpation: yes diffusely over MCPs, PIPs, and DIPs bilaterally Deformity: no Ecchymosis: no Knees: Normal active and passive range of motion Very tight hamstring and calves bilaterally Mild TTP over popliteal region bilaterally No deformity, ecchymosis, edema, effusion Tests positive: None Tests negative: Lachman's, varus/valgus stress Neurologic: Normal sensory function. No focal deficits noted. DTR's equal and symmetric in LE's. No clonus. Psychiatric: Normal mood. Age appropriate judgment and insight. Alert & oriented x 3.    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  Rule out autoimmune etiologies.  Ice, Tylenol .  Check imaging. Chronic, worsening.  Check imaging.  Stretches/exercises, heat, ice, Tylenol .  Consider injections versus therapy.  Continue meloxicam  15 mg daily as needed. F/u in 1 month. The patient voiced understanding and agreement to the plan.   Lindsey Owen [place], DO 03/28/24  4:50 PM

## 2024-03-29 ENCOUNTER — Ambulatory Visit

## 2024-03-29 ENCOUNTER — Other Ambulatory Visit: Payer: Self-pay | Admitting: Family Medicine

## 2024-03-29 DIAGNOSIS — M25562 Pain in left knee: Secondary | ICD-10-CM

## 2024-03-29 DIAGNOSIS — M1711 Unilateral primary osteoarthritis, right knee: Secondary | ICD-10-CM

## 2024-03-29 DIAGNOSIS — M1712 Unilateral primary osteoarthritis, left knee: Secondary | ICD-10-CM

## 2024-03-29 DIAGNOSIS — M25561 Pain in right knee: Secondary | ICD-10-CM | POA: Diagnosis not present

## 2024-03-29 DIAGNOSIS — M79641 Pain in right hand: Secondary | ICD-10-CM

## 2024-03-29 DIAGNOSIS — M19041 Primary osteoarthritis, right hand: Secondary | ICD-10-CM

## 2024-03-29 DIAGNOSIS — M79642 Pain in left hand: Secondary | ICD-10-CM

## 2024-03-29 DIAGNOSIS — M19042 Primary osteoarthritis, left hand: Secondary | ICD-10-CM | POA: Diagnosis not present

## 2024-03-29 LAB — URIC ACID: Uric Acid, Serum: 4.1 mg/dL (ref 2.5–7.0)

## 2024-03-30 ENCOUNTER — Ambulatory Visit: Payer: Self-pay | Admitting: Family Medicine

## 2024-03-30 DIAGNOSIS — M25569 Pain in unspecified knee: Secondary | ICD-10-CM

## 2024-03-30 LAB — ANA,IFA RA DIAG PNL W/RFLX TIT/PATN
Anti Nuclear Antibody (ANA): NEGATIVE
Cyclic Citrullin Peptide Ab: <16 U
Rheumatoid fact SerPl-aCnc: 13 [IU]/mL (ref <14)

## 2024-03-30 LAB — ANTI-SMITH ANTIBODY: ENA SM Ab Ser-aCnc: <1 AI

## 2024-03-30 LAB — ANTI-DNA ANTIBODY, DOUBLE-STRANDED: ds DNA Ab: <1 [IU]/mL

## 2024-03-31 ENCOUNTER — Other Ambulatory Visit: Payer: Self-pay | Admitting: Family Medicine

## 2024-03-31 DIAGNOSIS — S46819A Strain of other muscles, fascia and tendons at shoulder and upper arm level, unspecified arm, initial encounter: Secondary | ICD-10-CM

## 2024-04-14 DIAGNOSIS — M9904 Segmental and somatic dysfunction of sacral region: Secondary | ICD-10-CM | POA: Diagnosis not present

## 2024-04-14 DIAGNOSIS — M9902 Segmental and somatic dysfunction of thoracic region: Secondary | ICD-10-CM | POA: Diagnosis not present

## 2024-04-14 DIAGNOSIS — M5416 Radiculopathy, lumbar region: Secondary | ICD-10-CM | POA: Diagnosis not present

## 2024-04-14 DIAGNOSIS — M9903 Segmental and somatic dysfunction of lumbar region: Secondary | ICD-10-CM | POA: Diagnosis not present

## 2024-04-15 DIAGNOSIS — M5416 Radiculopathy, lumbar region: Secondary | ICD-10-CM | POA: Diagnosis not present

## 2024-04-15 DIAGNOSIS — M9902 Segmental and somatic dysfunction of thoracic region: Secondary | ICD-10-CM | POA: Diagnosis not present

## 2024-04-15 DIAGNOSIS — M9904 Segmental and somatic dysfunction of sacral region: Secondary | ICD-10-CM | POA: Diagnosis not present

## 2024-04-15 DIAGNOSIS — M9903 Segmental and somatic dysfunction of lumbar region: Secondary | ICD-10-CM | POA: Diagnosis not present

## 2024-04-19 ENCOUNTER — Ambulatory Visit

## 2024-04-19 DIAGNOSIS — M9903 Segmental and somatic dysfunction of lumbar region: Secondary | ICD-10-CM | POA: Diagnosis not present

## 2024-04-19 DIAGNOSIS — M5416 Radiculopathy, lumbar region: Secondary | ICD-10-CM | POA: Diagnosis not present

## 2024-04-19 DIAGNOSIS — M9904 Segmental and somatic dysfunction of sacral region: Secondary | ICD-10-CM | POA: Diagnosis not present

## 2024-04-19 DIAGNOSIS — M9902 Segmental and somatic dysfunction of thoracic region: Secondary | ICD-10-CM | POA: Diagnosis not present

## 2024-04-20 ENCOUNTER — Ambulatory Visit: Attending: Family Medicine | Admitting: Physical Therapy

## 2024-04-20 ENCOUNTER — Other Ambulatory Visit: Payer: Self-pay

## 2024-04-20 ENCOUNTER — Encounter: Payer: Self-pay | Admitting: Physical Therapy

## 2024-04-20 DIAGNOSIS — M9903 Segmental and somatic dysfunction of lumbar region: Secondary | ICD-10-CM | POA: Diagnosis not present

## 2024-04-20 DIAGNOSIS — R262 Difficulty in walking, not elsewhere classified: Secondary | ICD-10-CM | POA: Diagnosis not present

## 2024-04-20 DIAGNOSIS — G8929 Other chronic pain: Secondary | ICD-10-CM | POA: Diagnosis not present

## 2024-04-20 DIAGNOSIS — M9902 Segmental and somatic dysfunction of thoracic region: Secondary | ICD-10-CM | POA: Diagnosis not present

## 2024-04-20 DIAGNOSIS — M6281 Muscle weakness (generalized): Secondary | ICD-10-CM | POA: Insufficient documentation

## 2024-04-20 DIAGNOSIS — M25561 Pain in right knee: Secondary | ICD-10-CM | POA: Diagnosis not present

## 2024-04-20 DIAGNOSIS — M5416 Radiculopathy, lumbar region: Secondary | ICD-10-CM | POA: Diagnosis not present

## 2024-04-20 DIAGNOSIS — M25562 Pain in left knee: Secondary | ICD-10-CM | POA: Diagnosis not present

## 2024-04-20 DIAGNOSIS — M9904 Segmental and somatic dysfunction of sacral region: Secondary | ICD-10-CM | POA: Diagnosis not present

## 2024-04-20 NOTE — Therapy (Signed)
 OUTPATIENT PHYSICAL THERAPY LOWER EXTREMITY EVALUATION   Patient Name: Lindsey Owen MRN: [id number] DOB:1966-02-19, 58 y.o., female Today's Date: 04/20/2024  END OF SESSION:  PT End of Session - 04/20/24 1104     Visit Number 1    Number of Visits 13    Date for PT Re-Evaluation 06/01/24    Authorization Type BCBS    Authorization Time Period 04/20/24 to 06/01/24    PT Start Time 1102    PT Stop Time 1141    PT Time Calculation (min) 39 min    Activity Tolerance Patient tolerated treatment well    Behavior During Therapy WFL for tasks assessed/performed          Past Medical History:  Diagnosis Date   Diabetes mellitus without complication (HCC)    Hypertension    NAFLD (nonalcoholic fatty liver disease)    Thyroid  disease    Past Surgical History:  Procedure Laterality Date   CERVICAL SPINE SURGERY     CESAREAN SECTION  10/20/1998   UTERINE FIBROID SURGERY     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   NAFLD (nonalcoholic fatty liver disease)    DDD (degenerative disc disease), lumbar 02/27/2020   Low back pain with radiation 09/09/2019   Arthritis 06/01/2019   Type 2 diabetes mellitus with obesity (HCC) 09/10/2018   Essential hypertension 03/19/2015   Hypothyroidism 03/19/2015    PCP: Frann Mabel Mt, DO  REFERRING PROVIDER: Frann Mabel Mt, DO  REFERRING DIAG:  Diagnosis  M25.569 (ICD-10-CM) - Knee pain, unspecified chronicity, unspecified laterality    THERAPY DIAG:  Chronic pain of both knees  Muscle weakness  (generalized)  Difficulty in walking, not elsewhere classified  Rationale for Evaluation and Treatment: Rehabilitation  ONSET DATE: 12 years ago  SUBJECTIVE:   SUBJECTIVE STATEMENT:  I think I have arthritis, they hurt a lot, there's a lot of weakness and stiffness. Sometimes they make noises. Sometimes stairs are difficult. Had PT before in [place] in 2014 or 2015, helped for awhile but then this came back. Having a chiropractor work on my back right now, at first we thought the back was causing the knee pain but then he told me to try PT because it two different issues. Pain R knee radiates up and down leg, less on L. I'm going to [facility] on August 18th, [place] be lots of walking   PERTINENT HISTORY:  See above  PAIN:  Are you having pain? Yes: NPRS scale: R knee 7-8/10, L knee 5/10 Pain location: B knees  Pain description: pulling, radiating, cramping, pressure  Aggravating factors: not sure, maybe weather and being on feet too much  Relieving factors: nothing   PRECAUTIONS: None  RED FLAGS: None   WEIGHT BEARING RESTRICTIONS: No  FALLS:  Has patient fallen in last 6 months? 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  DIAGNOSTIC FINDINGS:   Narrative & Impression CLINICAL DATA:  Bilateral knee pain.  Arthritis.   EXAM: LEFT KNEE - COMPLETE 4+ VIEW   COMPARISON:  None Available.   FINDINGS: Moderate medial current joint space  narrowing and peripheral osteophytosis. The lateral compartment joint space is maintained. Moderate to high-grade superior patellar and trochlear degenerative osteophytes. 14 mm ossicle posteromedial to the distal femoral metaphysis, likely a loose body. This is separate from the posterolateral fabella.   IMPRESSION: 1. 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 2. There is a 14 mm ossicle posteromedial to the distal femoral metaphysis, likely a loose body.  Narrative & Impression CLINICAL DATA:  Bilateral knee pain.  Arthritis.   EXAM: RIGHT KNEE - COMPLETE 4+ VIEW   COMPARISON:  Right knee radiographs 09/01/2022   FINDINGS: Mild to moderate lateral and mild medial compartment joint space narrowing and peripheral osteophytes.   Moderate to high-grade superior patellar degenerative spurring. No acute fracture or dislocation. No definite joint effusion.   8 mm mineralized density posterior to the distal femoral metaphysis seen medially on frontal view, likely a loose body.   IMPRESSION: 1. Mild-to-moderate lateral and mild medial compartment osteoarthritis. 2. Moderate to high-grade superior patellar degenerative spurring. 3. Probable 8 mm loose body posterior to the distal femoral metaphysis. This is similar to prior.  PATIENT SURVEYS:   LEFS 30/80  COGNITION: Overall cognitive status: Within functional limits for tasks assessed       MUSCLE LENGTH:  HS mild limitation B Piriformis OK     LOWER EXTREMITY ROM:  Active ROM Right eval Left eval  Hip flexion    Hip extension    Hip abduction    Hip adduction    Hip internal rotation    Hip external rotation    Knee flexion 114* 135*  Knee extension -2* -6*  Ankle dorsiflexion    Ankle plantarflexion    Ankle inversion    Ankle eversion     (Blank rows = not tested)  LOWER EXTREMITY MMT:  MMT Right eval Left eval  Hip flexion 3- 3-  Hip extension    Hip abduction 2 3-  Hip  adduction    Hip internal rotation    Hip external rotation    Knee flexion 4- 4-  Knee extension 3 3  Ankle dorsiflexion    Ankle plantarflexion    Ankle inversion    Ankle eversion     (Blank rows = not tested)  TREATMENT DATE:   04/20/24  Eval, POC, HEP practice and education as below   Tried variations of bridges- increased pain L LE      PATIENT EDUCATION:  Education details: exam findings, POC, HEP  Person educated: Patient Education method: Programmer, multimedia, Demonstration, and Handouts Education comprehension: verbalized understanding, returned demonstration, and needs further education  HOME EXERCISE PROGRAM:  Access Code: [email or web address] URL: [email or web address] Date: 04/20/2024 Prepared by: Josette Rough  Exercises - Clamshell with Resistance  - 1 x daily - 7 x weekly - 1-2 sets - 5-10 reps - Seated Knee Extension with Anchored Resistance  - 1 x daily - 7 x weekly - 1-2 sets - 5-10 reps - Seated Knee Flexion with Anchored Resistance  - 1 x daily - 7 x weekly - 1-2 sets - 5-10 reps  ASSESSMENT:  CLINICAL IMPRESSION: Patient is a 58 y.o. F who was seen today for physical therapy evaluation and treatment for  Diagnosis  M25.569 (ICD-10-CM) - Knee pain, unspecified chronicity, unspecified laterality  . Of note, she is preparing for a trip to [facility] in mid-August and would like to be able to walk as desired with minimal knee pain. She is critically weak in knee and hip musculature bilaterally, which would also explain why her back has been painful as well here recently. Still awaiting results from testing to r/o autoimmune disease in her presentation. Hopeful that we will be able to improve her pain prior to her trip to [facility].   OBJECTIVE IMPAIRMENTS: Abnormal gait, decreased activity tolerance, decreased balance,  decreased mobility, difficulty walking, decreased ROM, decreased strength, and pain.   ACTIVITY LIMITATIONS: sitting, standing, squatting, stairs, transfers, and locomotion level  PARTICIPATION LIMITATIONS: driving, shopping, and community activity  PERSONAL FACTORS: Fitness, Past/current experiences, and Time since onset of injury/illness/exacerbation are also affecting patient's functional outcome.   REHAB POTENTIAL: Fair limited time before trip to [place], possible autoimmune involvement (still awaiting testing)  CLINICAL DECISION MAKING: Evolving/moderate complexity  EVALUATION COMPLEXITY: Moderate   GOALS: Goals reviewed with patient? No  SHORT TERM GOALS: Target date: 05/11/2024   Will be compliant with appropriate progressive HEP  Baseline: Goal status: INITIAL  2.  R knee flexion ROM to be equal to that of L  Baseline:  Goal status: INITIAL    LONG TERM GOALS: Target date: 06/01/2024    MMT to be at least 4/5 in all tested groups  Baseline:  Goal status: INITIAL  2.  B knee pain to be no more than 4/10 at worst  Baseline:  Goal status: INITIAL  3.  Will be able to ambulate community distances with pain no more than 4/10, LRAD Baseline:  Goal status: INITIAL  4.  Will be compliant with appropriate advanced HEP that she can keep up with while travelling to help avoid pain flare during her trip  Baseline:  Goal status: INITIAL  5.  LEFS to have improved by 15 points  Baseline:  Goal status: INITIAL     PLAN:  PT FREQUENCY: 2x/week  PT DURATION: 6 weeks  PLANNED INTERVENTIONS: 97750- Physical Performance Testing, 97110-Therapeutic exercises, 97530- Therapeutic activity, [id number]- Neuromuscular re-education, 97535- Self Care, [id number]- Manual therapy, [id number]- Gait training, [phone]- Vasopneumatic device, and 97033- Ionotophoresis 4mg /ml Dexamethasone  PLAN FOR NEXT SESSION: knee ROM, gentle strengthening as tolerated, could consider ionto   Josette Rough,  PT, DPT 04/20/24 12:01 PM

## 2024-04-21 DIAGNOSIS — M9902 Segmental and somatic dysfunction of thoracic region: Secondary | ICD-10-CM | POA: Diagnosis not present

## 2024-04-21 DIAGNOSIS — M5416 Radiculopathy, lumbar region: Secondary | ICD-10-CM | POA: Diagnosis not present

## 2024-04-21 DIAGNOSIS — M9903 Segmental and somatic dysfunction of lumbar region: Secondary | ICD-10-CM | POA: Diagnosis not present

## 2024-04-21 DIAGNOSIS — M9904 Segmental and somatic dysfunction of sacral region: Secondary | ICD-10-CM | POA: Diagnosis not present

## 2024-04-23 ENCOUNTER — Other Ambulatory Visit: Payer: Self-pay | Admitting: Family Medicine

## 2024-04-23 DIAGNOSIS — S46819A Strain of other muscles, fascia and tendons at shoulder and upper arm level, unspecified arm, initial encounter: Secondary | ICD-10-CM

## 2024-04-25 ENCOUNTER — Ambulatory Visit: Admitting: Physical Therapy

## 2024-04-25 ENCOUNTER — Encounter: Payer: Self-pay | Admitting: Physical Therapy

## 2024-04-25 DIAGNOSIS — G8929 Other chronic pain: Secondary | ICD-10-CM | POA: Diagnosis not present

## 2024-04-25 DIAGNOSIS — M25562 Pain in left knee: Secondary | ICD-10-CM | POA: Diagnosis not present

## 2024-04-25 DIAGNOSIS — M6281 Muscle weakness (generalized): Secondary | ICD-10-CM | POA: Diagnosis not present

## 2024-04-25 DIAGNOSIS — M25561 Pain in right knee: Secondary | ICD-10-CM | POA: Diagnosis not present

## 2024-04-25 DIAGNOSIS — R262 Difficulty in walking, not elsewhere classified: Secondary | ICD-10-CM | POA: Diagnosis not present

## 2024-04-25 NOTE — Therapy (Signed)
 OUTPATIENT PHYSICAL THERAPY LOWER EXTREMITY TREATMENT    Patient Name: Lindsey Owen MRN: [id number] DOB:08/15/1966, 58 y.o., female Today's Date: 04/25/2024  END OF SESSION:  PT End of Session - 04/25/24 1410     Visit Number 2    Number of Visits 13    Date for PT Re-Evaluation 06/01/24    Authorization Type BCBS    Authorization Time Period 04/20/24 to 06/01/24    PT Start Time 1346    PT Stop Time 1424    PT Time Calculation (min) 38 min    Activity Tolerance Patient tolerated treatment well    Behavior During Therapy [facility] for tasks assessed/performed           Past Medical History:  Diagnosis Date   Diabetes mellitus without complication (HCC)    Hypertension    NAFLD (nonalcoholic fatty liver disease)    Thyroid  disease    Past Surgical History:  Procedure Laterality Date   CERVICAL SPINE SURGERY     CESAREAN SECTION  10/20/1998   UTERINE FIBROID SURGERY     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   NAFLD (nonalcoholic fatty liver disease)    DDD (degenerative disc disease), lumbar 02/27/2020   Low back pain with radiation 09/09/2019   Arthritis 06/01/2019   Type 2 diabetes mellitus with obesity (HCC) 09/10/2018   Essential hypertension 03/19/2015   Hypothyroidism 03/19/2015    PCP: Frann Mabel Mt, DO  REFERRING PROVIDER: Frann Mabel Mt, DO  REFERRING DIAG:  Diagnosis  M25.569 (ICD-10-CM) - Knee pain, unspecified chronicity, unspecified laterality    THERAPY DIAG:  Chronic pain of both knees  Muscle weakness  (generalized)  Difficulty in walking, not elsewhere classified  Rationale for Evaluation and Treatment: Rehabilitation  ONSET DATE: 12 years ago  SUBJECTIVE:   SUBJECTIVE STATEMENT:   Nothing really new, still having a lot of inflammation. Inflammation feels like its the whole back of the knee itself on both sides     EVAL: I think I have arthritis, they hurt a lot, there's a lot of weakness and stiffness. Sometimes they make noises. Sometimes stairs are difficult. Had PT before in [place] in 2014 or 2015, helped for awhile but then this came back. Having a chiropractor work on my back right now, at first we thought the back was causing the knee pain but then he told me to try PT because it two different issues. Pain R knee radiates up and down leg, less on L. I'm going to [facility] on August 18th, [place] it will be lots of walking   PERTINENT HISTORY:  See above  PAIN:  Are you having pain? Yes: NPRS scale: 6-7/10 Pain location: B knees  Pain description: pulling, radiating, cramping, pressure  Aggravating factors: not sure, maybe weather and being on feet too much  Relieving factors: nothing   PRECAUTIONS: None  RED FLAGS: None   WEIGHT BEARING RESTRICTIONS: No  FALLS:  Has patient fallen in last 6 months? 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  DIAGNOSTIC FINDINGS:   Narrative & Impression CLINICAL DATA:  Bilateral knee pain.  Arthritis.    EXAM: LEFT KNEE - COMPLETE 4+ VIEW   COMPARISON:  None Available.   FINDINGS: Moderate medial current joint space narrowing and peripheral osteophytosis. The lateral compartment joint space is maintained. Moderate to high-grade superior patellar and trochlear degenerative osteophytes. 14 mm ossicle posteromedial to the distal femoral metaphysis, likely a loose body. This is separate from the posterolateral fabella.   IMPRESSION: 1. 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 2. There is a 14 mm ossicle posteromedial to the distal femoral metaphysis, likely a loose body.  Narrative & Impression CLINICAL DATA:  Bilateral knee pain.  Arthritis.   EXAM: RIGHT KNEE - COMPLETE 4+ VIEW   COMPARISON:  Right knee radiographs 09/01/2022   FINDINGS: Mild to moderate lateral and mild medial compartment joint space narrowing and peripheral osteophytes.   Moderate to high-grade superior patellar degenerative spurring. No acute fracture or dislocation. No definite joint effusion.   8 mm mineralized density posterior to the distal femoral metaphysis seen medially on frontal view, likely a loose body.   IMPRESSION: 1. Mild-to-moderate lateral and mild medial compartment osteoarthritis. 2. Moderate to high-grade superior patellar degenerative spurring. 3. Probable 8 mm loose body posterior to the distal femoral metaphysis. This is similar to prior.  PATIENT SURVEYS:   LEFS 30/80  COGNITION: Overall cognitive status: Within functional limits for tasks assessed       MUSCLE LENGTH:  HS mild limitation B Piriformis OK     LOWER EXTREMITY ROM:  Active ROM Right eval Left eval Right 04/25/24  Hip flexion     Hip extension     Hip abduction     Hip adduction     Hip internal rotation     Hip external rotation     Knee flexion 114* 135* 131*  Knee extension -2* -6*   Ankle dorsiflexion     Ankle plantarflexion     Ankle inversion     Ankle eversion       (Blank rows = not tested)  LOWER EXTREMITY MMT:  MMT Right eval Left eval  Hip flexion 3- 3-  Hip extension    Hip abduction 2 3-  Hip adduction    Hip internal rotation    Hip external rotation    Knee flexion 4- 4-  Knee extension 3 3  Ankle dorsiflexion    Ankle plantarflexion    Ankle inversion    Ankle eversion     (Blank rows = not tested)  TREATMENT DATE:    04/25/24  Nustep L4x8 minutes all four extremities, seat 7 R knee ROM  Bridges x10 Limited range SLRs 2x5 B with quad set  Supine hip ABD yellow TB x10 B STS x10 no UEs high mat table Standing hip hikes x10 B Standing hip hikes + ABD x10 B Forward step ups 2 inch box 2x5 B       04/20/24  Eval, POC, HEP practice and education as below   Tried variations of bridges- increased pain L LE      PATIENT EDUCATION:  Education details: exam findings, POC, HEP  Person educated: Patient Education method: Programmer, multimedia, Demonstration, and Handouts Education comprehension: verbalized understanding, returned demonstration, and needs further education  HOME EXERCISE PROGRAM:  Access Code: [email or web address] URL: [email or web address] Date: 04/25/2024 Prepared by: Josette Rough  Exercises - Clamshell with Resistance  - 1 x daily - 7 x weekly - 1-2 sets - 5-10 reps - Seated Knee Extension with Anchored Resistance  - 1 x daily - 7 x weekly - 1-2 sets - 5-10 reps - Seated Knee Flexion with Anchored Resistance  - 1 x daily - 7 x weekly - 1-2 sets - 5-10 reps - Supine Bridge  - 1 x daily - 7 x weekly - 1-2 sets - 10 reps - 1 second  hold - Supine Active Straight Leg Raise  - 1 x daily - 7 x weekly - 1-2 sets - 5 reps  ASSESSMENT:  CLINICAL IMPRESSION:   Doing OK,making sure I keep with up with HEP. We cautiously worked on functional strengthening and knee ROM as  tolerated. Seemed to tolerate interventions well today, I did advise her not to overdo her HEP (IE don't do it too many times per day) to make sure she is benefiting instead of increasing inflammation. Regardless, she was able to tolerate some exercises today that were very painful for her at eval.      EVAL: Patient is a 57 y.o. F who was seen today for physical therapy evaluation and treatment for  Diagnosis  M25.569 (ICD-10-CM) - Knee pain, unspecified chronicity, unspecified laterality  . Of note, she is preparing for a trip to [facility] in mid-August and would like to be able to walk as desired with minimal knee pain. She is critically weak in knee and hip musculature bilaterally, which would also explain why her back has been painful as well here recently. Still awaiting results from testing to r/o autoimmune disease in her presentation. Hopeful that we will be able to improve her pain prior to her trip to [facility].   OBJECTIVE IMPAIRMENTS: Abnormal gait, decreased activity tolerance, decreased balance, decreased mobility, difficulty walking, decreased ROM, decreased strength, and pain.   ACTIVITY LIMITATIONS: sitting, standing, squatting, stairs, transfers, and locomotion level  PARTICIPATION LIMITATIONS: driving, shopping, and community activity  PERSONAL FACTORS: Fitness, Past/current experiences, and Time since onset of injury/illness/exacerbation are also affecting patient's functional outcome.   REHAB POTENTIAL: Fair limited time before trip to [place], possible autoimmune involvement (still awaiting testing)  CLINICAL DECISION MAKING: Evolving/moderate complexity  EVALUATION COMPLEXITY: Moderate   GOALS: Goals reviewed with patient? No  SHORT TERM GOALS: Target date: 05/11/2024   Will be compliant with appropriate progressive HEP  Baseline: Goal status: INITIAL  2.  R knee flexion ROM to be equal to that of L  Baseline:  Goal status: INITIAL    LONG TERM GOALS:  Target date: 06/01/2024    MMT to be at least 4/5 in all  tested groups  Baseline:  Goal status: INITIAL  2.  B knee pain to be no more than 4/10 at worst  Baseline:  Goal status: INITIAL  3.  Will be able to ambulate community distances with pain no more than 4/10, LRAD Baseline:  Goal status: INITIAL  4.  Will be compliant with appropriate advanced HEP that she can keep up with while travelling to help avoid pain flare during her trip  Baseline:  Goal status: INITIAL  5.  LEFS to have improved by 15 points  Baseline:  Goal status: INITIAL     PLAN:  PT FREQUENCY: 2x/week  PT DURATION: 6 weeks  PLANNED INTERVENTIONS: 97750- Physical Performance Testing, 97110-Therapeutic exercises, 97530- Therapeutic activity, [id number]- Neuromuscular re-education, 97535- Self Care, 02859- Manual therapy, [id number]- Gait training, [phone]- Vasopneumatic device, and [phone]- Ionotophoresis 4mg /ml Dexamethasone  PLAN FOR NEXT SESSION: knee ROM, gentle strengthening as tolerated, could consider ionto if her pain does not significantly improve with functional exercise   Josette Rough, PT, DPT 04/25/24 2:25 PM

## 2024-04-26 DIAGNOSIS — M9903 Segmental and somatic dysfunction of lumbar region: Secondary | ICD-10-CM | POA: Diagnosis not present

## 2024-04-26 DIAGNOSIS — M5416 Radiculopathy, lumbar region: Secondary | ICD-10-CM | POA: Diagnosis not present

## 2024-04-26 DIAGNOSIS — M9902 Segmental and somatic dysfunction of thoracic region: Secondary | ICD-10-CM | POA: Diagnosis not present

## 2024-04-26 DIAGNOSIS — M9904 Segmental and somatic dysfunction of sacral region: Secondary | ICD-10-CM | POA: Diagnosis not present

## 2024-04-29 DIAGNOSIS — M5416 Radiculopathy, lumbar region: Secondary | ICD-10-CM | POA: Diagnosis not present

## 2024-04-29 DIAGNOSIS — M9902 Segmental and somatic dysfunction of thoracic region: Secondary | ICD-10-CM | POA: Diagnosis not present

## 2024-04-29 DIAGNOSIS — M9903 Segmental and somatic dysfunction of lumbar region: Secondary | ICD-10-CM | POA: Diagnosis not present

## 2024-04-29 DIAGNOSIS — M9904 Segmental and somatic dysfunction of sacral region: Secondary | ICD-10-CM | POA: Diagnosis not present

## 2024-05-04 ENCOUNTER — Encounter: Payer: Self-pay | Admitting: Physical Therapy

## 2024-05-04 ENCOUNTER — Ambulatory Visit: Admitting: Physical Therapy

## 2024-05-04 DIAGNOSIS — M6281 Muscle weakness (generalized): Secondary | ICD-10-CM | POA: Diagnosis not present

## 2024-05-04 DIAGNOSIS — G8929 Other chronic pain: Secondary | ICD-10-CM

## 2024-05-04 DIAGNOSIS — M9903 Segmental and somatic dysfunction of lumbar region: Secondary | ICD-10-CM | POA: Diagnosis not present

## 2024-05-04 DIAGNOSIS — M25562 Pain in left knee: Secondary | ICD-10-CM | POA: Diagnosis not present

## 2024-05-04 DIAGNOSIS — M9904 Segmental and somatic dysfunction of sacral region: Secondary | ICD-10-CM | POA: Diagnosis not present

## 2024-05-04 DIAGNOSIS — M9902 Segmental and somatic dysfunction of thoracic region: Secondary | ICD-10-CM | POA: Diagnosis not present

## 2024-05-04 DIAGNOSIS — M25561 Pain in right knee: Secondary | ICD-10-CM | POA: Diagnosis not present

## 2024-05-04 DIAGNOSIS — R262 Difficulty in walking, not elsewhere classified: Secondary | ICD-10-CM | POA: Diagnosis not present

## 2024-05-04 DIAGNOSIS — M5416 Radiculopathy, lumbar region: Secondary | ICD-10-CM | POA: Diagnosis not present

## 2024-05-04 NOTE — Therapy (Signed)
 OUTPATIENT PHYSICAL THERAPY LOWER EXTREMITY TREATMENT    Patient Name: Lindsey Owen MRN: [id number] DOB:01-Jun-1966, 58 y.o., female Today's Date: 05/04/2024  END OF SESSION:  PT End of Session - 05/04/24 1441     Visit Number 3    Number of Visits 13    Date for PT Re-Evaluation 06/01/24    Authorization Type BCBS    Authorization Time Period 04/20/24 to 06/01/24    PT Start Time 1434    PT Stop Time 1513    PT Time Calculation (min) 39 min    Activity Tolerance Patient tolerated treatment well    Behavior During Therapy [facility] for tasks assessed/performed            Past Medical History:  Diagnosis Date   Diabetes mellitus without complication (HCC)    Hypertension    NAFLD (nonalcoholic fatty liver disease)    Thyroid  disease    Past Surgical History:  Procedure Laterality Date   CERVICAL SPINE SURGERY     CESAREAN SECTION  10/20/1998   UTERINE FIBROID SURGERY     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   NAFLD (nonalcoholic fatty liver disease)    DDD (degenerative disc disease), lumbar 02/27/2020   Low back pain with radiation 09/09/2019   Arthritis 06/01/2019   Type 2 diabetes mellitus with obesity (HCC) 09/10/2018   Essential hypertension 03/19/2015   Hypothyroidism 03/19/2015    PCP: Frann Mabel Mt, DO  REFERRING PROVIDER: Frann Mabel Mt, DO  REFERRING DIAG:  Diagnosis  M25.569 (ICD-10-CM) - Knee pain, unspecified chronicity, unspecified laterality    THERAPY DIAG:  Chronic pain of both knees  Muscle weakness  (generalized)  Difficulty in walking, not elsewhere classified  Rationale for Evaluation and Treatment: Rehabilitation  ONSET DATE: 12 years ago  SUBJECTIVE:   SUBJECTIVE STATEMENT:   Feeling better, have been able to walk more. Ended up being on my feet way too much today, car decided to give up on me but that happens a lot    EVAL: I think I have arthritis, they hurt a lot, there's a lot of weakness and stiffness. Sometimes they make noises. Sometimes stairs are difficult. Had PT before in [place] in 2014 or 2015, helped for awhile but then this came back. Having a chiropractor work on my back right now, at first we thought the back was causing the knee pain but then he told me to try PT because it two different issues. Pain R knee radiates up and down leg, less on L. I'm going to [facility] on August 18th, [place] it [place] be lots of walking   PERTINENT HISTORY:  See above  PAIN:  Are you having pain? Yes: NPRS scale: 4-6/10 Pain location: L knee 4/10, R 6/10 Pain description: pulling, radiating, cramping, pressure  Aggravating factors: not sure, maybe weather and being on feet too much  Relieving factors: nothing   PRECAUTIONS: None  RED FLAGS: None   WEIGHT BEARING RESTRICTIONS: No  FALLS:  Has patient fallen in  last 6 months? 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  DIAGNOSTIC FINDINGS:   Narrative & Impression CLINICAL DATA:  Bilateral knee  pain.  Arthritis.   EXAM: LEFT KNEE - COMPLETE 4+ VIEW   COMPARISON:  None Available.   FINDINGS: Moderate medial current joint space narrowing and peripheral osteophytosis. The lateral compartment joint space is maintained. Moderate to high-grade superior patellar and trochlear degenerative osteophytes. 14 mm ossicle posteromedial to the distal femoral metaphysis, likely a loose body. This is separate from the posterolateral fabella.   IMPRESSION: 1. 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 2. There is a 14 mm ossicle posteromedial to the distal femoral metaphysis, likely a loose body.  Narrative & Impression CLINICAL DATA:  Bilateral knee pain.  Arthritis.   EXAM: RIGHT KNEE - COMPLETE 4+ VIEW   COMPARISON:  Right knee radiographs 09/01/2022   FINDINGS: Mild to moderate lateral and mild medial compartment joint space narrowing and peripheral osteophytes.   Moderate to high-grade superior patellar degenerative spurring. No acute fracture or dislocation. No definite joint effusion.   8 mm mineralized density posterior to the distal femoral metaphysis seen medially on frontal view, likely a loose body.   IMPRESSION: 1. Mild-to-moderate lateral and mild medial compartment osteoarthritis. 2. Moderate to high-grade superior patellar degenerative spurring. 3. Probable 8 mm loose body posterior to the distal femoral metaphysis. This is similar to prior.  PATIENT SURVEYS:   LEFS 30/80  COGNITION: Overall cognitive status: Within functional limits for tasks assessed       MUSCLE LENGTH:  HS mild limitation B Piriformis OK     LOWER EXTREMITY ROM:  Active ROM Right eval Left eval Right 04/25/24  Hip flexion     Hip extension     Hip abduction     Hip adduction     Hip internal rotation     Hip external rotation     Knee flexion 114* 135* 131*  Knee extension -2* -6*   Ankle dorsiflexion     Ankle plantarflexion     Ankle inversion      Ankle eversion      (Blank rows = not tested)  LOWER EXTREMITY MMT:  MMT Right eval Left eval  Hip flexion 3- 3-  Hip extension    Hip abduction 2 3-  Hip adduction    Hip internal rotation    Hip external rotation    Knee flexion 4- 4-  Knee extension 3 3  Ankle dorsiflexion    Ankle plantarflexion    Ankle inversion    Ankle eversion     (Blank rows = not tested)  TREATMENT DATE:    05/04/24  Nustep L5x8 minutes all four extremities, seat 7   For functional strength: Bridges + ABD into red TB x10  Sidelying hip ABD 0# 2x5 B STS + ABD into red TB x10  Hip hikes x15 B Forward step ups 4 inch step x10 B Hip hike + ABD x10 B Lateral step ups 4 inch step x10 B Hip hike + forward/backward x10 B  LAQs green TB x10 B   04/25/24  Nustep L4x8 minutes all four extremities, seat 7 R knee ROM  Bridges x10 Limited range SLRs 2x5 B with quad set  Supine hip ABD yellow TB x10 B STS x10 no UEs high mat table Standing hip hikes x10 B Standing hip hikes + ABD x10 B Forward step ups 2 inch box 2x5 B       04/20/24  Eval, POC, HEP practice and education as below   Tried variations of bridges- increased pain L LE      PATIENT EDUCATION:  Education details: exam findings, POC, HEP  Person educated: Patient Education method: Programmer, multimedia, Demonstration, and Handouts Education comprehension: verbalized understanding, returned demonstration, and needs further education  HOME EXERCISE PROGRAM:  Access Code: [email or web address] URL: [email or web address] Date: 05/04/2024 Prepared by: Josette Rough  Exercises - Seated Knee Extension with Anchored Resistance  - 1 x daily - 7 x weekly - 1-2 sets - 5-10 reps - Seated Knee Flexion with Anchored Resistance  - 1 x daily - 7 x weekly - 1-2 sets - 5-10 reps - Supine Active Straight  Leg Raise  - 1 x daily - 7 x weekly - 1-2 sets - 5 reps - Supine Bridge with Resistance Band  - 1 x daily - 7 x weekly - 1-2 sets - 10 reps - Sidelying Hip Abduction  - 1 x daily - 7 x weekly - 1-2 sets - 5-10 reps - Sit to Stand with Resistance Around Legs  - 1 x daily - 7 x weekly - 1-2 sets - 10 reps - Standing Hip Hiking  - 1 x daily - 7 x weekly - 1-2 sets - 10 reps  ASSESSMENT:  CLINICAL IMPRESSION:   Arrives today doing OK, has been feeling better since last visit. We continued working on general functional strengthening as tolerated today, seems to be making forward progress with skilled PT services. R knee continues to be more painful and inflamed than the L, but shows great improvements in functional exercise tolerance thus far.     EVAL: Patient is a 58 y.o. F who was seen today for physical therapy evaluation and treatment for  Diagnosis  M25.569 (ICD-10-CM) - Knee pain, unspecified chronicity, unspecified laterality  . Of note, she is preparing for a trip to [facility] in mid-August and would like to be able to walk as desired with minimal knee pain. She is critically weak in knee and hip musculature bilaterally, which would also explain why her back has been painful as well here recently. Still awaiting results from testing to r/o autoimmune disease in her presentation. Hopeful that we will be able to improve her pain prior to her trip to [facility].   OBJECTIVE IMPAIRMENTS: Abnormal gait, decreased activity tolerance, decreased balance, decreased mobility, difficulty walking, decreased ROM, decreased strength, and pain.   ACTIVITY LIMITATIONS: sitting, standing, squatting, stairs, transfers, and locomotion level  PARTICIPATION LIMITATIONS: driving, shopping, and community activity  PERSONAL FACTORS: Fitness, Past/current experiences, and Time since onset of injury/illness/exacerbation are also affecting patient's functional  outcome.   REHAB POTENTIAL: Fair limited time  before trip to [place], possible autoimmune involvement (still awaiting testing)  CLINICAL DECISION MAKING: Evolving/moderate complexity  EVALUATION COMPLEXITY: Moderate   GOALS: Goals reviewed with patient? No  SHORT TERM GOALS: Target date: 05/11/2024   Will be compliant with appropriate progressive HEP  Baseline: Goal status: INITIAL  2.  R knee flexion ROM to be equal to that of L  Baseline:  Goal status: INITIAL    LONG TERM GOALS: Target date: 06/01/2024    MMT to be at least 4/5 in all tested groups  Baseline:  Goal status: INITIAL  2.  B knee pain to be no more than 4/10 at worst  Baseline:  Goal status: INITIAL  3.  Will be able to ambulate community distances with pain no more than 4/10, LRAD Baseline:  Goal status: INITIAL  4.  Will be compliant with appropriate advanced HEP that she can keep up with while travelling to help avoid pain flare during her trip  Baseline:  Goal status: INITIAL  5.  LEFS to have improved by 15 points  Baseline:  Goal status: INITIAL     PLAN:  PT FREQUENCY: 2x/week  PT DURATION: 6 weeks  PLANNED INTERVENTIONS: 97750- Physical Performance Testing, 97110-Therapeutic exercises, 97530- Therapeutic activity, [id number]- Neuromuscular re-education, 97535- Self Care, [id number]- Manual therapy, [id number]- Gait training, [id number]- Vasopneumatic device, and 97033- Ionotophoresis 4mg /ml Dexamethasone  PLAN FOR NEXT SESSION: knee ROM, gentle strengthening as tolerated, could consider ionto if her pain does not significantly improve with functional exercise. Keep in mind that she will be on [place] fast after 7/25 (in terms of intensity of exercise/interventions and recovery)  Josette Rough, PT, DPT 05/04/24 3:13 PM

## 2024-05-05 ENCOUNTER — Other Ambulatory Visit: Payer: Self-pay | Admitting: Family Medicine

## 2024-05-05 DIAGNOSIS — E1169 Type 2 diabetes mellitus with other specified complication: Secondary | ICD-10-CM

## 2024-05-06 DIAGNOSIS — M5416 Radiculopathy, lumbar region: Secondary | ICD-10-CM | POA: Diagnosis not present

## 2024-05-06 DIAGNOSIS — M9903 Segmental and somatic dysfunction of lumbar region: Secondary | ICD-10-CM | POA: Diagnosis not present

## 2024-05-06 DIAGNOSIS — M9904 Segmental and somatic dysfunction of sacral region: Secondary | ICD-10-CM | POA: Diagnosis not present

## 2024-05-06 DIAGNOSIS — M9902 Segmental and somatic dysfunction of thoracic region: Secondary | ICD-10-CM | POA: Diagnosis not present

## 2024-05-10 ENCOUNTER — Encounter: Payer: Self-pay | Admitting: Physical Therapy

## 2024-05-10 ENCOUNTER — Ambulatory Visit: Admitting: Physical Therapy

## 2024-05-10 DIAGNOSIS — M25561 Pain in right knee: Secondary | ICD-10-CM | POA: Diagnosis not present

## 2024-05-10 DIAGNOSIS — G8929 Other chronic pain: Secondary | ICD-10-CM

## 2024-05-10 DIAGNOSIS — M6281 Muscle weakness (generalized): Secondary | ICD-10-CM | POA: Diagnosis not present

## 2024-05-10 DIAGNOSIS — R262 Difficulty in walking, not elsewhere classified: Secondary | ICD-10-CM

## 2024-05-10 DIAGNOSIS — M25562 Pain in left knee: Secondary | ICD-10-CM | POA: Diagnosis not present

## 2024-05-10 NOTE — Therapy (Signed)
 OUTPATIENT PHYSICAL THERAPY LOWER EXTREMITY TREATMENT    Patient Name: Lindsey Owen MRN: [id number] DOB:10/28/1965, 58 y.o., female Today's Date: 05/10/2024  END OF SESSION:  PT End of Session - 05/10/24 1528     Visit Number 4    Number of Visits 13    Date for PT Re-Evaluation 06/01/24    Authorization Type BCBS    Authorization Time Period 04/20/24 to 06/01/24    PT Start Time 1518    PT Stop Time 1556    PT Time Calculation (min) 38 min    Activity Tolerance Patient tolerated treatment well    Behavior During Therapy [facility] for tasks assessed/performed             Past Medical History:  Diagnosis Date   Diabetes mellitus without complication (HCC)    Hypertension    NAFLD (nonalcoholic fatty liver disease)    Thyroid  disease    Past Surgical History:  Procedure Laterality Date   CERVICAL SPINE SURGERY     CESAREAN SECTION  10/20/1998   UTERINE FIBROID SURGERY     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   NAFLD (nonalcoholic fatty liver disease)    DDD (degenerative disc disease), lumbar 02/27/2020   Low back pain with radiation 09/09/2019   Arthritis 06/01/2019   Type 2 diabetes mellitus with obesity (HCC) 09/10/2018   Essential hypertension 03/19/2015   Hypothyroidism 03/19/2015    PCP: Frann Mabel Mt, DO  REFERRING PROVIDER: Frann Mabel Mt, DO  REFERRING DIAG:  Diagnosis  M25.569 (ICD-10-CM) - Knee pain, unspecified chronicity, unspecified laterality    THERAPY DIAG:  Chronic pain of both knees  Muscle weakness  (generalized)  Difficulty in walking, not elsewhere classified  Rationale for Evaluation and Treatment: Rehabilitation  ONSET DATE: 12 years ago  SUBJECTIVE:   SUBJECTIVE STATEMENT:   Nothing new since last time, felt off due to the weather this morning   EVAL: I think I have arthritis, they hurt a lot, there's a lot of weakness and stiffness. Sometimes they make noises. Sometimes stairs are difficult. Had PT before in [place] in 2014 or 2015, helped for awhile but then this came back. Having a chiropractor work on my back right now, at first we thought the back was causing the knee pain but then he told me to try PT because it two different issues. Pain R knee radiates up and down leg, less on L. I'm going to [facility] on August 18th, [place] it will be lots of walking   PERTINENT HISTORY:  See above  PAIN:  Are you having pain? Yes: NPRS scale: 4/10 Pain location: B knees 4/10 Pain description: pulling, radiating, cramping, pressure  Aggravating factors: not sure, maybe weather and being on feet too much  Relieving factors: nothing   PRECAUTIONS: None  RED FLAGS: None   WEIGHT BEARING RESTRICTIONS: No  FALLS:  Has patient fallen in last 6 months? 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  DIAGNOSTIC FINDINGS:   Narrative & Impression CLINICAL DATA:  Bilateral knee pain.  Arthritis.   EXAM: LEFT KNEE - COMPLETE 4+ VIEW   COMPARISON:  None  Available.   FINDINGS: Moderate medial current joint space narrowing and peripheral osteophytosis. The lateral compartment joint space is maintained. Moderate to high-grade superior patellar and trochlear degenerative osteophytes. 14 mm ossicle posteromedial to the distal femoral metaphysis, likely a loose body. This is separate from the posterolateral fabella.   IMPRESSION: 1. 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 2. There is a 14 mm ossicle posteromedial to the distal femoral metaphysis, likely a loose body.  Narrative & Impression CLINICAL DATA:  Bilateral knee pain.  Arthritis.   EXAM: RIGHT KNEE - COMPLETE 4+ VIEW   COMPARISON:  Right knee radiographs 09/01/2022   FINDINGS: Mild to moderate lateral and mild medial compartment joint space narrowing and peripheral osteophytes.   Moderate to high-grade superior patellar degenerative spurring. No acute fracture or dislocation. No definite joint effusion.   8 mm mineralized density posterior to the distal femoral metaphysis seen medially on frontal view, likely a loose body.   IMPRESSION: 1. Mild-to-moderate lateral and mild medial compartment osteoarthritis. 2. Moderate to high-grade superior patellar degenerative spurring. 3. Probable 8 mm loose body posterior to the distal femoral metaphysis. This is similar to prior.  PATIENT SURVEYS:   LEFS 30/80  COGNITION: Overall cognitive status: Within functional limits for tasks assessed       MUSCLE LENGTH:  HS mild limitation B Piriformis OK     LOWER EXTREMITY ROM:  Active ROM Right eval Left eval Right 04/25/24  Hip flexion     Hip extension     Hip abduction     Hip adduction     Hip internal rotation     Hip external rotation     Knee flexion 114* 135* 131*  Knee extension -2* -6*   Ankle dorsiflexion     Ankle plantarflexion     Ankle inversion     Ankle eversion      (Blank rows = not tested)  LOWER EXTREMITY  MMT:  MMT Right eval Left eval  Hip flexion 3- 3-  Hip extension    Hip abduction 2 3-  Hip adduction    Hip internal rotation    Hip external rotation    Knee flexion 4- 4-  Knee extension 3 3  Ankle dorsiflexion    Ankle plantarflexion    Ankle inversion    Ankle eversion     (Blank rows = not tested)  TREATMENT DATE:    05/10/24  Nustep L5x8 minutes all four extremities seat 7 Forward step ups 4 inch step x15 B  Wall squats self-selected depth x12 (some joint line pain) Hip hikes x15 B Lateral step ups 4 inch step x15 B  TM speed 2.0 with progressive incline forward x4 min (max incline 4.0) Standing hip ABD red TB x12 B Standing hip flexion red TB x12 B Standing hip extension red TB x12 B Hip hikes + swing x10 B Forward step downs 4 inch step x10 B cues for eccentric control   05/04/24  Nustep L5x8 minutes all four extremities, seat 7     For functional strength: Bridges + ABD into red TB x10  Sidelying hip ABD 0# 2x5 B STS + ABD into red TB x10  Hip hikes x15 B Forward step ups 4 inch step x10 B Hip hike + ABD x10 B Lateral step ups 4 inch step x10 B Hip hike + forward/backward x10 B  LAQs green TB x10 B   04/25/24  Nustep L4x8 minutes all four extremities, seat 7 R knee ROM  Bridges x10 Limited range SLRs 2x5 B with quad set  Supine hip ABD yellow TB x10 B STS x10 no UEs high mat table Standing hip hikes x10 B Standing hip hikes + ABD x10 B Forward step ups 2 inch box 2x5 B       04/20/24  Eval, POC, HEP practice and education as below   Tried variations of bridges- increased pain L LE      PATIENT EDUCATION:  Education details: exam findings, POC, HEP  Person educated: Patient Education method: Programmer, multimedia, Demonstration, and Handouts Education comprehension: verbalized understanding, returned  demonstration, and needs further education  HOME EXERCISE PROGRAM:  Access Code: [email or web address] URL: [email or web address] Date: 05/04/2024 Prepared by: Josette Rough  Exercises - Seated Knee Extension with Anchored Resistance  - 1 x daily - 7 x weekly - 1-2 sets - 5-10 reps - Seated Knee Flexion with Anchored Resistance  - 1 x daily - 7 x weekly - 1-2 sets - 5-10 reps - Supine Active Straight Leg Raise  - 1 x daily - 7 x weekly - 1-2 sets - 5 reps - Supine Bridge with Resistance Band  - 1 x daily - 7 x weekly - 1-2 sets - 10 reps - Sidelying Hip Abduction  - 1 x daily - 7 x weekly - 1-2 sets - 5-10 reps - Sit to Stand with Resistance Around Legs  - 1 x daily - 7 x weekly - 1-2 sets - 10 reps - Standing Hip Hiking  - 1 x daily - 7 x weekly - 1-2 sets - 10 reps  ASSESSMENT:  CLINICAL IMPRESSION:   Arrives today doing OK, felt some pain this morning due to the weather and rain, but alright this afternoon. She has been progressing walking program on her own to help with preparation for her trip. Progressed all activities as able/tolerated today, will continue progressive challenges.     EVAL: Patient is a 58 y.o. F who was seen today for physical therapy evaluation and treatment for  Diagnosis  M25.569 (ICD-10-CM) - Knee pain, unspecified chronicity, unspecified laterality  . Of note, she is preparing for a trip to [facility] in mid-August and would like to be able to walk as desired with minimal knee pain. She is critically weak in knee and hip musculature bilaterally, which would also explain why her back has been painful as well  here recently. Still awaiting results from testing to r/o autoimmune disease in her presentation. Hopeful that we will be able to improve her pain prior to her trip to [facility].   OBJECTIVE IMPAIRMENTS: Abnormal gait, decreased activity tolerance, decreased balance, decreased mobility, difficulty walking, decreased ROM, decreased strength, and pain.    ACTIVITY LIMITATIONS: sitting, standing, squatting, stairs, transfers, and locomotion level  PARTICIPATION LIMITATIONS: driving, shopping, and community activity  PERSONAL FACTORS: Fitness, Past/current experiences, and Time since onset of injury/illness/exacerbation are also affecting patient's functional outcome.   REHAB POTENTIAL: Fair limited time before trip to [place], possible autoimmune involvement (still awaiting testing)  CLINICAL DECISION MAKING: Evolving/moderate complexity  EVALUATION COMPLEXITY: Moderate   GOALS: Goals reviewed with patient? No  SHORT TERM GOALS: Target date: 05/11/2024   Will be compliant with appropriate progressive HEP  Baseline: Goal status: INITIAL  2.  R knee flexion ROM to be equal to that of L  Baseline:  Goal status: INITIAL    LONG TERM GOALS: Target date: 06/01/2024    MMT to be at least 4/5 in all tested groups  Baseline:  Goal status: INITIAL  2.  B knee pain to be no more than 4/10 at worst  Baseline:  Goal status: INITIAL  3.  Will be able to ambulate community distances with pain no more than 4/10, LRAD Baseline:  Goal status: INITIAL  4.  Will be compliant with appropriate advanced HEP that she can keep up with while travelling to help avoid pain flare during her trip  Baseline:  Goal status: INITIAL  5.  LEFS to have improved by 15 points  Baseline:  Goal status: INITIAL     PLAN:  PT FREQUENCY: 2x/week  PT DURATION: 6 weeks  PLANNED INTERVENTIONS: 97750- Physical Performance Testing, 97110-Therapeutic exercises, 97530- Therapeutic activity, [id number]- Neuromuscular re-education, 97535- Self Care, 02859- Manual therapy, [id number]- Gait training, [id number]- Vasopneumatic device, and [id number]- Ionotophoresis 4mg /ml Dexamethasone  PLAN FOR NEXT SESSION: knee ROM, gentle strengthening as tolerated, could consider ionto if her pain does not significantly improve with functional exercise. Keep in mind that she will be on  [place] fast after 7/25 (in terms of intensity of exercise/interventions and recovery). She will need advanced HEP, requesting only 3 more appts due to starting fast/being super busy in August getting ready for the trip   Josette Rough, [place], DPT 05/10/24 4:03 PM

## 2024-05-11 DIAGNOSIS — M9903 Segmental and somatic dysfunction of lumbar region: Secondary | ICD-10-CM | POA: Diagnosis not present

## 2024-05-11 DIAGNOSIS — M5416 Radiculopathy, lumbar region: Secondary | ICD-10-CM | POA: Diagnosis not present

## 2024-05-11 DIAGNOSIS — M9902 Segmental and somatic dysfunction of thoracic region: Secondary | ICD-10-CM | POA: Diagnosis not present

## 2024-05-11 DIAGNOSIS — M9904 Segmental and somatic dysfunction of sacral region: Secondary | ICD-10-CM | POA: Diagnosis not present

## 2024-05-11 DIAGNOSIS — M62830 Muscle spasm of back: Secondary | ICD-10-CM | POA: Diagnosis not present

## 2024-05-12 ENCOUNTER — Other Ambulatory Visit: Payer: Self-pay | Admitting: Family Medicine

## 2024-05-12 ENCOUNTER — Ambulatory Visit: Admitting: Physical Therapy

## 2024-05-12 DIAGNOSIS — S46819A Strain of other muscles, fascia and tendons at shoulder and upper arm level, unspecified arm, initial encounter: Secondary | ICD-10-CM

## 2024-05-13 ENCOUNTER — Other Ambulatory Visit (HOSPITAL_BASED_OUTPATIENT_CLINIC_OR_DEPARTMENT_OTHER): Payer: Self-pay | Admitting: Family Medicine

## 2024-05-13 DIAGNOSIS — M9904 Segmental and somatic dysfunction of sacral region: Secondary | ICD-10-CM | POA: Diagnosis not present

## 2024-05-13 DIAGNOSIS — M9903 Segmental and somatic dysfunction of lumbar region: Secondary | ICD-10-CM | POA: Diagnosis not present

## 2024-05-13 DIAGNOSIS — M62830 Muscle spasm of back: Secondary | ICD-10-CM | POA: Diagnosis not present

## 2024-05-13 DIAGNOSIS — Z1231 Encounter for screening mammogram for malignant neoplasm of breast: Secondary | ICD-10-CM

## 2024-05-13 DIAGNOSIS — M5416 Radiculopathy, lumbar region: Secondary | ICD-10-CM | POA: Diagnosis not present

## 2024-05-17 ENCOUNTER — Ambulatory Visit: Admitting: Physical Therapy

## 2024-05-17 ENCOUNTER — Encounter: Payer: Self-pay | Admitting: Physical Therapy

## 2024-05-17 DIAGNOSIS — G8929 Other chronic pain: Secondary | ICD-10-CM | POA: Diagnosis not present

## 2024-05-17 DIAGNOSIS — M5416 Radiculopathy, lumbar region: Secondary | ICD-10-CM | POA: Diagnosis not present

## 2024-05-17 DIAGNOSIS — M6281 Muscle weakness (generalized): Secondary | ICD-10-CM | POA: Diagnosis not present

## 2024-05-17 DIAGNOSIS — R262 Difficulty in walking, not elsewhere classified: Secondary | ICD-10-CM | POA: Diagnosis not present

## 2024-05-17 DIAGNOSIS — M9903 Segmental and somatic dysfunction of lumbar region: Secondary | ICD-10-CM | POA: Diagnosis not present

## 2024-05-17 DIAGNOSIS — M25562 Pain in left knee: Secondary | ICD-10-CM | POA: Diagnosis not present

## 2024-05-17 DIAGNOSIS — M62830 Muscle spasm of back: Secondary | ICD-10-CM | POA: Diagnosis not present

## 2024-05-17 DIAGNOSIS — M9904 Segmental and somatic dysfunction of sacral region: Secondary | ICD-10-CM | POA: Diagnosis not present

## 2024-05-17 DIAGNOSIS — M25561 Pain in right knee: Secondary | ICD-10-CM | POA: Diagnosis not present

## 2024-05-17 NOTE — Therapy (Signed)
 OUTPATIENT PHYSICAL THERAPY LOWER EXTREMITY TREATMENT    Patient Name: Lindsey Owen MRN: [id number] DOB:06/28/1966, 58 y.o., female Today's Date: 05/17/2024  END OF SESSION:  PT End of Session - 05/17/24 1527     Visit Number 5    Number of Visits 13    Date for PT Re-Evaluation 06/01/24    Authorization Type BCBS    Authorization Time Period 04/20/24 to 06/01/24    PT Start Time 1517    PT Stop Time 1556    PT Time Calculation (min) 39 min    Activity Tolerance Patient tolerated treatment well    Behavior During Therapy [facility] for tasks assessed/performed              Past Medical History:  Diagnosis Date   Diabetes mellitus without complication (HCC)    Hypertension    NAFLD (nonalcoholic fatty liver disease)    Thyroid  disease    Past Surgical History:  Procedure Laterality Date   CERVICAL SPINE SURGERY     CESAREAN SECTION  10/20/1998   UTERINE FIBROID SURGERY     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   NAFLD (nonalcoholic fatty liver disease)    DDD (degenerative disc disease), lumbar 02/27/2020   Low back pain with radiation 09/09/2019   Arthritis 06/01/2019   Type 2 diabetes mellitus with obesity (HCC) 09/10/2018   Essential hypertension 03/19/2015   Hypothyroidism 03/19/2015    PCP: Frann Mabel Mt, DO  REFERRING PROVIDER: Frann Mabel Mt, DO  REFERRING DIAG:  Diagnosis  M25.569 (ICD-10-CM) - Knee pain, unspecified chronicity, unspecified laterality    THERAPY DIAG:  Chronic pain of both knees  Difficulty in walking, not  elsewhere classified  Muscle weakness (generalized)  Rationale for Evaluation and Treatment: Rehabilitation  ONSET DATE: 12 years ago  SUBJECTIVE:   SUBJECTIVE STATEMENT:   Knees are feeling good, have been increasing walking distance, chiropractor has been working on my back and some of the exercises that he's given my back feel like they are working the knees   EVAL: I think I have arthritis, they hurt a lot, there's a lot of weakness and stiffness. Sometimes they make noises. Sometimes stairs are difficult. Had PT before in [place] in 2014 or 2015, helped for awhile but then this came back. Having a chiropractor work on my back right now, at first we thought the back was causing the knee pain but then he told me to try PT because it two different issues. Pain R knee radiates up and down leg, less on L. I'm going to [facility] on August 18th, [place] it [place] be lots of walking   PERTINENT HISTORY:  See above  PAIN:  Are you having pain? No 0/10 at rest   PRECAUTIONS: None  RED FLAGS: None   WEIGHT BEARING RESTRICTIONS: No  FALLS:  Has patient fallen in last 6 months? 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  DIAGNOSTIC FINDINGS:   Narrative & Impression CLINICAL DATA:  Bilateral knee pain.  Arthritis.   EXAM: LEFT KNEE - COMPLETE 4+ VIEW   COMPARISON:  None Available.   FINDINGS: Moderate medial current joint space narrowing and  peripheral osteophytosis. The lateral compartment joint space is maintained. Moderate to high-grade superior patellar and trochlear degenerative osteophytes. 14 mm ossicle posteromedial to the distal femoral metaphysis, likely a loose body. This is separate from the posterolateral fabella.   IMPRESSION: 1. 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 2. There is a 14 mm ossicle posteromedial to the distal femoral metaphysis, likely a loose body.  Narrative & Impression CLINICAL DATA:  Bilateral knee pain.  Arthritis.   EXAM: RIGHT KNEE - COMPLETE 4+ VIEW   COMPARISON:  Right knee radiographs 09/01/2022   FINDINGS: Mild to moderate lateral and mild medial compartment joint space narrowing and peripheral osteophytes.   Moderate to high-grade superior patellar degenerative spurring. No acute fracture or dislocation. No definite joint effusion.   8 mm mineralized density posterior to the distal femoral metaphysis seen medially on frontal view, likely a loose body.   IMPRESSION: 1. Mild-to-moderate lateral and mild medial compartment osteoarthritis. 2. Moderate to high-grade superior patellar degenerative spurring. 3. Probable 8 mm loose body posterior to the distal femoral metaphysis. This is similar to prior.  PATIENT SURVEYS:   LEFS 30/80  COGNITION: Overall cognitive status: Within functional limits for tasks assessed       MUSCLE LENGTH:  HS mild limitation B Piriformis OK     LOWER EXTREMITY ROM:  Active ROM Right eval Left eval Right 04/25/24  Hip flexion     Hip extension     Hip abduction     Hip adduction     Hip internal rotation     Hip external rotation     Knee flexion 114* 135* 131*  Knee extension -2* -6*   Ankle dorsiflexion     Ankle plantarflexion     Ankle inversion     Ankle eversion      (Blank rows = not tested)  LOWER EXTREMITY MMT:  MMT Right eval Left eval Right 05/17/24 Left 05/17/24  Hip flexion 3- 3- 4-  4-  Hip extension      Hip abduction 2 3- 4- 4-  Hip adduction      Hip internal rotation      Hip external rotation      Knee flexion 4- 4- 4+ 4+  Knee extension 3 3 4+ 4+  Ankle dorsiflexion      Ankle plantarflexion      Ankle inversion      Ankle eversion       (Blank rows = not tested)  TREATMENT DATE:    05/17/24  Nustep L5x8 minutes BLEs seat 7 MMT  TM speed 2.0, progressive incline 2-->6.5 (5 min on TM total)  Forward step ups 6 inch step- power up/eccentric lower x10 B Hip hikes + swing x10 B Lateral step ups 6 inch step x10 B Wall squats x10 (no joint pain today) Forward step downs 6 inch step x10 B 3 way taps from blue foam pad cues to not hyper-extend stance knee x5 B    05/10/24  Nustep L5x8 minutes all four extremities seat 7 Forward step ups 4 inch step x15 B  Wall squats self-selected depth x12 (some joint line pain) Hip hikes x15 B Lateral step ups 4 inch step x15 B  TM speed 2.0 with progressive incline forward x4 min (max incline 4.0) Standing hip ABD red TB x12 B Standing hip flexion red TB x12 B Standing hip extension red TB x12 B Hip hikes + swing x10 B Forward step downs 4 inch step x10 B cues for eccentric control        PATIENT EDUCATION:  Education details: exam findings, POC, HEP  Person educated: Patient Education method: Programmer, multimedia, Demonstration, and Handouts Education comprehension: verbalized understanding, returned demonstration, and needs further education  HOME EXERCISE PROGRAM:  Access Code: [email or web address] URL: [email or web address] Date: 05/04/2024 Prepared by: Josette Rough  Exercises - Seated Knee Extension with Anchored Resistance  - 1 x daily - 7 x weekly - 1-2 sets - 5-10 reps - Seated Knee Flexion with Anchored Resistance  - 1 x daily - 7 x weekly - 1-2 sets - 5-10  reps - Supine Active Straight Leg Raise  - 1 x daily - 7 x weekly - 1-2 sets - 5 reps - Supine Bridge with Resistance Band  - 1 x daily - 7 x weekly - 1-2 sets - 10 reps - Sidelying Hip Abduction  - 1 x daily - 7 x weekly - 1-2 sets - 5-10 reps - Sit to Stand with Resistance Around Legs  - 1 x daily - 7 x weekly - 1-2 sets - 10 reps - Standing Hip Hiking  - 1 x daily - 7 x weekly - 1-2 sets - 10 reps  ASSESSMENT:  CLINICAL IMPRESSION:   Arrives today feeling better, only has 2 more appointments left before she will need to wrap up and focus on her trip. Continued working on functional strength and mm endurance, also updated HEP as appropriate today. Progressing very well.     EVAL: Patient is a 58 y.o. F who was seen today for physical therapy evaluation and treatment for  Diagnosis  M25.569 (ICD-10-CM) - Knee pain, unspecified chronicity, unspecified laterality  . Of note, she is preparing for a trip to [facility] in mid-August and would like to be able to walk as desired with minimal knee pain. She is critically weak in knee and hip musculature bilaterally, which would also explain why her back has been painful as well here recently. Still awaiting results from testing to r/o autoimmune disease in her presentation. Hopeful that we will be able to improve her pain prior to her trip to [facility].   OBJECTIVE IMPAIRMENTS: Abnormal gait, decreased activity tolerance, decreased balance, decreased mobility, difficulty walking, decreased ROM, decreased strength, and pain.   ACTIVITY LIMITATIONS: sitting, standing, squatting, stairs, transfers, and locomotion level  PARTICIPATION LIMITATIONS: driving, shopping, and community activity  PERSONAL FACTORS: Fitness, Past/current experiences, and Time since onset of injury/illness/exacerbation are also affecting patient's functional outcome.  REHAB POTENTIAL: Fair limited time before trip to [place], possible autoimmune involvement (still awaiting  testing)  CLINICAL DECISION MAKING: Evolving/moderate complexity  EVALUATION COMPLEXITY: Moderate   GOALS: Goals reviewed with patient? No  SHORT TERM GOALS: Target date: 05/11/2024   Will be compliant with appropriate progressive HEP  Baseline: Goal status: INITIAL  2.  R knee flexion ROM to be equal to that of L  Baseline:  Goal status: INITIAL    LONG TERM GOALS: Target date: 06/01/2024    MMT to be at least 4/5 in all tested groups  Baseline:  Goal status: INITIAL  2.  B knee pain to be no more than 4/10 at worst  Baseline:  Goal status: INITIAL  3.  Will be able to ambulate community distances with pain no more than 4/10, LRAD Baseline:  Goal status: INITIAL  4.  Will be compliant with appropriate advanced HEP that she can keep up with while travelling to help avoid pain flare during her trip  Baseline:  Goal status: INITIAL  5.  LEFS to have improved by 15 points  Baseline:  Goal status: INITIAL     PLAN:  PT FREQUENCY: 2x/week  PT DURATION: 6 weeks  PLANNED INTERVENTIONS: 97750- Physical Performance Testing, 97110-Therapeutic exercises, 97530- Therapeutic activity, [id number]- Neuromuscular re-education, 97535- Self Care, [id number]- Manual therapy, [id number]- Gait training, [phone]- Vasopneumatic device, and 97033- Ionotophoresis 4mg /ml Dexamethasone  PLAN FOR NEXT SESSION: knee ROM, gentle strengthening as tolerated, could consider ionto if her pain does not significantly improve with functional exercise. Keep in mind that she will be on [place] fast after 7/25 (in terms of intensity of exercise/interventions and recovery). 2 more visits left before DC per her request   Josette Rough, PT, DPT 05/17/24 3:57 PM

## 2024-05-19 ENCOUNTER — Ambulatory Visit: Admitting: Physical Therapy

## 2024-05-19 ENCOUNTER — Encounter: Payer: Self-pay | Admitting: Physical Therapy

## 2024-05-19 DIAGNOSIS — M25561 Pain in right knee: Secondary | ICD-10-CM | POA: Diagnosis not present

## 2024-05-19 DIAGNOSIS — R262 Difficulty in walking, not elsewhere classified: Secondary | ICD-10-CM | POA: Diagnosis not present

## 2024-05-19 DIAGNOSIS — M6281 Muscle weakness (generalized): Secondary | ICD-10-CM | POA: Diagnosis not present

## 2024-05-19 DIAGNOSIS — G8929 Other chronic pain: Secondary | ICD-10-CM | POA: Diagnosis not present

## 2024-05-19 DIAGNOSIS — M25562 Pain in left knee: Secondary | ICD-10-CM | POA: Diagnosis not present

## 2024-05-19 NOTE — Therapy (Signed)
 OUTPATIENT PHYSICAL THERAPY LOWER EXTREMITY TREATMENT    Patient Name: Lindsey Owen MRN: [id number] DOB:1966/03/11, 58 y.o., female Today's Date: 05/19/2024  END OF SESSION:  PT End of Session - 05/19/24 1402     Visit Number 6    Number of Visits 7    Date for PT Re-Evaluation 06/01/24    Authorization Type BCBS    Authorization Time Period 04/20/24 to 06/01/24    PT Start Time 1347    PT Stop Time 1427    PT Time Calculation (min) 40 min    Activity Tolerance Patient tolerated treatment well    Behavior During Therapy [facility] for tasks assessed/performed               Past Medical History:  Diagnosis Date   Diabetes mellitus without complication (HCC)    Hypertension    NAFLD (nonalcoholic fatty liver disease)    Thyroid  disease    Past Surgical History:  Procedure Laterality Date   CERVICAL SPINE SURGERY     CESAREAN SECTION  10/20/1998   UTERINE FIBROID SURGERY     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   NAFLD (nonalcoholic fatty liver disease)    DDD (degenerative disc disease), lumbar 02/27/2020   Low back pain with radiation 09/09/2019   Arthritis 06/01/2019   Type 2 diabetes mellitus with obesity (HCC) 09/10/2018   Essential hypertension 03/19/2015   Hypothyroidism 03/19/2015    PCP: Frann Mabel Mt, DO  REFERRING PROVIDER: Frann Mabel Mt, DO  REFERRING DIAG:  Diagnosis  M25.569 (ICD-10-CM) - Knee pain, unspecified chronicity, unspecified laterality    THERAPY DIAG:  Chronic pain of both knees  Difficulty in walking, not  elsewhere classified  Muscle weakness (generalized)  Rationale for Evaluation and Treatment: Rehabilitation  ONSET DATE: 12 years ago  SUBJECTIVE:   SUBJECTIVE STATEMENT:   Knees are already bothering me with this weather coming in  EVAL: I think I have arthritis, they hurt a lot, there's a lot of weakness and stiffness. Sometimes they make noises. Sometimes stairs are difficult. Had PT before in [place] in 2014 or 2015, helped for awhile but then this came back. Having a chiropractor work on my back right now, at first we thought the back was causing the knee pain but then he told me to try PT because it two different issues. Pain R knee radiates up and down leg, less on L. I'm going to [facility] on August 18th, [place] it will be lots of walking   PERTINENT HISTORY:  See above  PAIN:  Are you having pain? Yes: NPRS scale: 5-6/10 Pain location: R knee  Pain description: burning  Aggravating factors: weather/barometric pressure change, WB Relieving factors: movement (but not too much WB)   PRECAUTIONS: None  RED FLAGS: None   WEIGHT BEARING RESTRICTIONS: No  FALLS:  Has patient fallen in last 6 months? 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  DIAGNOSTIC FINDINGS:   Narrative & Impression CLINICAL DATA:  Bilateral knee pain.  Arthritis.   EXAM: LEFT KNEE - COMPLETE 4+ VIEW   COMPARISON:  None Available.   FINDINGS: Moderate medial current  joint space narrowing and peripheral osteophytosis. The lateral compartment joint space is maintained. Moderate to high-grade superior patellar and trochlear degenerative osteophytes. 14 mm ossicle posteromedial to the distal femoral metaphysis, likely a loose body. This is separate from the posterolateral fabella.   IMPRESSION: 1. 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 2. There is a 14 mm ossicle posteromedial to the distal femoral metaphysis, likely a loose body.  Narrative & Impression CLINICAL DATA:  Bilateral knee pain.  Arthritis.   EXAM: RIGHT KNEE - COMPLETE 4+ VIEW   COMPARISON:  Right knee radiographs 09/01/2022   FINDINGS: Mild to moderate lateral and mild medial compartment joint space narrowing and peripheral osteophytes.   Moderate to high-grade superior patellar degenerative spurring. No acute fracture or dislocation. No definite joint effusion.   8 mm mineralized density posterior to the distal femoral metaphysis seen medially on frontal view, likely a loose body.   IMPRESSION: 1. Mild-to-moderate lateral and mild medial compartment osteoarthritis. 2. Moderate to high-grade superior patellar degenerative spurring. 3. Probable 8 mm loose body posterior to the distal femoral metaphysis. This is similar to prior.  PATIENT SURVEYS:   LEFS 30/80  COGNITION: Overall cognitive status: Within functional limits for tasks assessed       MUSCLE LENGTH:  HS mild limitation B Piriformis OK     LOWER EXTREMITY ROM:  Active ROM Right eval Left eval Right 04/25/24  Hip flexion     Hip extension     Hip abduction     Hip adduction     Hip internal rotation     Hip external rotation     Knee flexion 114* 135* 131*  Knee extension -2* -6*   Ankle dorsiflexion     Ankle plantarflexion     Ankle inversion     Ankle eversion      (Blank rows = not tested)  LOWER EXTREMITY MMT:  MMT Right eval Left eval Right 05/17/24 Left  05/17/24  Hip flexion 3- 3- 4- 4-  Hip extension      Hip abduction 2 3- 4- 4-  Hip adduction      Hip internal rotation      Hip external rotation      Knee flexion 4- 4- 4+ 4+  Knee extension 3 3 4+ 4+  Ankle dorsiflexion      Ankle plantarflexion      Ankle inversion      Ankle eversion       (Blank rows = not tested)  TREATMENT DATE:    05/19/24  Nustep L5x8 minutes BLEs only seat 7  SLRs 2# x10 B SLRs + ER 2#  x10 B Single leg bridges 2x5 B Sidelying hip hikes forward/backward circles 2x10 B Percussion gun for about 3-4 minutes R calf  Gastroc stretches slant board 4x30 seconds  HS stretches with strap 3x30 seconds B       PATIENT EDUCATION:  Education details: exam findings, POC, HEP  Person educated: Patient Education method: Programmer, multimedia, Demonstration, and Handouts Education comprehension: verbalized understanding, returned demonstration, and needs further education  HOME EXERCISE PROGRAM:  Access Code: [email or web address] URL: [email or web address] Date: 05/04/2024 Prepared by: Josette Rough  Exercises - Seated Knee Extension with Anchored Resistance  - 1 x daily - 7 x weekly - 1-2 sets - 5-10 reps - Seated Knee Flexion with Anchored Resistance  - 1 x daily - 7 x weekly - 1-2 sets - 5-10 reps - Supine Active Straight Leg Raise  - 1 x daily - 7 x weekly - 1-2 sets - 5 reps - Supine Bridge with Resistance Band  - 1 x daily - 7 x weekly - 1-2 sets - 10 reps - Sidelying Hip Abduction  - 1 x daily - 7 x weekly - 1-2 sets - 5-10 reps - Sit to Stand with Resistance Around Legs  - 1 x daily - 7 x weekly - 1-2 sets - 10 reps - Standing Hip Hiking  - 1 x daily - 7 x weekly - 1-2 sets - 10 reps  ASSESSMENT:  CLINICAL IMPRESSION:   Arrives today having more pain due to incoming weather today- we took it easier and even tried  percussion gun to R calf to try to help reduce pain. She only has one session left before leaving for her trip [place], hopefully she will be feeling better by that time (and weather will be OK too) so we can assess/adjust final HEP.     EVAL: Patient is a 58 y.o. F who was seen today for physical therapy evaluation and treatment for  Diagnosis  M25.569 (ICD-10-CM) - Knee pain, unspecified chronicity, unspecified laterality  . Of note, she is preparing for a trip to [facility] in mid-August and would like to be able to walk as desired with minimal knee pain. She is critically weak in knee and hip musculature bilaterally, which would also explain why her back has been painful as well here recently. Still awaiting results from testing to r/o autoimmune disease in her presentation. Hopeful that we will be able to improve her pain prior to her trip to [facility].   OBJECTIVE IMPAIRMENTS: Abnormal gait, decreased activity tolerance, decreased balance, decreased mobility, difficulty walking, decreased ROM, decreased strength, and pain.   ACTIVITY LIMITATIONS: sitting, standing, squatting, stairs, transfers, and locomotion level  PARTICIPATION LIMITATIONS: driving, shopping, and community activity  PERSONAL FACTORS: Fitness, Past/current experiences, and Time since onset of injury/illness/exacerbation are also affecting patient's functional outcome.   REHAB POTENTIAL: Fair limited time before trip to [place], possible autoimmune involvement (still awaiting testing)  CLINICAL DECISION MAKING: Evolving/moderate complexity  EVALUATION COMPLEXITY: Moderate   GOALS: Goals reviewed with patient? No  SHORT TERM GOALS: Target date: 05/11/2024   Will be compliant with appropriate progressive HEP  Baseline: Goal status: INITIAL  2.  R knee flexion ROM to be equal to that of L  Baseline:  Goal status: INITIAL    LONG TERM GOALS: Target date: 06/01/2024    MMT to be at least 4/5 in  all  tested groups  Baseline:  Goal status: INITIAL  2.  B knee pain to be no more than 4/10 at worst  Baseline:  Goal status: INITIAL  3.  Will be able to ambulate community distances with pain no more than 4/10, LRAD Baseline:  Goal status: INITIAL  4.  Will be compliant with appropriate advanced HEP that she can keep up with while travelling to help avoid pain flare during her trip  Baseline:  Goal status: INITIAL  5.  LEFS to have improved by 15 points  Baseline:  Goal status: INITIAL     PLAN:  PT FREQUENCY: 2x/week  PT DURATION: 6 weeks  PLANNED INTERVENTIONS: 97750- Physical Performance Testing, 97110-Therapeutic exercises, 97530- Therapeutic activity, [id number]- Neuromuscular re-education, 97535- Self Care, 02859- Manual therapy, [id number]- Gait training, [id number]- Vasopneumatic device, and 97033- Ionotophoresis 4mg /ml Dexamethasone  PLAN FOR NEXT SESSION: DC and finalize HEP next visit   Josette Rough, PT, DPT 05/19/24 2:27 PM

## 2024-05-20 ENCOUNTER — Ambulatory Visit: Admitting: Family Medicine

## 2024-05-20 ENCOUNTER — Encounter: Payer: Self-pay | Admitting: Family Medicine

## 2024-05-20 VITALS — BP 128/78 | HR 76 | Temp 98.0°F | Resp 16 | Ht 63.0 in | Wt 170.0 lb

## 2024-05-20 DIAGNOSIS — M62830 Muscle spasm of back: Secondary | ICD-10-CM | POA: Diagnosis not present

## 2024-05-20 DIAGNOSIS — M79641 Pain in right hand: Secondary | ICD-10-CM

## 2024-05-20 DIAGNOSIS — E1169 Type 2 diabetes mellitus with other specified complication: Secondary | ICD-10-CM | POA: Diagnosis not present

## 2024-05-20 DIAGNOSIS — M9904 Segmental and somatic dysfunction of sacral region: Secondary | ICD-10-CM | POA: Diagnosis not present

## 2024-05-20 DIAGNOSIS — Z7984 Long term (current) use of oral hypoglycemic drugs: Secondary | ICD-10-CM | POA: Diagnosis not present

## 2024-05-20 DIAGNOSIS — G894 Chronic pain syndrome: Secondary | ICD-10-CM | POA: Diagnosis not present

## 2024-05-20 DIAGNOSIS — Z7184 Encounter for health counseling related to travel: Secondary | ICD-10-CM

## 2024-05-20 DIAGNOSIS — M79642 Pain in left hand: Secondary | ICD-10-CM

## 2024-05-20 DIAGNOSIS — E669 Obesity, unspecified: Secondary | ICD-10-CM

## 2024-05-20 DIAGNOSIS — M9903 Segmental and somatic dysfunction of lumbar region: Secondary | ICD-10-CM | POA: Diagnosis not present

## 2024-05-20 DIAGNOSIS — Z6830 Body mass index (BMI) 30.0-30.9, adult: Secondary | ICD-10-CM

## 2024-05-20 DIAGNOSIS — M5416 Radiculopathy, lumbar region: Secondary | ICD-10-CM | POA: Diagnosis not present

## 2024-05-20 MED ORDER — ACETAZOLAMIDE 125 MG PO TABS: ORAL_TABLET | ORAL | 0 refills | Status: DC

## 2024-05-20 MED ORDER — TIZANIDINE HCL 4 MG PO TABS
4.0000 mg | ORAL_TABLET | Freq: Every day | ORAL | 3 refills | Status: AC
Start: 2024-05-20 — End: ?

## 2024-05-20 MED ORDER — NORTRIPTYLINE HCL 10 MG PO CAPS: 10.0000 mg | ORAL_CAPSULE | Freq: Every day | ORAL | 2 refills | Status: AC

## 2024-05-20 MED ORDER — PREDNISONE 20 MG PO TABS: 40.0000 mg | ORAL_TABLET | Freq: Every day | ORAL | 0 refills | Status: AC

## 2024-05-20 NOTE — Progress Notes (Signed)
 Chief Complaint  Patient presents with   Pain    Body aches    Subjective: Patient is a 58 y.o. female here for aches.  Patient has a history of chronic myalgias.  She has been on amitriptyline , duloxetine , and venlafaxine .  She is currently taking pregabalin  75 mg twice daily as needed which does seem to help on an as-needed basis.  No injury or change in activity.  She tries to stay active.  She will take Zanaflex  which does help.  Meloxicam  daily as needed.  She has a family history of fibromyalgia.  She has bilateral hand pain with associated swelling.  It is worse in the morning and gets better throughout the day.  Affected equally on both sides over her knuckles and proximal joints of her fingers.  No redness or bruising.  No catching/locking.  Autoimmune labs unremarkable.  History of diabetes.  Does not check sugars.  Diet is fair, tries to walk for exercise.  She is going to hike a portion of [employer].  She would like something for altitude sickness.  Past Medical History:  Diagnosis Date   Diabetes mellitus without complication (HCC)    Hypertension    NAFLD (nonalcoholic fatty liver disease)    Thyroid  disease     Objective: BP 128/78 (BP Location: Left Arm, Patient Position: Sitting)   Pulse 76   Temp 98 F (36.7 C) (Oral)   Resp 16   Ht 5' 3 (1.6 m)   Wt 170 lb (77.1 kg)   SpO2 98%   BMI 30.11 kg/m  General: Awake, appears stated age Heart: RRR, no LE edema Lungs: CTAB, no rales, wheezes or rhonchi. No accessory muscle use Psych: Age appropriate judgment and insight, normal affect and mood  Assessment and Plan: Chronic pain syndrome - Plan: tiZANidine  (ZANAFLEX ) 4 MG tablet, predniSONE  (DELTASONE ) 20 MG tablet, nortriptyline (PAMELOR) 10 MG capsule  Bilateral hand pain - Plan: predniSONE  (DELTASONE ) 20 MG tablet, Ambulatory referral to Rheumatology  Type 2 diabetes mellitus with obesity (HCC) - Plan: Comprehensive metabolic panel with GFR, Lipid panel,  Hemoglobin A1c, Microalbumin / creatinine urine ratio  Travel advice encounter - Plan: acetaZOLAMIDE (DIAMOX) 125 MG tablet  Chronic, unstable. Cont Zanaflex , Lyrica , start nortriptyline 10 mg/d. I will see her when she gets back from [place], around mid September.  Heat, ice, Tylenol . Refer to rheumatology for their opinion.  5-day prednisone  burst 40 mg daily.  She will send me a message in 5 days with how she is doing.  If she does not improve at all, would hold off on scheduling a rheumatology appointment. Orders as above.  Since retiring, her sugars have been significantly better.  Continue Mounjaro  10 mg weekly, metformin  XR 500 mg twice daily Diamox 125 mg twice daily starting the day before she arrives at her destination of higher altitude.  Continue for 2 to 4 days. The patient voiced understanding and agreement to the plan.  Mabel Mt [place], DO 05/20/24  1:30 PM

## 2024-05-20 NOTE — Patient Instructions (Signed)
 Give us  2-3 business days to get the results of your labs back.   Keep the diet clean and stay active.  Heat (pad or rice pillow in microwave) over affected area, 10-15 minutes twice daily.   Ice/cold pack over area for 10-15 min twice daily.  OK to take Tylenol  1000 mg (2 extra strength tabs) or 975 mg (3 regular strength tabs) every 6 hours as needed.  No meloxicam  while on the prednisone .  Send me a message in 5 d with how you are doing after the prednisone .   If you do not hear anything about your referral in the next 1-2 weeks, call our office and ask for an update.  Let us  know if you need anything.

## 2024-05-23 ENCOUNTER — Ambulatory Visit (HOSPITAL_BASED_OUTPATIENT_CLINIC_OR_DEPARTMENT_OTHER)
Admission: RE | Admit: 2024-05-23 | Discharge: 2024-05-23 | Disposition: A | Discharge status: Home / Self Care | Source: Ambulatory Visit | Attending: Family Medicine | Admitting: Family Medicine

## 2024-05-23 ENCOUNTER — Encounter (HOSPITAL_BASED_OUTPATIENT_CLINIC_OR_DEPARTMENT_OTHER): Payer: Self-pay

## 2024-05-23 DIAGNOSIS — Z1231 Encounter for screening mammogram for malignant neoplasm of breast: Secondary | ICD-10-CM | POA: Diagnosis not present

## 2024-05-26 DIAGNOSIS — M9904 Segmental and somatic dysfunction of sacral region: Secondary | ICD-10-CM | POA: Diagnosis not present

## 2024-05-26 DIAGNOSIS — M9903 Segmental and somatic dysfunction of lumbar region: Secondary | ICD-10-CM | POA: Diagnosis not present

## 2024-05-26 DIAGNOSIS — M5416 Radiculopathy, lumbar region: Secondary | ICD-10-CM | POA: Diagnosis not present

## 2024-05-26 DIAGNOSIS — M62830 Muscle spasm of back: Secondary | ICD-10-CM | POA: Diagnosis not present

## 2024-05-27 DIAGNOSIS — M62830 Muscle spasm of back: Secondary | ICD-10-CM | POA: Diagnosis not present

## 2024-05-27 DIAGNOSIS — M9904 Segmental and somatic dysfunction of sacral region: Secondary | ICD-10-CM | POA: Diagnosis not present

## 2024-05-27 DIAGNOSIS — M9903 Segmental and somatic dysfunction of lumbar region: Secondary | ICD-10-CM | POA: Diagnosis not present

## 2024-05-27 DIAGNOSIS — M5416 Radiculopathy, lumbar region: Secondary | ICD-10-CM | POA: Diagnosis not present

## 2024-05-30 ENCOUNTER — Ambulatory Visit: Attending: Family Medicine | Admitting: Physical Therapy

## 2024-05-30 ENCOUNTER — Encounter: Payer: Self-pay | Admitting: Physical Therapy

## 2024-05-30 DIAGNOSIS — R262 Difficulty in walking, not elsewhere classified: Secondary | ICD-10-CM | POA: Diagnosis not present

## 2024-05-30 DIAGNOSIS — M6281 Muscle weakness (generalized): Secondary | ICD-10-CM | POA: Insufficient documentation

## 2024-05-30 DIAGNOSIS — M25562 Pain in left knee: Secondary | ICD-10-CM | POA: Insufficient documentation

## 2024-05-30 DIAGNOSIS — M25561 Pain in right knee: Secondary | ICD-10-CM | POA: Diagnosis not present

## 2024-05-30 DIAGNOSIS — G8929 Other chronic pain: Secondary | ICD-10-CM | POA: Insufficient documentation

## 2024-05-30 NOTE — Therapy (Signed)
 OUTPATIENT PHYSICAL THERAPY LOWER EXTREMITY DISCHARGE    Patient Name: Lindsey Owen MRN: [id number] DOB:May 17, 1966, 58 y.o., female Today's Date: 05/30/2024  PHYSICAL THERAPY DISCHARGE SUMMARY  Visits from Start of Care: 7  Current functional level related to goals / functional outcomes: See below    Remaining deficits: See below    Education / Equipment: See below    Patient agrees to discharge. Patient goals were met. Patient is being discharged due to meeting the stated rehab goals.   END OF SESSION:  PT End of Session - 05/30/24 1545     Visit Number 7    Number of Visits 7    Date for PT Re-Evaluation 06/01/24    Authorization Type [employer]    Authorization Time Period 04/20/24 to 06/01/24    PT Start Time 1519    PT Stop Time 1557    PT Time Calculation (min) 38 min    Activity Tolerance Patient tolerated treatment well    Behavior During Therapy WFL for tasks assessed/performed                Past Medical History:  Diagnosis Date   Diabetes mellitus without complication (HCC)    Hypertension    NAFLD (nonalcoholic fatty liver disease)    Thyroid  disease    Past Surgical History:  Procedure Laterality Date   CERVICAL SPINE SURGERY     CESAREAN SECTION  10/20/1998   UTERINE FIBROID SURGERY     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   NAFLD (nonalcoholic fatty liver disease)    DDD (degenerative disc disease), lumbar 02/27/2020   Low back pain with radiation 09/09/2019   Arthritis 06/01/2019   Type 2 diabetes mellitus with obesity (HCC) 09/10/2018    Essential hypertension 03/19/2015   Hypothyroidism 03/19/2015    PCP: Frann Mabel Mt, DO  REFERRING PROVIDER: Frann Mabel Mt, DO  REFERRING DIAG:  Diagnosis  M25.569 (ICD-10-CM) - Knee pain, unspecified chronicity, unspecified laterality    THERAPY DIAG:  Chronic pain of both knees  Difficulty in walking, not elsewhere classified  Muscle weakness (generalized)  Rationale for Evaluation and Treatment: Rehabilitation  ONSET DATE: 12 years ago  SUBJECTIVE:   SUBJECTIVE STATEMENT:   Things are going well, have been walking as much as I can. Really busy right now getting ready for the trip, will be gone for 3.5 weeks. Started going to stretch zone, its ok but feel they aren't aggressive enough with the stretches.   EVAL: I think I have arthritis, they hurt a lot, there's a lot of weakness and stiffness. Sometimes they make noises. Sometimes stairs are difficult. Had PT before in [place] in 2014 or 2015, helped for awhile but then this came back. Having a chiropractor work on my back right now, at first we thought the back was causing the knee pain but then he told me to try PT because it two different issues. Pain R knee radiates up and down leg, less on L. I'm going to [facility] on August 18th, [place] it [place] it will be lots of walking   PERTINENT HISTORY:  See above  PAIN:  Are you having pain? Yes: NPRS scale: 4/10 Pain location: R knee  Pain description: inflammation feeling, burning, occasional numbness/tingling  Aggravating factors: weather/barometric pressure change, staying too still  Relieving factors: movement   PRECAUTIONS: None  RED FLAGS: None   WEIGHT BEARING RESTRICTIONS: No  FALLS:  Has patient fallen in last 6 months? 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  DIAGNOSTIC FINDINGS:   Narrative & Impression CLINICAL DATA:  Bilateral knee pain.  Arthritis.   EXAM: LEFT KNEE - COMPLETE 4+ VIEW   COMPARISON:  None Available.   FINDINGS: Moderate medial current joint space narrowing and peripheral osteophytosis. The lateral compartment joint space is maintained. Moderate to high-grade superior patellar and trochlear degenerative osteophytes. 14 mm ossicle posteromedial to the distal femoral metaphysis, likely a loose body. This is separate from the posterolateral fabella.   IMPRESSION: 1. 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 2. There is a 14 mm ossicle posteromedial to the distal femoral metaphysis, likely a loose body.  Narrative & Impression CLINICAL DATA:  Bilateral knee pain.  Arthritis.   EXAM: RIGHT KNEE - COMPLETE 4+ VIEW   COMPARISON:  Right knee radiographs 09/01/2022   FINDINGS: Mild to moderate lateral and mild medial compartment joint space narrowing and peripheral osteophytes.   Moderate to high-grade superior patellar degenerative spurring. No acute fracture or dislocation. No definite joint effusion.   8 mm mineralized density posterior to the distal femoral metaphysis seen medially on frontal view, likely a loose body.   IMPRESSION: 1. Mild-to-moderate lateral and mild medial compartment osteoarthritis. 2. Moderate to high-grade superior patellar degenerative spurring. 3. Probable 8 mm loose body posterior to the distal femoral metaphysis. This is similar to prior.  PATIENT SURVEYS:   LEFS 30/80; 05/30/24 48/80  COGNITION: Overall  cognitive status: Within functional limits for tasks assessed       MUSCLE LENGTH:  HS mild limitation B Piriformis OK     LOWER EXTREMITY ROM:  Active ROM Right eval Left eval Right 04/25/24 Right 05/30/24 Left 05/30/24  Hip flexion       Hip extension       Hip abduction       Hip adduction       Hip internal rotation       Hip external rotation       Knee flexion 114* 135* 131* 145* 142*  Knee extension -2* -6*  -3* -7*  Ankle dorsiflexion       Ankle plantarflexion       Ankle inversion       Ankle eversion        (Blank rows = not tested)  LOWER EXTREMITY MMT:  MMT Right eval Left eval Right 05/17/24 Left 05/17/24 Right 05/30/24 Left 05/30/24  Hip flexion 3- 3- 4- 4- 4+ 4+  Hip extension        Hip abduction 2 3- 4- 4- 4+ 4+  Hip adduction  Hip internal rotation        Hip external rotation        Knee flexion 4- 4- 4+ 4+ 4+ 4+  Knee extension 3 3 4+ 4+ 4+ 4+  Ankle dorsiflexion        Ankle plantarflexion        Ankle inversion        Ankle eversion         (Blank rows = not tested)                                                                                                                                   TREATMENT DATE:     05/30/24  MMT, LEFS, ROM, goal check    Nustep L5x8 minutes BLEs only   Forward step ups 6 inch step (power up/eccentric lower down) x12 B STS with 7# in U hand 2x10 (switched hands between rounds) 3 way taps blue foam pad x10 B Hip hikes + swing x10 B   05/19/24  Nustep L5x8 minutes BLEs only seat 7  SLRs 2# x10 B SLRs + ER 2#  x10 B Single leg bridges 2x5 B Sidelying hip hikes forward/backward circles 2x10 B Percussion gun for about 3-4 minutes R calf  Gastroc stretches slant board 4x30 seconds  HS stretches with strap 3x30 seconds B       PATIENT EDUCATION:  Education details: exam findings, POC, HEP  Person educated: Patient Education method: Programmer, multimedia, Demonstration, and Handouts Education  comprehension: verbalized understanding, returned demonstration, and needs further education  HOME EXERCISE PROGRAM:  Access Code: [email or web address] URL: [email or web address] Date: 05/04/2024 Prepared by: Josette Rough  Exercises - Seated Knee Extension with Anchored Resistance  - 1 x daily - 7 x weekly - 1-2 sets - 5-10 reps - Seated Knee Flexion with Anchored Resistance  - 1 x daily - 7 x weekly - 1-2 sets - 5-10 reps - Supine Active Straight Leg Raise  - 1 x daily - 7 x weekly - 1-2 sets - 5 reps - Supine Bridge with Resistance Band  - 1 x daily - 7 x weekly - 1-2 sets - 10 reps - Sidelying Hip Abduction  - 1 x daily - 7 x weekly - 1-2 sets - 5-10 reps - Sit to Stand with Resistance Around Legs  - 1 x daily - 7 x weekly - 1-2 sets - 10 reps - Standing Hip Hiking  - 1 x daily - 7 x weekly - 1-2 sets - 10 reps  ASSESSMENT:  CLINICAL IMPRESSION:   Arrives today feeling much better, HEP is working well for her at this point so I didn't change anything. She has made excellent progress with skilled services thus far, all goals are met at this time. DC for now, we are happy to see her back with new referral if she continues to have pain after her trip.     EVAL: Patient  is a 58 y.o. F who was seen today for physical therapy evaluation and treatment for  Diagnosis  M25.569 (ICD-10-CM) - Knee pain, unspecified chronicity, unspecified laterality  . Of note, she is preparing for a trip to [facility] in mid-August and would like to be able to walk as desired with minimal knee pain. She is critically weak in knee and hip musculature bilaterally, which would also explain why her back has been painful as well here recently. Still awaiting results from testing to r/o autoimmune disease in her presentation. Hopeful that we will be able to improve her pain prior to her trip to [facility].   OBJECTIVE IMPAIRMENTS: Abnormal gait, decreased activity tolerance, decreased balance, decreased  mobility, difficulty walking, decreased ROM, decreased strength, and pain.   ACTIVITY LIMITATIONS: sitting, standing, squatting, stairs, transfers, and locomotion level  PARTICIPATION LIMITATIONS: driving, shopping, and community activity  PERSONAL FACTORS: Fitness, Past/current experiences, and Time since onset of injury/illness/exacerbation are also affecting patient's functional outcome.   REHAB POTENTIAL: Fair limited time before trip to [place], possible autoimmune involvement (still awaiting testing)  CLINICAL DECISION MAKING: Evolving/moderate complexity  EVALUATION COMPLEXITY: Moderate   GOALS: Goals reviewed with patient? No  SHORT TERM GOALS: Target date: 05/11/2024   Will be compliant with appropriate progressive HEP  Baseline: Goal status: MET 05/30/24  2.  R knee flexion ROM to be equal to that of L  Baseline:  Goal status: MET 05/30/24    LONG TERM GOALS: Target date: 06/01/2024    MMT to be at least 4/5 in all tested groups  Baseline:  Goal status: MET 05/30/24  2.  B knee pain to be no more than 4/10 at worst  Baseline:  Goal status: MET 05/30/24   3.  Will be able to ambulate community distances with pain no more than 4/10, LRAD Baseline:  Goal status: MET 05/30/24  4.  Will be compliant with appropriate advanced HEP that she can keep up with while travelling to help avoid pain flare during her trip  Baseline:  Goal status: MET 05/30/24  5.  LEFS to have improved by 15 points  Baseline:  Goal status: MET 05/30/24     PLAN:  PT FREQUENCY: 2x/week  PT DURATION: 6 weeks  PLANNED INTERVENTIONS: 97750- Physical Performance Testing, 97110-Therapeutic exercises, 97530- Therapeutic activity, 97112- Neuromuscular re-education, 97535- Self Care, [id number]- Manual therapy, 97116- Gait training, [phone]- Vasopneumatic device, and 97033- Ionotophoresis 4mg /ml Dexamethasone  PLAN FOR NEXT SESSION: DC today   Josette Rough, PT, DPT 05/30/24 3:58 PM

## 2024-06-01 DIAGNOSIS — M9903 Segmental and somatic dysfunction of lumbar region: Secondary | ICD-10-CM | POA: Diagnosis not present

## 2024-06-01 DIAGNOSIS — M62830 Muscle spasm of back: Secondary | ICD-10-CM | POA: Diagnosis not present

## 2024-06-01 DIAGNOSIS — M9904 Segmental and somatic dysfunction of sacral region: Secondary | ICD-10-CM | POA: Diagnosis not present

## 2024-06-01 DIAGNOSIS — M5416 Radiculopathy, lumbar region: Secondary | ICD-10-CM | POA: Diagnosis not present

## 2024-06-01 DIAGNOSIS — M9902 Segmental and somatic dysfunction of thoracic region: Secondary | ICD-10-CM | POA: Diagnosis not present

## 2024-06-03 ENCOUNTER — Other Ambulatory Visit: Payer: Self-pay | Admitting: Family Medicine

## 2024-06-03 DIAGNOSIS — E039 Hypothyroidism, unspecified: Secondary | ICD-10-CM

## 2024-06-03 DIAGNOSIS — S46819A Strain of other muscles, fascia and tendons at shoulder and upper arm level, unspecified arm, initial encounter: Secondary | ICD-10-CM

## 2024-07-04 ENCOUNTER — Encounter

## 2024-07-04 ENCOUNTER — Ambulatory Visit: Admitting: Family Medicine

## 2024-07-20 ENCOUNTER — Other Ambulatory Visit: Payer: Self-pay | Admitting: Family Medicine

## 2024-07-20 DIAGNOSIS — I10 Essential (primary) hypertension: Secondary | ICD-10-CM

## 2024-07-26 DIAGNOSIS — I10 Essential (primary) hypertension: Secondary | ICD-10-CM | POA: Diagnosis not present

## 2024-07-26 DIAGNOSIS — K0889 Other specified disorders of teeth and supporting structures: Secondary | ICD-10-CM | POA: Diagnosis not present

## 2024-08-19 ENCOUNTER — Other Ambulatory Visit: Payer: Self-pay

## 2024-08-19 ENCOUNTER — Other Ambulatory Visit (HOSPITAL_BASED_OUTPATIENT_CLINIC_OR_DEPARTMENT_OTHER): Payer: Self-pay

## 2024-08-22 ENCOUNTER — Encounter: Payer: Self-pay | Admitting: Radiology

## 2024-08-26 ENCOUNTER — Other Ambulatory Visit: Payer: Self-pay | Admitting: Family Medicine

## 2024-08-26 DIAGNOSIS — E669 Obesity, unspecified: Secondary | ICD-10-CM

## 2024-09-03 ENCOUNTER — Other Ambulatory Visit: Payer: Self-pay | Admitting: Family Medicine

## 2024-09-03 DIAGNOSIS — E039 Hypothyroidism, unspecified: Secondary | ICD-10-CM

## 2024-09-05 ENCOUNTER — Ambulatory Visit: Admitting: Family Medicine

## 2024-09-05 ENCOUNTER — Encounter: Payer: Self-pay | Admitting: Family Medicine

## 2024-09-05 VITALS — BP 130/82 | HR 95 | Temp 98.0°F | Resp 16 | Ht 63.0 in | Wt 168.2 lb

## 2024-09-05 DIAGNOSIS — G8929 Other chronic pain: Secondary | ICD-10-CM

## 2024-09-05 DIAGNOSIS — M545 Low back pain, unspecified: Secondary | ICD-10-CM

## 2024-09-05 DIAGNOSIS — E039 Hypothyroidism, unspecified: Secondary | ICD-10-CM

## 2024-09-05 DIAGNOSIS — E2839 Other primary ovarian failure: Secondary | ICD-10-CM

## 2024-09-05 DIAGNOSIS — E669 Obesity, unspecified: Secondary | ICD-10-CM | POA: Diagnosis not present

## 2024-09-05 DIAGNOSIS — Z7985 Long-term (current) use of injectable non-insulin antidiabetic drugs: Secondary | ICD-10-CM

## 2024-09-05 DIAGNOSIS — I10 Essential (primary) hypertension: Secondary | ICD-10-CM

## 2024-09-05 DIAGNOSIS — Z7984 Long term (current) use of oral hypoglycemic drugs: Secondary | ICD-10-CM

## 2024-09-05 DIAGNOSIS — E119 Type 2 diabetes mellitus without complications: Secondary | ICD-10-CM

## 2024-09-05 MED ORDER — AZELASTINE HCL 0.1 % NA SOLN: 2.0000 | Freq: Two times a day (BID) | NASAL | 12 refills | Status: AC

## 2024-09-05 NOTE — Patient Instructions (Addendum)
 Give us  2-3 business days to get the results of your labs back.   Keep the diet clean and stay active.  If you do not hear anything about your referral in the next 1-2 weeks, call our office and ask for an update.  Foods that may reduce pain: 1) Ginger 2) Blueberries 3) Salmon 4) Pumpkin seeds 5) Dark chocolate 6) Turmeric 7) Tart cherries 8) Virgin olive oil 9) Chili peppers 10) Mint 11) Krill oil  Let us  know if you need anything.

## 2024-09-05 NOTE — Progress Notes (Signed)
 Subjective:   Chief Complaint  Patient presents with   Follow-up    Follow Up    Lindsey Owen is a 58 y.o. female here for follow-up of diabetes.   Portia's self monitored glucose range is 90-low 100's.  Patient denies hypoglycemic reactions. She checks her glucose levels intermittently Patient does not require insulin.   Medications include: Mounjaro  10 mg/week, metformin  XR 500 mg bid Diet is healthy.  Exercise: walking  Hypertension Patient presents for hypertension follow up. She does not monitor home blood pressures. She is compliant with medications- Zestoretic  20-12.5 mg/d. Patient has these side effects of medication: none Diet/exercise as above. No CP or SOB.   3 months the patient fell off of a horse in [place].  She hurt her left lower back.  She has been having pain since then and difficulty ambulating long distances.  She is requesting to see a specialist.  She has not been doing any stretches or exercises.  Past Medical History:  Diagnosis Date   Diabetes mellitus without complication (HCC)    Hypertension    NAFLD (nonalcoholic fatty liver disease)    Thyroid  disease      Related testing: Retinal exam: Done Pneumovax: Due  Objective:  BP 130/82 (BP Location: Left Arm, Patient Position: Sitting)   Pulse 95   Temp 98 F (36.7 C) (Oral)   Resp 16   Ht 5' 3 (1.6 m)   Wt 168 lb 3.2 oz (76.3 kg)   SpO2 96%   BMI 29.80 kg/m  General:  Well developed, well nourished, in no apparent distress Skin:  Warm, no pallor or diaphoresis Head:  Normocephalic, atraumatic Eyes:  Pupils equal and round, sclera anicteric without injection  Lungs:  CTAB, no access msc use Cardio:  RRR, no bruits, no LE edema Musculoskeletal:  Symmetrical muscle groups noted without atrophy or deformity Neuro:  Sensation intact to pinprick on feet Psych: Age appropriate judgment and insight  Assessment:   Type 2 diabetes mellitus in patient with obesity (HCC) - Plan:  Comprehensive metabolic panel with GFR, Lipid panel, Hemoglobin A1c, Microalbumin / creatinine urine ratio  Essential hypertension  Chronic left-sided low back pain without sciatica - Plan: Ambulatory referral to Orthopedic Surgery  Hypothyroidism, unspecified type - Plan: TSH, T4, free  Estrogen deficiency - Plan: DG Bone Density   Plan:   Chronic, stable.  Continue Mounjaro  10 mg weekly, metformin  XR 500 mg twice daily.  May be able to drop off the metformin  depending on how her numbers look today.  Counseled on diet and exercise. Chronic, stable.  Continue Zestoretic  20-12.5 mg daily. Refer to orthospine at her request.  Stretches and exercises provided.  Heat, ice, Tylenol . Follow-up on this. She requested a bone density scan.  She is of Asian descent and is greater than 25 years old.  We have ordered but I did caution her insurance may not cover this. F/u in 6 mo. The patient voiced understanding and agreement to the plan.  Mabel Mt [place], DO 09/05/24 4:55 PM

## 2024-09-06 ENCOUNTER — Ambulatory Visit: Payer: Self-pay | Admitting: Family Medicine

## 2024-09-06 LAB — COMPREHENSIVE METABOLIC PANEL WITH GFR
ALT: 15 U/L (ref 0–35)
AST: 22 U/L (ref 0–37)
Albumin: 4.3 g/dL (ref 3.5–5.2)
Alkaline Phosphatase: 55 U/L (ref 39–117)
BUN: 12 mg/dL (ref 6–23)
CO2: 30 meq/L (ref 19–32)
Calcium: 9.5 mg/dL (ref 8.4–10.5)
Chloride: 100 meq/L (ref 96–112)
Creatinine, Ser: 0.78 mg/dL (ref 0.40–1.20)
GFR: 82.43 mL/min (ref >60.00)
Glucose, Bld: 90 mg/dL (ref 70–99)
Potassium: 4.4 meq/L (ref 3.5–5.1)
Sodium: 136 meq/L (ref 135–145)
Total Bilirubin: 0.9 mg/dL (ref 0.2–1.2)
Total Protein: 7.4 g/dL (ref 6.0–8.3)

## 2024-09-06 LAB — T4, FREE: Free T4: 0.76 ng/dL (ref 0.60–1.60)

## 2024-09-06 LAB — MICROALBUMIN / CREATININE URINE RATIO
Creatinine,U: 18.2 mg/dL
Microalb Creat Ratio: UNDETERMINED mg/g (ref 0.0–30.0)
Microalb, Ur: <0.7 mg/dL

## 2024-09-06 LAB — LIPID PANEL
Cholesterol: 190 mg/dL (ref 0–200)
HDL: 57.4 mg/dL (ref >39.00)
LDL Cholesterol: 111 mg/dL — ABNORMAL HIGH (ref 0–99)
NonHDL: 132.31
Total CHOL/HDL Ratio: 3
Triglycerides: 109 mg/dL (ref 0.0–149.0)
VLDL: 21.8 mg/dL (ref 0.0–40.0)

## 2024-09-06 LAB — TSH: TSH: 1.47 u[IU]/mL (ref 0.35–5.50)

## 2024-09-06 LAB — HEMOGLOBIN A1C: Hgb A1c MFr Bld: 6.1 % (ref 4.6–6.5)

## 2024-09-07 DIAGNOSIS — Z1211 Encounter for screening for malignant neoplasm of colon: Secondary | ICD-10-CM | POA: Diagnosis not present

## 2024-09-18 ENCOUNTER — Other Ambulatory Visit: Payer: Self-pay | Admitting: Family Medicine

## 2024-09-18 DIAGNOSIS — E119 Type 2 diabetes mellitus without complications: Secondary | ICD-10-CM

## 2024-09-26 ENCOUNTER — Other Ambulatory Visit: Payer: Self-pay

## 2024-09-26 ENCOUNTER — Ambulatory Visit: Admitting: Physical Medicine and Rehabilitation

## 2024-09-26 ENCOUNTER — Encounter: Payer: Self-pay | Admitting: Physical Medicine and Rehabilitation

## 2024-09-26 DIAGNOSIS — M5416 Radiculopathy, lumbar region: Secondary | ICD-10-CM

## 2024-09-26 DIAGNOSIS — M7918 Myalgia, other site: Secondary | ICD-10-CM

## 2024-09-26 DIAGNOSIS — G8929 Other chronic pain: Secondary | ICD-10-CM

## 2024-09-26 MED ORDER — DIAZEPAM 5 MG PO TABS: ORAL_TABLET | ORAL | 0 refills | Status: AC

## 2024-09-26 NOTE — Progress Notes (Signed)
 Pain Scale   Average Pain 7 Patient advising she has lower back pain radiating to bilateral legs         +Driver, -BT, -Dye Allergies.

## 2024-09-26 NOTE — Progress Notes (Signed)
 Lindsey Owen - 58 y.o. female MRN [id number]  Date of birth: 1966-06-15  Office Visit Note: Visit Date: 09/26/2024 PCP: Frann Mabel Mt, DO Referred by: Frann Mabel Mt*  Subjective: Chief Complaint  Patient presents with   Lower Back - Pain   HPI: Lindsey Owen is a 58 y.o. female who comes in today per the request of Dr. Mabel Mt Frann for evaluation of chronic, worsening and severe bilateral lower back radiating down both legs. Numbness/tingling to both legs. Pain ongoing for several years. Her pain has become worse post fall in September. States she fell off horse during prayer trip. No significant injuries from this fall. Her back pain is most severe in the morning after waking up. Sitting and laying down to sleep cause most severe discomfort. Activity seems to help alleviate her pain. Some relief of pain with home exercise regimen, rest and use of medications. She is taking meloxicam , pregabalin , nortriptyline  and tizanidine . History of chiropractic treatments [facility]) with some relief of pain. Prior lumbar MRI imaging from 2023 shows mild-to-moderate left neural foraminal narrowing at L4-L5, with effacement of the left lateral recess, which likely compresses the descending left L5 nerve roots. Also mild right neural foraminal narrowing at L3-L4 and narrowing of the right lateral recess, which could affect the descending right L4 nerve roots. No high grade spinal canal stenosis.   She was previously treated by pain practice in [place], [place] where she underwent lumbar epidural steroid injections with significant relief of pain. No history of chronic pain management with narcotics.   Patient denies focal weakness, no bowel/bladder incontinence.      Review of Systems  Musculoskeletal:  Positive for back pain and myalgias.  Neurological:  Positive for tingling. Negative for focal weakness and weakness.  All other systems reviewed and are negative.   Otherwise per HPI.  Assessment & Plan: Visit Diagnoses:    ICD-10-CM   1. Lumbar radiculopathy  M54.16 XR Lumbar Spine 2-3 Views    2. Chronic bilateral low back pain with bilateral sciatica  M54.42    M54.41    G89.29     3. Myofascial pain syndrome  M79.18        Plan: Findings:  Chronic, worsening and severe bilateral lower back radiating down both legs. Paresthesias to bilateral lower extremities. Patient continues to have severe pain despite good conservative therapies such as chiropractic treatments, home exercise regimen, rest and use of medications. I obtained lumbar radiographs in the office today, there are multi level degenerative changes, most prominent at L3-L4 and L4-L5. No compression fractures noted. Patients clinical presentation and exam are complex, differentials include lumbar radiculopathy vs myofascial pain syndrome vs neuropathy. There is a significant amount of myofascial tenderness to bilateral lumbar paraspinal regions upon palpation. We discussed treatment plan in detail today. She received good relief of pain with prior lumbar epidural steroid injections. Next step is to place order for diagnostic and hopefully therapeutic L4-L5 interlaminar epidural steroid injection under fluoroscopic guidance. If good relief of pain with injection we can repeat this procedure infrequently as needed. I did call in pre-procedure Valium  for her to take on day of injection. Would also consider referral for short course of formal physical therapy, I do think she would benefit from manual treatments and dry needling. No red flag symptoms noted upon exam today.     Meds & Orders: No orders of the defined types were placed in this encounter.   Orders Placed This Encounter  Procedures  XR Lumbar Spine 2-3 Views    Follow-up: Return for L4-L5 interlaminar epidural steroid injection.   Procedures: No procedures performed      Clinical History: Narrative & Impression CLINICAL DATA:   Low back pain   EXAM: MRI LUMBAR SPINE WITHOUT CONTRAST   TECHNIQUE: Multiplanar, multisequence MR imaging of the lumbar spine was performed. No intravenous contrast was administered.   COMPARISON:  07/06/2013   FINDINGS: Segmentation:  Standard.   Alignment:  Levocurvature.  No listhesis.   Vertebrae: No acute fracture or suspicious osseous lesion. Endplate degenerative changes on the right at L3-L4, likely secondary to aforementioned curvature.   Conus medullaris and cauda equina: Conus extends to the L1 level. Conus and cauda equina appear normal.   Paraspinal and other soft tissues: No acute finding.   Disc levels:   T12-L1: No significant disc bulge. No spinal canal stenosis or neural foraminal narrowing.   L1-L2: No significant disc bulge. No spinal canal stenosis or neural foraminal narrowing.   L2-L3: Mild disc bulge, which has progressed from the prior exam. Mild facet arthropathy. No spinal canal stenosis or neural foraminal narrowing.   L3-L4: Mild disc bulge, which has progressed slightly from the prior exam. Narrowing of the right lateral recess and mild right neural foraminal narrowing, which are new from prior exam. No spinal canal stenosis.   L4-L5: Left eccentric disc bulge with left foraminal protrusion, with effacement of the left lateral recess, moderate left facet arthropathy, and mild-to-moderate left neural foraminal narrowing, all of which are new from the prior exam. No spinal canal stenosis. Mild left neural foraminal narrowing.   L5-S1: No significant disc bulge. Mild left facet arthropathy. No spinal canal stenosis or neural foraminal narrowing.   IMPRESSION: 1. L4-L5 mild-to-moderate left neural foraminal narrowing, with effacement of the left lateral recess, which likely compresses the descending left L5 nerve roots. 2. L3-L4 mild right neural foraminal narrowing and narrowing of the right lateral recess, which could affect the  descending right L4 nerve roots. 3. No spinal canal stenosis.     Electronically Signed   By: Donald Campion M.D.   On: 02/16/2022 22:49   She reports that she has never smoked. She has never used smokeless tobacco.  Recent Labs    03/28/24 1432 09/05/24 1438  HGBA1C  --  6.1  LABURIC 4.1  --     Objective:  VS:  HT:    WT:   BMI:     BP:   HR: bpm  TEMP: ( )  RESP:  Physical Exam Vitals and nursing note reviewed.  HENT:     Head: Normocephalic and atraumatic.     Right Ear: External ear normal.     Left Ear: External ear normal.     Nose: Nose normal.     Mouth/Throat:     Mouth: Mucous membranes are moist.  Eyes:     Extraocular Movements: Extraocular movements intact.  Cardiovascular:     Rate and Rhythm: Normal rate.     Pulses: Normal pulses.  Pulmonary:     Effort: Pulmonary effort is normal.  Abdominal:     General: Abdomen is flat. There is no distension.  Musculoskeletal:        General: Tenderness present.     Cervical back: Normal range of motion.     Comments: Patient is slow to rise from seated position to standing. Good lumbar range of motion. No pain noted with facet loading. 5/5 strength noted with bilateral hip  flexion, knee flexion/extension, ankle dorsiflexion/plantarflexion and EHL. No clonus noted bilaterally. No pain upon palpation of greater trochanters. No pain with internal/external rotation of bilateral hips. Sensation intact bilaterally. Myofascial tenderness noted upon palpation of bilateral lumbar paraspinal regions. Negative slump test bilaterally. Ambulates without aid, gait steady.     Skin:    General: Skin is warm and dry.     Capillary Refill: Capillary refill takes less than 2 seconds.  Neurological:     General: No focal deficit present.     Mental Status: She is alert and oriented to person, place, and time.  Psychiatric:        Mood and Affect: Mood normal.        Behavior: Behavior normal.     Ortho Exam  Imaging: XR  Lumbar Spine 2-3 Views Result Date: 09/26/2024 AP and lateral radiographs of lumbar spine show leftward curvature, there are multi level degenerative changes, most prominent at L3-L4 and L4-L5 where there is disc height loss and facet arthropathy. No spondylolisthesis. No fractures.    Past Medical/Family/Surgical/Social History: Medications & Allergies reviewed per EMR, new medications updated. 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   NAFLD (nonalcoholic fatty liver disease)    DDD (degenerative disc disease), lumbar 02/27/2020   Low back pain with radiation 09/09/2019   Arthritis 06/01/2019   Type 2 diabetes mellitus in patient with obesity (HCC) 09/10/2018   Essential hypertension 03/19/2015   Hypothyroidism 03/19/2015   Past Medical History:  Diagnosis Date   Diabetes mellitus without complication (HCC)    Hypertension    NAFLD (nonalcoholic fatty liver disease)    Thyroid  disease    Family History  Problem Relation Age of Onset   Kidney disease Mother        Kidney Failure   Heart disease Father        MI   Past Surgical History:  Procedure Laterality Date   CERVICAL SPINE SURGERY     CESAREAN SECTION  10/20/1998   UTERINE FIBROID SURGERY     Social History   Occupational History   Not on file  Tobacco Use   Smoking status: Never   Smokeless tobacco: Never  Vaping Use   Vaping status: Never Used  Substance and Sexual Activity   Alcohol use: Never   Drug use: No   Sexual activity: Not on file

## 2024-10-04 ENCOUNTER — Encounter: Payer: Self-pay | Admitting: Family Medicine

## 2024-10-04 DIAGNOSIS — Z1211 Encounter for screening for malignant neoplasm of colon: Secondary | ICD-10-CM | POA: Diagnosis not present

## 2024-10-04 DIAGNOSIS — K635 Polyp of colon: Secondary | ICD-10-CM | POA: Diagnosis not present

## 2024-10-04 DIAGNOSIS — D123 Benign neoplasm of transverse colon: Secondary | ICD-10-CM | POA: Diagnosis not present

## 2024-10-04 LAB — HM COLONOSCOPY

## 2024-10-11 ENCOUNTER — Ambulatory Visit (HOSPITAL_BASED_OUTPATIENT_CLINIC_OR_DEPARTMENT_OTHER)
Admission: RE | Admit: 2024-10-11 | Discharge: 2024-10-11 | Disposition: A | Discharge status: Home / Self Care | Source: Ambulatory Visit | Attending: Family Medicine | Admitting: Family Medicine

## 2024-10-11 DIAGNOSIS — E2839 Other primary ovarian failure: Secondary | ICD-10-CM | POA: Diagnosis present

## 2024-10-14 ENCOUNTER — Other Ambulatory Visit: Payer: Self-pay | Admitting: Family Medicine

## 2024-10-14 DIAGNOSIS — E669 Obesity, unspecified: Secondary | ICD-10-CM

## 2024-10-14 DIAGNOSIS — I10 Essential (primary) hypertension: Secondary | ICD-10-CM

## 2024-10-27 ENCOUNTER — Other Ambulatory Visit: Payer: Self-pay

## 2024-10-27 ENCOUNTER — Ambulatory Visit: Admitting: Physical Medicine and Rehabilitation

## 2024-10-27 VITALS — BP 135/94 | HR 69

## 2024-10-27 DIAGNOSIS — M5416 Radiculopathy, lumbar region: Secondary | ICD-10-CM

## 2024-10-27 MED ORDER — METHYLPREDNISOLONE ACETATE 40 MG/ML IJ SUSP
40.0000 mg | Freq: Once | INTRAMUSCULAR | Status: AC
  Administered 2024-10-27: 40 mg

## 2024-10-27 NOTE — Procedures (Signed)
 Lumbar Epidural Steroid Injection - Interlaminar Approach with Fluoroscopic Guidance  Patient: Lindsey Owen      Date of Birth: 05-05-1966 MRN: [id number] PCP: Frann Mabel Mt, DO      Visit Date: 10/27/2024   Universal Protocol:     Consent Given By: the patient  Position: PRONE  Additional Comments: Vital signs were monitored before and after the procedure. Patient was prepped and draped in the usual sterile fashion. The correct patient, procedure, and site was verified.   Injection Procedure Details:   Procedure diagnoses: Lumbar radiculopathy [M54.16]   Meds Administered:  Meds ordered this encounter  Medications   methylPREDNISolone  acetate (DEPO-MEDROL ) injection 40 mg     Laterality: Left  Location/Site:  L4-5  Needle: 3.5 in., 20 ga. Tuohy  Needle Placement: Paramedian epidural  Findings:   -Comments: Excellent flow of contrast into the epidural space.  Procedure Details: Using a paramedian approach from the side mentioned above, the region overlying the inferior lamina was localized under fluoroscopic visualization and the soft tissues overlying this structure were infiltrated with 4 ml. of 1% Lidocaine without Epinephrine. The Tuohy needle was inserted into the epidural space using a paramedian approach.   The epidural space was localized using loss of resistance along with counter oblique bi-planar fluoroscopic views.  After negative aspirate for air, blood, and CSF, a 2 ml. volume of Isovue-250 was injected into the epidural space and the flow of contrast was observed. Radiographs were obtained for documentation purposes.    The injectate was administered into the level noted above.   Additional Comments:  The patient tolerated the procedure well Dressing: 2 x 2 sterile gauze and Band-Aid    Post-procedure details: Patient was observed during the procedure. Post-procedure instructions were reviewed.  Patient left the clinic in stable  condition.

## 2024-10-27 NOTE — Progress Notes (Signed)
 Pain Scale   Average Pain 6 Patient advising she has chronic lower back pain that is constant.        +Driver, -BT, -Dye Allergies.

## 2024-10-27 NOTE — Progress Notes (Signed)
 "  Lindsey Owen - 59 y.o. female MRN [id number]  Date of birth: 1966/09/06  Office Visit Note: Visit Date: 10/27/2024 PCP: Frann Mabel Mt, DO Referred by: Frann Mabel Mt, DO  Subjective: Chief Complaint  Patient presents with   Lower Back - Pain   HPI:  Lindsey Owen is a 59 y.o. female who comes in today at the request of Duwaine Pouch, FNP for planned Left L4-5 Lumbar Interlaminar epidural steroid injection with fluoroscopic guidance.  The patient has failed conservative care including home exercise, medications, time and activity modification.  This injection will be diagnostic and hopefully therapeutic.  Please see requesting physician notes for further details and justification.   ROS Otherwise per HPI.  Assessment & Plan: Visit Diagnoses:    ICD-10-CM   1. Lumbar radiculopathy  M54.16 XR C-ARM NO REPORT    Epidural Steroid injection    methylPREDNISolone  acetate (DEPO-MEDROL ) injection 40 mg      Plan: No additional findings.   Meds & Orders:  Meds ordered this encounter  Medications   methylPREDNISolone  acetate (DEPO-MEDROL ) injection 40 mg    Orders Placed This Encounter  Procedures   XR C-ARM NO REPORT   Epidural Steroid injection    Follow-up: Return for visit to requesting provider as needed.   Procedures: No procedures performed  Lumbar Epidural Steroid Injection - Interlaminar Approach with Fluoroscopic Guidance  Patient: Lindsey Owen      Date of Birth: January 28, 1966 MRN: [id number] PCP: Frann Mabel Mt, DO      Visit Date: 10/27/2024   Universal Protocol:     Consent Given By: the patient  Position: PRONE  Additional Comments: Vital signs were monitored before and after the procedure. Patient was prepped and draped in the usual sterile fashion. The correct patient, procedure, and site was verified.   Injection Procedure Details:   Procedure diagnoses: Lumbar radiculopathy [M54.16]   Meds Administered:  Meds  ordered this encounter  Medications   methylPREDNISolone  acetate (DEPO-MEDROL ) injection 40 mg     Laterality: Left  Location/Site:  L4-5  Needle: 3.5 in., 20 ga. Tuohy  Needle Placement: Paramedian epidural  Findings:   -Comments: Excellent flow of contrast into the epidural space.  Procedure Details: Using a paramedian approach from the side mentioned above, the region overlying the inferior lamina was localized under fluoroscopic visualization and the soft tissues overlying this structure were infiltrated with 4 ml. of 1% Lidocaine without Epinephrine. The Tuohy needle was inserted into the epidural space using a paramedian approach.   The epidural space was localized using loss of resistance along with counter oblique bi-planar fluoroscopic views.  After negative aspirate for air, blood, and CSF, a 2 ml. volume of Isovue-250 was injected into the epidural space and the flow of contrast was observed. Radiographs were obtained for documentation purposes.    The injectate was administered into the level noted above.   Additional Comments:  The patient tolerated the procedure well Dressing: 2 x 2 sterile gauze and Band-Aid    Post-procedure details: Patient was observed during the procedure. Post-procedure instructions were reviewed.  Patient left the clinic in stable condition.   Clinical History: Narrative & Impression CLINICAL DATA:  Low back pain   EXAM: MRI LUMBAR SPINE WITHOUT CONTRAST   TECHNIQUE: Multiplanar, multisequence MR imaging of the lumbar spine was performed. No intravenous contrast was administered.   COMPARISON:  07/06/2013   FINDINGS: Segmentation:  Standard.   Alignment:  Levocurvature.  No listhesis.   Vertebrae: No acute fracture  or suspicious osseous lesion. Endplate degenerative changes on the right at L3-L4, likely secondary to aforementioned curvature.   Conus medullaris and cauda equina: Conus extends to the L1 level. Conus and  cauda equina appear normal.   Paraspinal and other soft tissues: No acute finding.   Disc levels:   T12-L1: No significant disc bulge. No spinal canal stenosis or neural foraminal narrowing.   L1-L2: No significant disc bulge. No spinal canal stenosis or neural foraminal narrowing.   L2-L3: Mild disc bulge, which has progressed from the prior exam. Mild facet arthropathy. No spinal canal stenosis or neural foraminal narrowing.   L3-L4: Mild disc bulge, which has progressed slightly from the prior exam. Narrowing of the right lateral recess and mild right neural foraminal narrowing, which are new from prior exam. No spinal canal stenosis.   L4-L5: Left eccentric disc bulge with left foraminal protrusion, with effacement of the left lateral recess, moderate left facet arthropathy, and mild-to-moderate left neural foraminal narrowing, all of which are new from the prior exam. No spinal canal stenosis. Mild left neural foraminal narrowing.   L5-S1: No significant disc bulge. Mild left facet arthropathy. No spinal canal stenosis or neural foraminal narrowing.   IMPRESSION: 1. L4-L5 mild-to-moderate left neural foraminal narrowing, with effacement of the left lateral recess, which likely compresses the descending left L5 nerve roots. 2. L3-L4 mild right neural foraminal narrowing and narrowing of the right lateral recess, which could affect the descending right L4 nerve roots. 3. No spinal canal stenosis.     Electronically Signed   By: Donald Campion M.D.   On: 02/16/2022 22:49     Objective:  VS:  HT:    WT:   BMI:     BP:(!) 135/94  HR:69bpm  TEMP: ( )  RESP:  Physical Exam Vitals and nursing note reviewed.  Constitutional:      General: She is not in acute distress.    Appearance: Normal appearance. She is not ill-appearing.  HENT:     Head: Normocephalic and atraumatic.     Right Ear: External ear normal.     Left Ear: External ear normal.  Eyes:      Extraocular Movements: Extraocular movements intact.  Cardiovascular:     Rate and Rhythm: Normal rate.     Pulses: Normal pulses.  Pulmonary:     Effort: Pulmonary effort is normal. No respiratory distress.  Abdominal:     General: There is no distension.     Palpations: Abdomen is soft.  Musculoskeletal:        General: Tenderness present.     Cervical back: Neck supple.     Right lower leg: No edema.     Left lower leg: No edema.     Comments: Patient has good distal strength with no pain over the greater trochanters.  No clonus or focal weakness.  Skin:    Findings: No erythema, lesion or rash.  Neurological:     General: No focal deficit present.     Mental Status: She is alert and oriented to person, place, and time.     Sensory: No sensory deficit.     Motor: No weakness or abnormal muscle tone.     Coordination: Coordination normal.  Psychiatric:        Mood and Affect: Mood normal.        Behavior: Behavior normal.      Imaging: No results found. "

## 2024-11-13 ENCOUNTER — Other Ambulatory Visit: Payer: Self-pay | Admitting: Family Medicine

## 2024-11-13 DIAGNOSIS — E669 Obesity, unspecified: Secondary | ICD-10-CM
